# Patient Record
Sex: Female | Born: 1980 | Race: Black or African American | Hispanic: No | Smoking: Never smoker
Health system: Southern US, Community
[De-identification: ages and names within clinical notes are randomized; demographics above are authoritative.]

## PROBLEM LIST (undated history)

## (undated) DIAGNOSIS — K909 Intestinal malabsorption, unspecified: Secondary | ICD-10-CM

## (undated) DIAGNOSIS — E611 Iron deficiency: Secondary | ICD-10-CM

## (undated) DIAGNOSIS — F333 Major depressive disorder, recurrent, severe with psychotic symptoms: Principal | ICD-10-CM

## (undated) DIAGNOSIS — R1084 Generalized abdominal pain: Secondary | ICD-10-CM

## (undated) DIAGNOSIS — R109 Unspecified abdominal pain: Secondary | ICD-10-CM

## (undated) DIAGNOSIS — E617 Deficiency of multiple nutrient elements: Secondary | ICD-10-CM

## (undated) DIAGNOSIS — Z006 Encounter for examination for normal comparison and control in clinical research program: Secondary | ICD-10-CM

## (undated) DIAGNOSIS — Z9884 Bariatric surgery status: Secondary | ICD-10-CM

## (undated) DIAGNOSIS — F419 Anxiety disorder, unspecified: Secondary | ICD-10-CM

## (undated) DIAGNOSIS — A599 Trichomoniasis, unspecified: Secondary | ICD-10-CM

## (undated) DIAGNOSIS — R1111 Vomiting without nausea: Secondary | ICD-10-CM

## (undated) HISTORY — PX: CHOLECYSTECTOMY: SHX55

## (undated) HISTORY — PX: ABDOMINAL HYSTERECTOMY: SHX81

## (undated) HISTORY — PX: ECTOPIC PREGNANCY SURGERY: SHX613

## (undated) HISTORY — PX: BREAST SURGERY: SHX581

## (undated) HISTORY — PX: GASTRIC BYPASS: SHX52

---

## 2014-04-13 LAB — COMPREHENSIVE METABOLIC PANEL
ALT: 19 U/L (ref 12–78)
AST: 15 U/L (ref 15–37)
Albumin,Serum: 3.6 g/dL (ref 3.1–4.6)
Alkaline Phosphatase: 87 U/L (ref 45–117)
Anion Gap: 12
BUN: 9 mg/dL (ref 7–25)
CO2: 23 mmol/L (ref 21–32)
Calcium: 8.9 mg/dL (ref 8.2–10.1)
Chloride: 107 mmol/L (ref 98–109)
Creatinine: 1.04 mg/dL (ref 0.60–1.50)
EGFR IF NonAfrican American: >60 mL/min (ref >60)
Glucose: 102 mg/dL — ABNORMAL HIGH (ref 70–100)
Potassium: 4.1 mmol/L (ref 3.5–5.1)
Sodium: 142 mmol/L (ref 135–145)
Total Bilirubin: 0.6 mg/dL (ref 0.2–1.0)
Total Protein: 6.6 g/dL (ref 6.4–8.2)
eGFR African American: >60 mL/min (ref >60)

## 2014-04-13 LAB — LIPID PANEL
Chol/HDL Ratio: 3
Cholesterol: 137 mg/dL (ref <200)
HDL: 50 mg/dL (ref >40)
LDL ASSESSMENT: 72 mg/dL (ref <100)
Triglycerides: 75 mg/dL (ref <150)

## 2014-04-13 LAB — CBC
Hematocrit: 37.2 % (ref 35.0–47.0)
Hemoglobin: 12.1 g/dl (ref 11.7–16.0)
MCH: 28.1 pg (ref 26.0–34.0)
MCHC: 32.5 % (ref 32.0–36.0)
MCV: 86.5 fl (ref 79.0–98.0)
MPV: 9.4 fl (ref 7.4–10.4)
Platelets: 337 10*3/uL (ref 140–440)
RBC: 4.3 10*6/uL (ref 3.80–5.20)
RDW: 12.7 % (ref 11.5–14.5)
WBC: 6.5 10*3/uL (ref 3.6–10.7)

## 2014-04-13 LAB — FERRITIN: Ferritin: 57 ng/mL (ref 8–388)

## 2014-04-13 LAB — VITAMIN B12: Vitamin B-12: 325 pg/mL (ref 193–986)

## 2014-04-13 LAB — FOLATE: Folate: 74 ng/mL — ABNORMAL HIGH (ref 3.1–17.5)

## 2014-04-13 LAB — VITAMIN D 25 HYDROXY: Vit D, 25-Hydroxy: 14 ng/mL — ABNORMAL LOW (ref 30–80)

## 2014-04-13 LAB — MAGNESIUM: Magnesium: 2.1 mg/dL (ref 1.8–2.4)

## 2014-04-13 LAB — IRON: Iron: 61 ug/dL (ref 50–170)

## 2014-04-16 LAB — ZINC: ZINC, SERUM: 69 ug/dL (ref 60–120)

## 2014-09-12 ENCOUNTER — Ambulatory Visit
Admit: 2014-09-12 | Discharge: 2014-09-12 | Payer: PRIVATE HEALTH INSURANCE | Attending: Obstetrics & Gynecology | Primary: Student in an Organized Health Care Education/Training Program

## 2014-09-12 DIAGNOSIS — Z01419 Encounter for gynecological examination (general) (routine) without abnormal findings: Secondary | ICD-10-CM

## 2014-09-12 NOTE — Progress Notes (Signed)
Chief Complaint   Patient presents with   ??? Gynecologic Exam       No LMP recorded. Patient has had a hysterectomy.     History:    No past medical history on file.    Past Surgical History   Procedure Laterality Date   ??? Cesarean section  2005   ??? Hysterectomy  2012     [facility]   ??? Salpingectomy  2008     Ectopic Pregnancy   ??? Cholecystectomy  2006   ??? Gastric bypass surgery  2014   ??? Breast lumpectomy Left 2000     Benign       Family History   Problem Relation Age of Onset   ??? High Blood Pressure Mother    ??? Heart Disease Mother    ??? Thyroid Disease Mother    ??? High Blood Pressure Father    ??? Diabetes Father    ??? Mental Illness Father    ??? High Blood Pressure Sister    ??? Thyroid Disease Sister    ??? Mental Illness Sister    ??? Breast Cancer Other      Aunt       History     Social History   ??? Marital Status: Divorced     Spouse Name: N/A     Number of Children: N/A   ??? Years of Education: N/A     Social History Main Topics   ??? Smoking status: Never Smoker    ??? Smokeless tobacco: Never Used   ??? Alcohol Use: 0.0 oz/week     0 Not specified per week   ??? Drug Use: No   ??? Sexual Activity: None     Other Topics Concern   ??? None     Social History Narrative   ??? None       Allergies:    Allergies   Allergen Reactions   ??? Amoxicillin Swelling   ??? Pcn [Penicillins] Swelling       Medications:    No current outpatient prescriptions on file prior to visit.     No current facility-administered medications on file prior to visit.       HPI:    HPI Comments: Pt presents for an annual exam    Gynecologic Exam  Pertinent negatives include no abdominal pain, arthralgias, chest pain, chills, fatigue, fever, nausea or vomiting.       ROS:    Review of Systems   Constitutional: Negative for fever, chills, activity change, appetite change, fatigue and unexpected weight change.   Respiratory: Negative for apnea, shortness of breath and stridor.    Cardiovascular: Negative for chest pain and leg swelling.   Gastrointestinal: Positive for  constipation. Negative for nausea, vomiting, abdominal pain, diarrhea and abdominal distention.   Genitourinary: Positive for frequency and pelvic pain. Negative for dysuria, urgency, vaginal bleeding, vaginal discharge, difficulty urinating, menstrual problem and dyspareunia.   Musculoskeletal: Negative for back pain and arthralgias.   Allergic/Immunologic: Negative for environmental allergies and food allergies.   Neurological: Negative for facial asymmetry and speech difficulty.   Psychiatric/Behavioral: Negative for behavioral problems, confusion and agitation.     1 c/s gastric bypass  Physical exam:    Physical Exam   Constitutional: She is oriented to person, place, and time. She appears well-developed and well-nourished. No distress.   HENT:   Head: Normocephalic and atraumatic.   Eyes: Right eye exhibits no discharge. Left eye exhibits no discharge. No scleral icterus.  Neck: Normal range of motion. Neck supple. No JVD present. No tracheal deviation present. No thyromegaly present.   Cardiovascular: Normal rate.    Pulmonary/Chest: Effort normal. No stridor. No respiratory distress. She has no wheezes.   Abdominal: Soft. She exhibits no distension and no mass. There is no tenderness. There is no rebound and no guarding.   Genitourinary: Vagina normal. No breast swelling, tenderness, discharge or bleeding. There is no rash, tenderness, lesion or injury on the right labia. There is no rash, tenderness, lesion or injury on the left labia. Right adnexum displays no mass, no tenderness and no fullness. Left adnexum displays no mass, no tenderness and no fullness.   Musculoskeletal: Normal range of motion.   Lymphadenopathy:     She has no cervical adenopathy.   Neurological: She is alert and oriented to person, place, and time. She displays normal reflexes. No cranial nerve deficit. Coordination normal.   Skin: Skin is warm and dry. No rash noted. She is not diaphoretic. No erythema. No pallor.   Psychiatric:  She has a normal mood and affect. Her behavior is normal. Judgment and thought content normal.       Assessment and Plan:    Renea Eevelyn was seen today for gynecologic exam.    Diagnoses and associated orders for this visit:    Routine gynecological examination  - PAP SMEAR    Pelvic cramping  - US Non OB Transvaginal; Future      We discussed that the cramping is suprapubic.  She has it 3 times a day for a few months.  She is constipated .  She does urinate a lot.    Will get us and fu.  In the interim she willdo miralax,  If negative will get gu consult to rule out IC   Also we discussed that she could have a hernia where she points   No Follow-up on file.

## 2014-09-13 LAB — C.TRACHOMATIS N.GONORRHOEAE DNA

## 2014-09-17 ENCOUNTER — Encounter: Attending: Obstetrics & Gynecology | Primary: Student in an Organized Health Care Education/Training Program

## 2014-09-19 LAB — PAP SMEAR

## 2014-10-04 ENCOUNTER — Ambulatory Visit
Admit: 2014-10-04 | Discharge: 2014-10-04 | Payer: PRIVATE HEALTH INSURANCE | Attending: Obstetrics & Gynecology | Primary: Student in an Organized Health Care Education/Training Program

## 2014-10-04 ENCOUNTER — Ambulatory Visit
Admit: 2014-10-04 | Discharge: 2014-10-04 | Payer: PRIVATE HEALTH INSURANCE | Primary: Student in an Organized Health Care Education/Training Program

## 2014-10-04 DIAGNOSIS — R102 Pelvic and perineal pain: Secondary | ICD-10-CM

## 2014-10-04 NOTE — Progress Notes (Signed)
Chief Complaint   Patient presents with   ??? Follow-up     review us       No LMP recorded. Patient has had a hysterectomy.     History:    History reviewed. No pertinent past medical history.    Past Surgical History   Procedure Laterality Date   ??? Cesarean section  2005   ??? Hysterectomy  2012     [facility]   ??? Salpingectomy  2008     Ectopic Pregnancy   ??? Cholecystectomy  2006   ??? Gastric bypass surgery  2014   ??? Breast lumpectomy Left 2000     Benign       Family History   Problem Relation Age of Onset   ??? High Blood Pressure Mother    ??? Heart Disease Mother    ??? Thyroid Disease Mother    ??? High Blood Pressure Father    ??? Diabetes Father    ??? Mental Illness Father    ??? High Blood Pressure Sister    ??? Thyroid Disease Sister    ??? Mental Illness Sister    ??? Breast Cancer Other      Aunt       History     Social History   ??? Marital Status: Divorced     Spouse Name: N/A     Number of Children: N/A   ??? Years of Education: N/A     Social History Main Topics   ??? Smoking status: Never Smoker    ??? Smokeless tobacco: Never Used   ??? Alcohol Use: 0.0 oz/week     0 Not specified per week   ??? Drug Use: No   ??? Sexual Activity: None     Other Topics Concern   ??? None     Social History Narrative       Allergies:    Allergies   Allergen Reactions   ??? Amoxicillin Swelling   ??? Pcn [Penicillins] Swelling       Medications:    Current Outpatient Prescriptions on File Prior to Visit   Medication Sig Dispense Refill   ??? Multiple Vitamins-Minerals (MULTIVITAMIN PO) Take by mouth       No current facility-administered medications on file prior to visit.       HPI:    HPI Comments: Pt presents for us results . She had a small cyst on her right ovary ,  We discussed that her pain could be from inetrstitial cystitis or from a hernia.  Bowel movements are regular.  She will see dr ray bologna and if his workup is negative she will consult with a surgeon for hernia       ROS:    Review of Systems   Constitutional: Negative for fever, activity change,  appetite change, fatigue and unexpected weight change.   Genitourinary: Positive for pelvic pain. Negative for dysuria, urgency, frequency, flank pain, decreased urine volume, vaginal bleeding, vaginal discharge, enuresis, difficulty urinating and dyspareunia.       Physical exam:    Physical Exam   Constitutional: She is oriented to person, place, and time. She appears well-developed and well-nourished. No distress.   HENT:   Head: Normocephalic and atraumatic.   Eyes: Right eye exhibits no discharge. Left eye exhibits no discharge. No scleral icterus.   Pulmonary/Chest: Effort normal. No respiratory distress. She has no wheezes.   Abdominal: Soft. She exhibits no distension.   Neurological: She is alert and oriented to person,  place, and time.   Skin: Skin is warm and dry. No rash noted. She is not diaphoretic. No erythema. No pallor.   Psychiatric: She has a normal mood and affect. Her behavior is normal. Judgment and thought content normal.       Assessment and Plan:    [name] was seen today for follow-up.    Diagnoses and associated orders for this visit:    Pelvic cramping      S/p hyst . Normal us.  She will see dr Midge Averbologna to rule out ic  If negativce possible consult for hernia   No Follow-up on file.

## 2014-10-22 ENCOUNTER — Ambulatory Visit
Admit: 2014-10-22 | Discharge: 2014-10-22 | Payer: PRIVATE HEALTH INSURANCE | Attending: Surgery | Primary: Student in an Organized Health Care Education/Training Program

## 2014-10-22 DIAGNOSIS — R5383 Other fatigue: Secondary | ICD-10-CM

## 2014-10-22 NOTE — Progress Notes (Signed)
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 She denies nausea, vomiting, dysphagia, and reflux symptoms. Currently is not taking a PPI. Patient states diet and exercise is going well. She currently is  eating 65-75 gm/day protein, and is not compliant with prescribed multivitamins and supplements.  She is happy with her progress to date.  She is running regularly with a running group and is doing regular races to keep motivated.  She is doing strength training regularly.  She complains of a pulling sensation and pain in her right groin region x6 months, worse lately.  Unsure of what causes pain - can be while active or laying down.  Denies associated bulge.        Vital signs are stable. Labs were Not completed. All labs were: not completed      Physical Examination:   BP 125/81 mmHg   Pulse 73   Temp(Src) 97.6 ??F (36.4 ??C)   Resp 18   Ht 5' 4.5" (1.638 m)   Wt 209 lb 12.8 oz (95.165 kg)   BMI 35.47 kg/m2    General:   This patient is awake, alert, and oriented, and is in no apparent distress.    Cardiac:   Regular rate and rhythm without evidence of murmur.  Respiratory:   Clear to auscultation bilaterally.  Abdomen:   Soft, non-tender, non-distended without masses/ No evidence of abdominal hernia / Scars consistent with previous surgeries.  Right groin no evidence of inguinal or femoral hernia, no defects/abnl appreciated.   Head and Neck:  Supple  Extremities:   No cyanosis, clubbing or edema/ No calf tenderness/No restrictions of movement, is ambulatory without assistance.  Neurological:   Intact x 4 extremities, no focal deficits notes.  Skin:    No rashes or lesions noted.    No orders of the defined types were placed in this encounter.       Medications ordered during this encounter:   Outpatient Encounter Prescriptions as of 10/22/2014   Medication Sig Dispense Refill   ???  vitamin B-12 (CYANOCOBALAMIN) 1000 MCG tablet Place 1,000 mcg under the tongue once a week Indications: SUPPLEMENT     ??? zinc 50 MG CAPS Take by mouth Indications: SUPPLEMENT     ??? calcium carbonate (OSCAL) 500 MG TABS tablet Take 500 mg by mouth three times daily     ??? Multiple Vitamins-Minerals (MULTIVITAMIN PO) Take by mouth       No facility-administered encounter medications on file as of 10/22/2014.       Visit Diagnoses:   1. Other fatigue    2. SOB (shortness of breath)    3. Intestinal malabsorption, unspecified intestinal malabsorption    4. Deficiency of multiple nutrient elements    5. Deficiency of multiple vitamins      Plan:    1). Diet and Exercise: Continue current regimen.  Discussed with RD. Encouraged vitamin/supplement compliance.    2). Labs:  Not completed, orders given for draw today.   3). Psych concerns : No  4). Excessive skin concerns: Yes Refer to Dr. Odette HornsPedersen/Diulus at 18 month appointment - patient still actively losing weight.      Electronically signed by Margette FastLINDSAY BERBIGLIA, DO on 10/22/2014 at 10:37 AM      I personally supervised the Fellow in the evaluation and development of a treatment  plan for this patient. I personally interviewed the patient and performed a physical examination. In addition, I discussed the patient's condition and treatment options with them. I have also reviewed and agree with the past medical, family and social history unless otherwise noted. All of the patient's questions were answered.     Exercising well  No nausea  Will refer to plastic surgery at next visit

## 2014-10-22 NOTE — Progress Notes (Signed)
[facility]    > 6 MONTH POST-OPERATIVE DIETITIAN VISIT    Date: 10/22/2014    Patient's weight decreased by: 84 lbs     Maintenance diet reviewed with patient.       Patient does consume 5-6 small meals daily  Patient???s portions are adequate for current diet: ??-1 cup  Protein requirements discussed- currently consuming 60-80 grams/day grams daily.  Current protein sources: doing good, will monitor  Recommendations: will track      Fluid requirements discussed. Current Fluid Intake:  >64 oz/day  Patient  is  drinking sugar-free, caffeine-free and carbonation-free fluids only.    Patient is  waiting 30 minutes before and after meals to drink      Exercise activities discussed. Patient is  currently exercising. She was reminded that regular exercise is critical part of a successful outcome following weight loss surgery.       Behavioral/Emotional changes reviewed. Patient does feel comfortable with changes in eating behaviors and associated emotional changes. She was reminded that psychological counseling is available through the [facility] post-operatively.    The importance of vitamin supplements has been reviewed and the patient is taking the following:  -Multivitamin with minerals and iron  -Calcium  -Vitamin B12 (weekly)  -Vitamin D3  -Other:    Recent Nutrient Concerns and Vitamin Supplementation Changes: none- pt not taking vitamins    Notes/Comments: Pt is doing great overall! Pt to call as needed with any issues or concerns that arise.  Pt running 5 k's and 1/2 marathons      Visit completed by: Betsy CoderLaura Asna Muldrow

## 2014-10-22 NOTE — Progress Notes (Signed)
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 [phone])    Diet & Exercise Compliance:  Compliance with Recommended Eating Plan:  Eating 75g protein per day  yes    If no, reason:  Drinking at least 64 ounces of water per day no   If no, reason:Most days  Avoiding caffeine, added sugar, and carbonation no   If no, reason:using all    Compliance with Recommended Exercise Plan:  Exercising:    yes       If no, reason:  n/a      If yes:         Type:   hip hop class          Times per week:    3 times         Min per session:    60 minutes/day    Falls Risk Assessment  Patient does not not take medications which affect BP or mental status   Patient does notnot have newly prescribed or changed dosage of medications within past 30 days which affect BP or mental status  Patient has notfallen in the past 2 months  Patient does not demonstrate unsteady gait  Patient uses the following ambulatory assistive devices: n/a  Patient states the presence of the following traits which increases risk of fall: n/a    Patient is low risk for falls. If high or moderate risk, patient instructed not to ambulate independently in the Center, and cord for call light placed within reach of patient.    Pre-op Weight Metrics:  Surgical Weight Loss Tracker 10/16/2014   Consult Date 12/26/2012   Initial Weight 294 lb   Initial BMI 49.79   Ideal Body Weight 131 lb   Surgery Date 10/09/2013   Pre-Surgical Weight 294 lb   Pre Surgery BMI 49.79   Weight to Lose 163 lb       Post-op Weight Metrics:   %EBWL: % EBWL: 52%  Weight Change Since Last Visit: Weight Change: -2 lbs  Weight Change from Highest Pre-op Weight: Total Weight Change: -84.2 lbs    Labs Completed: no -   If no, patient instructed to get labs drawn today or ASAP    [facility]? N/A    If yes see Labs Tab    [facility]A    If yes see Encounters Tab - Orders only - Historical Provider - Date: n/a    Completed by: Merlyn AlbertLeighann Sanchez Hemmer

## 2015-01-06 NOTE — ED Provider Notes (Signed)
PATIENTSHARIAN, [name]       DOS:           01/06/2015  MR #:             [id number]             ACCOUNT #:     [id number]  DATE OF BIRTH:    04-30-1981              AGE:           34      HISTORY OF PRESENT ILLNESS:    PERTINENT HISTORY OF PRESENT  ILLNESS. Patient seen under the  supervision of  Dr. Maurine Minister DeJulius.  Patient presents complaining of right lower  dental pain.  This has been worsening over the past few days.  She does not have a  dentist.  She states that she has started to have a headache as well.  No  documented  fever or chills.    PERTINENT PAST/ FAMILY/SOCIAL HISTORY History of cholecystectomy  and  hysterectomy.  History of gastric bypass.        PHYSICAL EXAM Vital signs normal.  Patient is alert and oriented x3  and in no  acute distress.  Head is normocephalic.  Patient has right posterior  molar  tenderness to palpation.  No abscess.  No sublingual edema.  Airway is  patent.  Heart is a regular rate and rhythm.  Lungs are clear to auscultation  bilaterally.    MEDICAL DECISION MAKING:    SIGNIFICANT FINDINGS/ED COURSE/MEDICAL DECISION MAKING/TREATMENT  PLAN Patient  has right posterior molar tenderness on exam.  No abscess.  No  sublingual  edema.  Patient was treated with clindamycin and Norco due to her  history of  gastric bypass.  She will be given clindamycin and #8 Norco for home.  She was  given dental resource list and followup to the dental clinic.  Plan  was shared  with the patient and all questions were answered.  Patient discharged  in stable  condition.    PROBLEM LIST:       ED Diagnosis:     Pain, dental (K08.8): Entered Date: 06-Jan-2015 20:49, Entered By:  Heloise Ochoa CHASE, Status: Active, ICD-10: K20.8        ADDITIONAL INFORMATION If the physician assistant/nurse practitioner  was  involved in patient care, I personally performed and participated in  all the  above services (including HPI and PE). I have reviewed with  the  physician  assistant/nurse practitioner the history and confirmed the findings  with the  patient. I personally performed all surgical procedures in the medical  record  unless otherwise indicated.      COPIES SENT TO::     Odie Sera L(PCP): [id number]    Electronic Signatures:  Benson Norway (MD)  (Signed 06-Jan-2015 21:05)   Co-Signer: HISTORY OF PRESENT ILLNESS, PHYSICAL EXAM, MEDICAL  DECISION MAKING,  PROBLEM LIST, Additional Infomation, Copies to be sent to:  Heloise Ochoa CHASE (PA)  (Signed 06-Jan-2015 20:50)   Authored: HISTORY OF PRESENT ILLNESS, PHYSICAL EXAM, MEDICAL DECISION  MAKING,  PROBLEM LIST, Additional Infomation, Copies to be sent to:      Last Updated: 06-Jan-2015 21:05 by Benson Norway (MD)            Please see T-Sheet, initial assessment, and physician orders for  further details.    Dictating Physician: Dallas Breeding,  PA-C  Original Electronic Signature Date: 01/06/2015 08:50 P  SCD  Document #: [id number]    cc:  Odie Serahristina * Harner, DO

## 2015-01-07 ENCOUNTER — Inpatient Hospital Stay
Admit: 2015-01-07 | Discharge: 2015-01-07 | Disposition: A | Discharge status: Home / Self Care | Attending: Emergency Medicine

## 2015-02-17 NOTE — ED Provider Notes (Signed)
PATIENTMALIYA, [name]       DOS:           02/17/2015  MR #:             [id number]             ACCOUNT #:     [id number]  DATE OF BIRTH:    1981-03-26              AGE:           33      HISTORY OF PRESENT ILLNESS:    PERTINENT HISTORY OF PRESENT  ILLNESS. Patient seen with Dr.  Jacqualin Combes.  Patient presents with myalgias, diarrhea, nausea, vomiting and fever  of 102.7  for the past 3 days.  She was around a friend who is also sick.  She  has had  decreased p.o. over this period of time.  Patient did not get a flu  shot this  year.        PHYSICAL EXAM Vital Signs: reviewed and within expected limits  unless noted:  Temperature 102.3, heart rate 116  Gen:  WN, WD  Head: NC/AT  Eyes: pupils equal and reactive, EOMI  ENT:  mmm, no meningismus  Neck: Supple, nontender  Cardio: Tachycardic, no murmurs  Pulm: CTA-B, no rhonchi/wheezes/stridor, no distress  Abd: soft, non-tender, non-distended  Back: non-tender  Ext: symmetric pulses b/l UE/LE,  no edema, nontender  Skin: normal color, no rash  Neuro: alert and oriented x3. CNII-XII grossly intact. normal strength  and  sensation.    MEDICAL DECISION MAKING:    SIGNIFICANT FINDINGS/ED COURSE/MEDICAL DECISION MAKING/TREATMENT  PLAN Patient  given 2 L of fluids, Zofran and Tylenol.  On reevaluation she is  feeling much  improved.  Chest x-ray is negative for pneumonia.  Patient is outside  of the  Tamiflu window and considered low risk.  She will be discharged with  Norco and  Zofran.  I discussed aggressive fluid hydration. Patient will be  discharged  home in stable condition.  We discussed signs and symptoms which would  warrant  a return to the emergency department.  All questions were answered.  Patient is  agreeable with this course.    PROBLEM LIST:       ED Diagnosis:     Influenza-like symptoms (R68.89): Entered Date: 17-Feb-2015 19:46,  Entered  By: Roxan Hockey, Vergia Chea D, Status: Active, ICD-10: R68.89        ADDITIONAL INFORMATION The Emergency  Medicine attending physician  was present  in the Emergency Department, who reviewed case management, and  approved  evaluation/treatment. Dragon dictation software was used when creating  this  note; there may be translation errors.      COPIES SENT TO::     Odie Sera L(PCP): [id number]    Electronic Signatures:  Benson Norway (MD)  (Signed 17-Feb-2015 19:47)   Co-Signer: HISTORY OF PRESENT ILLNESS, PHYSICAL EXAM, MEDICAL  DECISION MAKING,  PROBLEM LIST, Additional Infomation, Copies to be sent to:  Aaliyah Cancro D (DO)  (Signed 17-Feb-2015 19:46)   Authored: HISTORY OF PRESENT ILLNESS, PHYSICAL EXAM, MEDICAL DECISION  MAKING,  PROBLEM LIST, Additional Infomation, Copies to be sent to:      Last Updated: 17-Feb-2015 19:47 by Benson Norway (MD)            Please see T-Sheet, initial assessment, and physician orders for  further details.    Dictating Physician:  Thomasena EdisEric Othman Masur, DO  Original Electronic Signature Date: 02/17/2015 07:46 P  ER  Document #: [id number]    cc:  Odie Serahristina * Harner, DO

## 2015-04-15 LAB — FOLATE: Folate: 10.4 ng/mL (ref 3.1–17.5)

## 2015-04-15 LAB — CBC
Hematocrit: 39 % (ref 35.0–47.0)
Hemoglobin: 12.6 g/dL (ref 11.7–16.0)
MCH: 28.7 pg (ref 26.0–34.0)
MCHC: 32.2 % (ref 32.0–36.0)
MCV: 89.1 fL (ref 79.0–98.0)
MPV: 9.8 fL (ref 7.4–10.4)
Platelets: 273 10*3/uL (ref 140–440)
RBC: 4.38 10*6/uL (ref 3.80–5.20)
RDW: 13.4 % (ref 11.5–14.5)
WBC: 4.1 10*3/uL (ref 3.6–10.7)

## 2015-04-15 LAB — VITAMIN B12: Vitamin B-12: 223 pg/mL (ref 193–986)

## 2015-04-15 LAB — LIPID PANEL
Cholesterol: 170 mg/dL (ref <200)
HDL: 3 NA
HDL: 65 mg/dL — ABNORMAL HIGH (ref 40–59)
LDL Cholesterol: 87 mg/dL (ref <100)
Triglycerides: 89 mg/dL (ref <150)

## 2015-04-15 LAB — MAGNESIUM: Magnesium: 2.2 mg/dL (ref 1.8–2.4)

## 2015-04-15 LAB — FERRITIN: Ferritin: 31 ng/mL (ref 8–252)

## 2015-04-16 LAB — VITAMIN D 25 HYDROXY: Vit D, 25-Hydroxy: <13 ng/mL — ABNORMAL LOW (ref 30–80)

## 2015-04-18 LAB — ZINC: ZINC, SERUM: 66 ug/dL (ref 60–120)

## 2015-04-30 ENCOUNTER — Encounter: Attending: Internal Medicine | Primary: Student in an Organized Health Care Education/Training Program

## 2015-07-02 NOTE — ED Provider Notes (Signed)
PATIENTSHARDAY, [name]       DOS:           07/02/2015  MR #:             [id number]             ACCOUNT #:     [id number]  DATE OF BIRTH:    01-18-1981              AGE:           34      HISTORY OF PRESENT ILLNESS:    PERTINENT HISTORY OF PRESENT  ILLNESS. Patient presents with lower  pelvic  cramping she has had for several months.  She has not been seen by her  primary  provider or her GYN physician.  She denies any vaginal discharge or  bleeding.  She has had a prior hysterectomy.  She is does describe some dysuria  and  urinary frequency, no hematuria.  She has some mild nausea at times  but not  currently.  She appears well and in no acute distress.  She has no  other  complaints.  She denies any new recent sexual partners.    PERTINENT PAST/ FAMILY/SOCIAL HISTORY Hysterectomy        PHYSICAL EXAM Patient appears in no acute distress.  Neck is  supple.  Cardiovascular exam is regular rate and rhythm without murmurs.  Respiratory  exam is clear to auscultation bilaterally.  Abdomen was soft,  nontender, and  nondistended.  Extremities are nontender without edema.  Neurological  exam was  grossly unremarkable without lateralizing signs.  Remainder of  physical  examination is unremarkable.    MEDICAL DECISION MAKING:    SIGNIFICANT FINDINGS/ED COURSE/MEDICAL DECISION MAKING/TREATMENT  PLAN Patient  was given Toradol injection here with improvement of pain.  Abdominal  examination is benign.  Urinalysis not consistent with infection.  HCG  was  negative.  I feel patient should follow up with her primary provider  her GYN to  obtain outpatient ultrasound for further evaluation of her symptoms  which have  been going on for several months.  I do not feel she has any evidence  of acute  abdominal or pelvic surgical process at this time.  She will be given  prescription for Norco and return with any new or worsening symptoms.  Patient  understands and is agreeable with this plan of care.  Patient  was  discharged  home in stable condition.    PROBLEM LIST:       ED Diagnosis:     Pelvic pain (R10.2): Entered Date: 02-Jul-2015 22:33, Entered By:  Theodoro Kos, Status: Active, ICD-10: R10.2       Admit Reason:     pelvic pain: Entered Date: 02-Jul-2015 21:19, Entered By:  Standley Brooking, Status: Active        DIAGNOSIS Pelvic pain        ADDITIONAL INFORMATION This report was transcribed using computer  voice  recognition software.  Effort was made to ensure accuracy, however,  inadvertent  transcription errors may occur.      COPIES SENT TO::     Odie Sera L(PCP): [id number]    Electronic Signatures:  Theodoro Kos (MD)  (Signed 02-Jul-2015 22:33)   Authored: HISTORY OF PRESENT ILLNESS, PHYSICAL EXAM, MEDICAL DECISION  MAKING,  PROBLEM LIST, DIAGNOSIS, Additional Infomation, Copies to be sent to:  Last Updated: 02-Jul-2015 22:33 by Theodoro Kos (MD)            Please see T-Sheet, initial assessment, and physician orders for  further details.    Dictating Physician: Theodoro Kos, MD  Original Electronic Signature Date: 07/02/2015 10:33 P  CMF  Document #: [id number]    cc:  Odie Sera, DO

## 2015-07-03 ENCOUNTER — Inpatient Hospital Stay: Admit: 2015-07-03 | Discharge: 2015-07-03 | Disposition: A | Discharge status: Home / Self Care

## 2015-07-03 LAB — URINALYSIS
Bilirubin, Urine: NEGATIVE NA
Glucose, Ur: NEGATIVE mg/dL
Ketones, Urine: NEGATIVE mg/dL
LEUKOCYTES, UA: NEGATIVE NA
Nitrite, Urine: NEGATIVE NA
Occult Blood,Urine: NEGATIVE {RBC}/uL
Specific Gravity, Urine: 1.015 NA (ref 1.005–1.030)
Total Protein, Urine: NEGATIVE mg/dL
Urobilinogen, Urine: 4 mg/dL (ref 0–1)
pH, Urine: 7 NA (ref 5.0–8.0)

## 2015-07-03 LAB — PREGNANCY, URINE: HCG Urine: NEGATIVE NA

## 2015-07-03 NOTE — ED Provider Notes (Signed)
[name]:          [name], [name]       DOS:           07/02/2015  MR #:             [id number]             ACCOUNT #:     [id number]  DATE OF BIRTH:    1981-06-11              AGE:           34      HISTORY OF PRESENT ILLNESS:    PERTINENT HISTORY OF PRESENT  ILLNESS. This pt was seen and  evaluated with  Dr. Primitivo GauzeFletcher.  Patient presents to Emergency Department with pelvic pain for the past  2  months.  She describes it as cramping in nature and nothing makes it  better or  worse.  She denies vaginal bleeding, discharge, dysuria, hematuria.  She does  have urinary frequency.  She is sexually active with a single partner.  She had  a hysterectomy in 2012.  She denies chills, sweats, fever, vomiting,  diarrhea.  She has no point with OB/GYN for September 27.  [facility] to coming here.  Patient had an  unremarkable  urinalysis and was given Toradol for her pain.  She was instructed to  follow up  with OB/GYN for an ultrasound.  The patient came here because she is  wanting to  get blood work.    PERTINENT PAST/ FAMILY/SOCIAL HISTORY PMH: denies  PSH: Hysterectomy, gastric bypass  social hx: Tobacco use        PHYSICAL EXAM Gen: Well nourished, well developed, afebrile, no  acute  distress  HEENT: Normocephalic, atraumatic, moist mucous membranes, supple neck  CVS: Regular rate and rhythm  Resp: No acute respiratory distress, clear to auscultation  bilaterally, no  rhonchi/wheezing/rales  Abd: Soft, nontender, nondistended, normal bowel sounds  Back: no CVA tenderness, no spinal tenderness  Ext: No trauma, no edema  Neuro: Alert and oriented.  No focal deficits    MEDICAL DECISION MAKING:    SIGNIFICANT FINDINGS/ED COURSE/MEDICAL DECISION MAKING/TREATMENT  PLAN Patient  was [place].  At  that time she had an unremarkable urinalysis and was given Toradol.  She was  also given Norco for home and told to  follow-up with OB/GYN for an  ultrasound.  This was a patient that there is no need for blood work at this time.  She has  been having this pain for over 2 months and denies vaginal bleeding  and  discharge.  Patient discharged home in stable condition and told to  follow-up  with OB/GYN as planned.    Patient's pain has been going on for 2 months.  This time I see no  indication  for emergent ultrasound.  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  I  explained to her that this likely could be a cyst but she does not  appear to  have symptoms of torsion.  I do not feel like she needs an emergent  ultrasound.   If she had ovarian cyst on ultrasound her treatment would be the  same.    PROBLEM LIST:       ED Diagnosis:     Pelvic pain (R10.2): Entered Date: 03-Jul-2015  00:07, Entered By:  Otilio Connors, Status: Active, ICD-10: R10.2        DIAGNOSIS pelvic pain        ADDITIONAL INFORMATION If the physician assistant/nurse practitioner  was  involved in patient care, I personally performed and participated in  all the  above services (including HPI and PE). I have reviewed with the  physician  assistant/nurse practitioner the history and confirmed the findings  with the  patient. I personally performed all surgical procedures in the medical  record  unless otherwise indicated.      COPIES SENT TO::     Odie Sera L(PCP): [id number]    Electronic Signatures:  Cheri Rous (MD)  (Signed 03-Jul-2015 00:36)   Authored: MEDICAL DECISION MAKING   Co-Signer: HISTORY OF PRESENT ILLNESS, PROBLEM LIST, Copies to be  sent to:  Otilio Connors (PA)  (Signed 03-Jul-2015 00:07)   Authored: HISTORY OF PRESENT ILLNESS, PHYSICAL EXAM, MEDICAL DECISION  MAKING,  PROBLEM LIST, DIAGNOSIS, Additional Infomation, Copies to be sent to:      Last Updated: 03-Jul-2015 00:36 by Cheri Rous (MD)            Please see Warner-Sheet, initial assessment, and physician orders for  further details.    Dictating  Physician: Aida Raider, PA  Original Electronic Signature Date: 07/03/2015 12:03 A  MS  Document #: [id number]    cc:  Odie Sera, DO

## 2015-07-18 ENCOUNTER — Telehealth

## 2015-07-18 NOTE — Telephone Encounter (Signed)
Patient needs labwork order for 24 month LRYGB visit.  Will send to patient in the mail.    Thanks,    [employer]

## 2015-07-18 NOTE — Telephone Encounter (Signed)
Mailed patient labwork orders.    [name]

## 2015-07-18 NOTE — Telephone Encounter (Signed)
Signed.

## 2015-09-03 ENCOUNTER — Ambulatory Visit
Admit: 2015-09-03 | Discharge: 2015-09-03 | Payer: BLUE CROSS/BLUE SHIELD | Attending: Internal Medicine | Primary: Student in an Organized Health Care Education/Training Program

## 2015-09-03 DIAGNOSIS — E617 Deficiency of multiple nutrient elements: Secondary | ICD-10-CM

## 2015-09-03 NOTE — Progress Notes (Signed)
[facility]  SURGICAL WEIGHT LOSS MANAGEMENT PROGRAM    MEDICAL WEIGHT LOSS MANAGEMENT FOLLOWING SURGICAL WEIGHT LOSS  INITIAL EVALUATION      Patient: [name]          Service Date: 09/03/15         Date of Birth: Oct 08, 1981      Patient History/Assessment Summary:  The patient is a pleasant  34 y.o. year old female, who stands Height: 5' 4.5" (163.8 cm) (bcc)  tall with a weight of Weight: 212 lb 3.2 oz (96.3 kg) pounds, resulting in a BMI of Body mass index is 35.86 kg/(m^2). kg/m2. She has been overweight for 30 years, and who has previously undergone weight loss surgery. She is now seeking medical weight loss management following weight loss surgery to address struggles with regain and/or to lose additional weight. Based upon BMI, she  is currently experiencing Obesity.     This patient???s excess weight is causing:Degenerative Joint Disease (DJD)    This patient is alone for the evaluation today.    ROS:  I reviewed page 5 of the New Patient Assessment form with the patient, which is located in the Media Manager tab      History:    Past Medical History   Diagnosis Date   ??? Anemia    ??? Chronic back pain    ??? Deficiency of multiple nutrient elements    ??? Deficiency of multiple vitamins    ??? Fatigue    ??? GERD (gastroesophageal reflux disease)    ??? Intestinal malabsorption    ??? Irregular menstrual cycle    ??? Knee pain    ??? SOB (shortness of breath)      Past Surgical History   Procedure Laterality Date   ??? Cesarean section  2005   ??? Hysterectomy  2012     LAVH   ??? Salpingectomy  2008     Ectopic Pregnancy   ??? Cholecystectomy  2006   ??? Gastric bypass surgery  2014     [id number]   ??? Breast lumpectomy Left 2000     Benign     Family History   Problem Relation Age of Onset   ??? High Blood Pressure Mother    ??? Heart Disease Mother    ??? Thyroid Disease Mother    ??? High Blood Pressure Father    ??? Diabetes Father    ??? Mental Illness Father    ??? High Blood Pressure Sister    ??? Thyroid Disease Sister     ??? Mental Illness Sister    ??? Breast Cancer Other      Aunt     Social History   Substance Use Topics   ??? Smoking status: Never Smoker   ??? Smokeless tobacco: Never Used   ??? Alcohol use 0.0 oz/week     0 Standard drinks or equivalent per week       Diagnoses:     Diagnosis Stable/Unstable Currently being Managed by:   Anxiety     Arthritis     Asthma     Back Pain     COPD     Depression     Diabetes Mellitus     Fatigue     GERD     Hair Loss     Heart Burn     Hyperlipidemia     Hypertension     Hypothyroid     Joint Pain     Obstructive Sleep Apnea  Shortness of Breath     Other: malabsorption stable Dr Jamie Katoharner        Physical Examination:    Visit Vitals   ??? BP 122/80   ??? Pulse 78   ??? Resp 16   ??? Ht 5' 4.5" (1.638 m)  Comment: bcc   ??? Wt 212 lb 3.2 oz (96.3 kg)   ??? BMI 35.86 kg/m2       Weight Metrics:  Ideal Body Weight:    Excess Body Weight:    Ideal BMI: 28    General: This patient is alert and oriented X3    Physical Examination:   Visit Vitals   ??? BP 122/80   ??? Pulse 78   ??? Resp 16   ??? Ht 5' 4.5" (1.638 m)  Comment: bcc   ??? Wt 212 lb 3.2 oz (96.3 kg)   ??? BMI 35.86 kg/m2       General:   This patient is Non-obese, and is in no apparent distress.    Cardiac:   Regular rate and rhythm without evidence of murmur  Respiratory:   Clear to auscultation bilaterally; No evidence of respiratory distress  Ears,Nose,Mouth, Throat:  Ears and nose symmetrical in appearance and without lesions or masses; Lips, teeth, and gums WNL  Abdomen:   Soft, non-tender, non-distended without masses/ No evidence of abdominal hernia / Incisions consistent with previous surgeries.  Head and Neck:  Obese, normocephalic and atraumatic/soft and supple; no lymphadenopathy or obvious bruits; no thyromegaly.  Musculoskeletal:  No cyanosis, clubbing or edema/ No calf tenderness/No restrictions of movement, is ambulatory without assistance; does not present with back pain, without limitation of movement  Neurological:   Intact x 4  extremities, no focal deficits notes.  Skin:    Warm and dry; No rashes or lesions noted.   Rectal:   Deferred  Psychological: Patient is awake, alert and oriented to person, place and time     Patient's mood is Appropriate      Current Diet     This patient???s current diet GN:FAOZis:high in sugar and high in wheat    Reviewed  CURRENT DIET HISTORY with patient and provided a notebook for them to record their meals on a daily basis. Instructed patient on methods for documenting meals.    Her diet contains inadequate amounts of protein, inadequate amounts of healthy fats, adequate amounts of green, leafy vegetables, and adequate amounts of fruits.    Her comfort foods include:sweets and breads    Current Activity  This patient currently does exercise for  16-30 min per session,  1-2 times per week, including the following:  walking.    Current Eating Behaviors  Reviewed the Impact on Life Questionnaire with patient; in which she describes the impact of obesity on her life. (in Careers information officerMedia Manager)      This patients demonstrates the following behaviors as they relate to her eating:eats at night    She eats approximately 1-2 times per day.  Her last meal/snack was at 8 AM.    Plan:    Advised patient that continued physician-supervised weight loss management is recommended at this time to work on weight loss.    Physician Diet Recommendations given to patient    In addition, she has been advised to:  1. Attend all physician follow up appointments - bring completed food diary to first physician follow up appointment        2.  Schedule FU visit in 2 month(s)  Goals:  3 month weight goal: 15  6 month weight goal: 30  12 month weight goal: 30     Consultations:    Other  Consultations:None    [   ]  Psychological evaluation    [   ]  Dietitian appointment      Current Meds  Patient's Medications   New Prescriptions    No medications on file   Previous Medications    CALCIUM CARBONATE (OSCAL) 500 MG TABS TABLET    Take 500 mg by  mouth three times daily    MULTIPLE VITAMINS-MINERALS (MULTIVITAMIN PO)    Take by mouth    VITAMIN B-12 (CYANOCOBALAMIN) 1000 MCG TABLET    Place 1,000 mcg under the tongue once a week Indications: SUPPLEMENT    ZINC 50 MG CAPS    Take by mouth Indications: SUPPLEMENT   Modified Medications    No medications on file   Discontinued Medications    No medications on file

## 2015-09-03 NOTE — Progress Notes (Signed)
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 [phone]    Patient has the following questions: NONE    Diet & Exercise Compliance:  Compliance with Recommended Eating Plan:  Eating 75g protein per day  no    If no, reason: 40G   Drinking at least 64 ounces of water per day no   If no, reason:20 G  Avoiding caffeine, added sugar, and carbonation no   If no, reason: CAFFEINE, ADDED SUGAR, AND CARBONATION     Compliance with Recommended Exercise Plan:  Exercising:    yes       If no, reason:        If yes:         Type:   SOME CLASSES, JUST JOINED THE GYM          Times per week:    twice         Min per session:    45 minutes/day    Falls Risk Assessment  Patient does not  take medications which affect BP or mental status   Patient does not have newly prescribed or changed dosage of medications within past 30 days which affect BP or mental status  Patient has not fallen in the past 2 months; DOES HAVE SOME DIZZINESS   Patient does not demonstrate unsteady gait  Patient uses the following ambulatory assistive devices: NONE  Patient states the presence of the following traits which increases risk of fall: NONE    Patient is low risk for falls. If high or moderate risk, patient instructed not to ambulate independently in the Center, and cord for call light placed within reach of patient.    Pre-op Weight Metrics:  Date of Initial Surgical Consultation: Consult Date: 12/26/12  Initial Weight: Initial Weight: 294 lb (133.4 kg)  Initial BMI: Initial BMI: 49.79  Ideal Body Weight: Ideal Body Weight: 131 lb (59.4 kg)  Highest Pre-Op Weight: Pre-Surgical Weight: 294 lb (133.4 kg)  Highest Pre-Op BMI: Pre Surgery BMI: 49.79  Excess Body Weight: Weight to Lose: 163 lb    Post-op Weight Metrics:   %EBWL: % EBWL: 50%  Weight Change Since Last Visit: Weight Change:  2.4 lbs  Weight Change from Highest Pre-op Weight: Total Weight Change: -81.8 lbs    Labs Completed: no -   If NO, patient instructed to get labs drawn today or ASAP    If YES:    [facility]A    If yes see Labs Tab    [facility]? N[facility]A    If yes see Encounters Tab - Orders only - Historical Provider - Date:  LABS PRINTED AND GIVEN TO PATIENT         Completed by: Lorane GellMarlene Deral Schellenberg

## 2015-09-07 LAB — COMPREHENSIVE METABOLIC PANEL
ALT: 14 U/L (ref 12–78)
AST: 10 U/L — ABNORMAL LOW (ref 15–37)
Albumin,Serum: 3.3 g/dL — ABNORMAL LOW (ref 3.4–5.0)
Alkaline Phosphatase: 70 U/L (ref 45–117)
Anion Gap: 8 NA
BUN: 11 mg/dL (ref 7–25)
CO2: 25 mmol/L (ref 21–32)
Calcium: 8.3 mg/dL (ref 8.2–10.1)
Chloride: 109 mmol/L (ref 98–109)
Creatinine: 0.83 mg/dL (ref 0.55–1.40)
EGFR IF NonAfrican American: >60 mL/min (ref >60)
Glucose: 78 mg/dL (ref 70–100)
Potassium: 4.2 mmol/L (ref 3.5–5.1)
Sodium: 142 mmol/L (ref 135–145)
Total Bilirubin: 0.4 mg/dL (ref 0.2–1.0)
Total Protein: 6.4 g/dL (ref 6.4–8.2)
eGFR African American: >60 mL/min (ref >60)

## 2015-09-07 LAB — LIPID PANEL
Chol/HDL Ratio: 2 NA
Cholesterol: 152 mg/dL (ref <200)
HDL: 65 mg/dL — ABNORMAL HIGH (ref 40–59)
LDL Cholesterol: 75 mg/dL (ref <100)
Triglycerides: 61 mg/dL (ref <150)

## 2015-09-07 LAB — VITAMIN D 25 HYDROXY: Vit D, 25-Hydroxy: <13 ng/mL — ABNORMAL LOW (ref 30–80)

## 2015-09-07 LAB — FOLATE: Folate: 11.7 ng/mL (ref 3.1–17.5)

## 2015-09-07 LAB — CBC
Hematocrit: 36.6 % (ref 35.0–47.0)
Hemoglobin: 11.8 g/dL (ref 11.7–16.0)
MCH: 28.1 pg (ref 26.0–34.0)
MCHC: 32.2 % (ref 32.0–36.0)
MCV: 87.5 fL (ref 79.0–98.0)
MPV: 8.7 fL (ref 7.4–10.4)
Platelets: 337 10*3/uL (ref 140–440)
RBC: 4.19 10*6/uL (ref 3.80–5.20)
RDW: 12.2 % (ref 11.5–14.5)
WBC: 4.7 10*3/uL (ref 3.6–10.7)

## 2015-09-07 LAB — VITAMIN B12: Vitamin B-12: 238 pg/mL (ref 193–986)

## 2015-09-07 LAB — IRON: Iron: 71 ug/dL (ref 50–170)

## 2015-09-07 LAB — MAGNESIUM: Magnesium: 2.2 mg/dL (ref 1.8–2.4)

## 2015-09-07 LAB — FERRITIN: Ferritin: 45 ng/mL (ref 8–252)

## 2015-09-10 LAB — ZINC: ZINC, SERUM: 56 ug/dL — ABNORMAL LOW (ref 60–120)

## 2015-09-10 NOTE — Telephone Encounter (Signed)
-----   Message from Mora BellmanLeisa R Bridle, NP sent at 09/09/2015  4:07 PM EST -----  Please supplement Vit D per protocol

## 2015-09-11 LAB — VITAMIN B1, WHOLE BLOOD: Vitamin B1,Whole Blood: 51 nmol/L — ABNORMAL LOW (ref 70–180)

## 2015-09-11 NOTE — Telephone Encounter (Signed)
Spoke with patient regarding abnormal lab results. To start 50 mg of zinc daily. To start 50,000 international units of vitamin D weekly. Updated med list. Can you please send Rx to pharmacy? Thank you!

## 2015-11-18 ENCOUNTER — Encounter: Attending: Internal Medicine | Primary: Student in an Organized Health Care Education/Training Program

## 2016-01-23 ENCOUNTER — Inpatient Hospital Stay
Admission: EM | Admit: 2016-01-23 | Discharge: 2016-01-28 | Disposition: A | Discharge status: Home / Self Care | Admitting: Psychiatry

## 2016-01-23 LAB — COMPREHENSIVE METABOLIC PANEL
ALT: 24 U/L (ref 12–78)
AST: 16 U/L (ref 15–37)
Albumin,Serum: 3.8 g/dL (ref 3.4–5.0)
Alkaline Phosphatase: 72 U/L (ref 45–117)
Anion Gap: 11 NA
BUN: 6 mg/dL — ABNORMAL LOW (ref 7–25)
CO2: 25 mmol/L (ref 21–32)
Calcium: 8.5 mg/dL (ref 8.2–10.1)
Chloride: 107 mmol/L (ref 98–109)
Creatinine: 1.12 mg/dL (ref 0.55–1.40)
EGFR IF NonAfrican American: 55.5 mL/min (ref >60)
Glucose: 87 mg/dL (ref 70–100)
Potassium: 3.4 mmol/L — ABNORMAL LOW (ref 3.5–5.1)
Sodium: 143 mmol/L (ref 135–145)
Total Bilirubin: 0.4 mg/dL (ref 0.2–1.0)
Total Protein: 7.3 g/dL (ref 6.4–8.2)
eGFR African American: >60 mL/min (ref >60)

## 2016-01-23 LAB — URINE DRUG SCREEN
Amphetamines, urine: NEGATIVE NA
Barbiturates, Urine: NEGATIVE NA
Benzodiazepine Ur Qual: NEGATIVE NA
Cocaine Metabolites, Ur: NEGATIVE NA
Methadone, Urine: NEGATIVE NA
Opiates, Urine: NEGATIVE NA
Oxycodone Screen, Ur: NEGATIVE NA
PCP, Urine: NEGATIVE NA

## 2016-01-23 LAB — CBC WITH AUTO DIFFERENTIAL
Absolute Baso #: 0 10*3/uL (ref 0.0–0.2)
Absolute Eos #: 0.2 10*3/uL (ref 0.0–0.5)
Absolute Lymph #: 2.1 10*3/uL (ref 1.0–4.3)
Absolute Mono #: 0.3 10*3/uL (ref 0.0–0.8)
Absolute Neut #: 1.8 10*3/uL (ref 1.8–7.0)
Basophils: 0.8 %
Eosinophils: 3.5 %
Granulocytes %: 41.4 %
Hematocrit: 40 % (ref 35.0–47.0)
Hemoglobin: 13 g/dL (ref 11.7–16.0)
Lymphocyte %: 47.8 %
MCH: 28.6 pg (ref 26.0–34.0)
MCHC: 32.5 % (ref 32.0–36.0)
MCV: 87.9 fL (ref 79.0–98.0)
MPV: 8.6 fL (ref 7.4–10.4)
Monocytes: 6.5 %
Platelets: 330 10*3/uL (ref 140–440)
RBC: 4.55 10*6/uL (ref 3.80–5.20)
RDW: 12.4 % (ref 11.5–14.5)
WBC: 4.5 10*3/uL (ref 3.6–10.7)

## 2016-01-23 LAB — URINALYSIS
Bilirubin, Urine: NEGATIVE NA
Ketones, Urine: NEGATIVE mg/dL
LEUKOCYTES, UA: NEGATIVE NA
Nitrite, Urine: NEGATIVE NA
Occult Blood,Urine: NEGATIVE {RBC}/uL
Specific Gravity, Urine: 1.005 NA (ref 1.005–1.030)
Total Protein, Urine: NEGATIVE mg/dL
pH, Urine: 5 NA (ref 5.0–8.0)

## 2016-01-23 LAB — SALICYLATE LEVEL: Salicylate Lvl: <1.7 mg/dL (ref 0.0–20.0)

## 2016-01-23 LAB — T4, FREE: T4 Free: 1 ng/dL (ref 0.76–1.46)

## 2016-01-23 LAB — TSH: TSH: 6.04 uU/mL — ABNORMAL HIGH (ref 0.358–3.740)

## 2016-01-23 LAB — TROPONIN: Troponin I: <0.015 ng/mL (ref 0.000–0.045)

## 2016-01-23 LAB — ACETAMINOPHEN LEVEL: Acetaminophen Level: <2 ug/mL — ABNORMAL LOW (ref 10.0–30.0)

## 2016-01-23 LAB — ETHANOL: Ethanol Lvl: <0.003 g/dL (ref 0.000–0.003)

## 2016-01-23 NOTE — ED Provider Notes (Signed)
PATIENTDARRIEL, [name]       DOS:           01/23/2016  MR #:             [id number]             ACCOUNT #:     [id number]  DATE OF BIRTH:    1981/09/07              AGE:           35      HISTORY OF PRESENT ILLNESS:    PERTINENT HISTORY OF PRESENT  ILLNESS. This patient is here for 2  reasons.  The first is that she gets some spinning type dizziness over the past  week.  She went to a freestanding Emergency Department earlier in the week  and had  some tests done was not getting a specific diagnosis but was given  Antivert  with minimal relief.  She denies any headaches, denies any injury,  denies any  vision change, denies any hearing change, denies any falls or loss of  coordination or balance, denies any focal weakness or paresthesias,  denies any  chest pain or shortness of breath, has some chronically loose stools  secondary  to gastric bypass but no vomiting, no fevers, no flank pain, no  urinary  symptoms.  0.2 is that she is under a lot of stress from work kids  and  relationships and last Friday actually tried to kill herself by taking  a number  of pills mixed with alcohol.  She was drowsy most of Saturday but  clearly did  not kill her and she said she is no longer feeling suicidal but still  feeling  stressed and wants to establish with a counselor.  She denies any  other  attempts at self injury, has a remote history 10 years ago of some  psychiatric  problems,    PERTINENT PAST/ FAMILY/SOCIAL HISTORY Past medical history is of  stress and  depression, gastric bypass surgery, hysterectomy, cholecystectomy    She goes to [facility] family practice for primary care.  Has a job,  has 2  kids at home, has not a lot of family or other support in the area.  She denies  alcohol or drug abuse        PHYSICAL EXAM Examination shows slight tachycardia in triage but not  to  auscultation.  Heart exam is normal, lungs are clear, abdomen is soft  nontender  nondistended, flanks nontender, skin is  warm and dry, she has no  tremor, no  sign of injury, good strength and sensation all extremity is, no  nystagmus, no  conjunctival injection, mouth is moist, tympanic membranes normal,  neck is  supple, no thyromegaly, no meningismus, no adenopathy.    MEDICAL DECISION MAKING:    SIGNIFICANT FINDINGS/ED COURSE/MEDICAL DECISION MAKING/TREATMENT  PLAN  Regarding the dizziness, we will do electrocardiogram, lab work, head  CT,  orthostatics.  We will also check her thyroid.  Regarding her overdose  attempt,  we will check salicylates and acetaminophen, check liver functions,  get  psychiatry involved for an assessment here but the patient states she  does not  want to be hospitalized and is no longer suicidal, just needs to see  a  counselor.  Her fairly serious attempt however is concerning  particularly in  light of ongoing stressors, might need hospitalized.  Electrocardiogram per my dictation showed normal sinus rhythm at a  rate of 70,  normal axis interval transition, no acute ischemic change, no change  compared  to an electrocardiogram from 2 and half years ago    CT is benign, lab work is benign with the exception of an elevated  thyroid-stimulating hormone for which I sent off some follow-up  thyroid studies  which will not be available today.  Reassessment and assessment of the  mental  health worker here is that she probably should be hospitalized for  her  depression and suicide attempt.  I discussed with the patient and she  is  comfort both that and agrees to that plan.  I [facility].    I spoke with Dr. Junie SpencerJackson-Wohl, reviewed details of case, [facility]  [facility] is aware the thyroid studies are  outstanding.      PROBLEM LIST:       Admit Reason:     Dizziness: Entered Date: 23-Jan-2016 11:48, Entered By:  Standley BrookingINTERFACES,  INTERFACES, Status: Active        DIAGNOSIS Medical impression is acute depression  with suicidality,  failed  overdose attempt, possible hypothyroidism      COPIES SENT TO::     Odie SeraHARNER, CHRISTINA L(PCP): [id number]  Electronic Signatures:  Lauraine RinneSLABINSKI, Xian Alves S (MD)  (Signed 23-Jan-2016 14:33)   Authored: HISTORY OF PRESENT ILLNESS, PHYSICAL EXAM, MEDICAL DECISION  MAKING,  PROBLEM LIST, DIAGNOSIS, Copies to be sent to:      Last Updated: 23-Jan-2016 14:33 by Lauraine RinneSLABINSKI, Caley Ciaramitaro S (MD)            Please see T-Sheet, initial assessment, and physician orders for  further details.    Dictating Physician: Lauraine RinneMark S Elman Dettman, MD  Original Electronic Signature Date: 01/23/2016 12:21 P  MS  Document #: [id number]    cc:  Odie Serahristina * Harner, DO         Wonda ChengAdarsh E Krishen, MD       [address]       [address]       [place] [id number]

## 2016-01-24 LAB — T3: T3, Total: 93 ng/dL (ref 87–167)

## 2016-01-24 NOTE — H&P (Signed)
PATIENT:              [name]      ADMISSION DATE:       01/23/2016  MEDICAL RECORD #:     [id number]            ADMITTING:            Oscar La., MD  ACCOUNT #:            [id number]           ATTENDING:            Phillips Odor, DO  DATE OF BIRTH:        [birth year]             DICTATING:            Oscar La., MD  AGE:                  35                     PCP:  RACE:  B              SEX:  F                LOCATION:             M6W 2625B                                   Electronically Authenticated                          Oscar La., MD 01/27/2016 12:19 P      CHIEF COMPLAINT:  "I don'Warner want to be here."    IDENTIFYING INFORMATION AND HISTORY OF PRESENT ILLNESS:  This is a  35 year old divorced, African American female who is referred to [facility] psychiatry from [facility] ER.  She  presented there after contacting Poison Control because she was having  some residual effects from an overdose she took the previous weekend.  Most of this psychiatric evaluation is completed with collateral  information because once the patient found out that she was not going  to be discharged, she became upset and refused to answer any further  questions from this psychiatrist.    The [address] [facility] social worker had been in contact with her.  He  indicated that she has been having some relationship problems.  She  also says that she has been increasingly "stressed" with her work, her  children and other vague physical problems.  She reportedly took an  overdose over the weekend, was unsuccessful and now states that she  wishes she had been successful.  She had stated that she had plans for  her children's care.  The [address] [facility] Child psychotherapist indicated that  there was also a suicide note written.  She has a history of previous  suicide attempt 10 years ago, according to the [address] [facility] Arts development officer.    PAST PSYCHIATRIC HISTORY:  As noted above.   She was on no psychiatric  medications at the time of admission.  She is followed through the  [facility].    PAST MEDICAL HISTORY:  The patient has  had gastric bypass surgery.  She also is status post hysterectomy and cholecystectomy.  She is on  no scheduled medication at this time.    ALLERGIES:  She reports allergies to Augmentin, morphine and  nonsteroidal anti-inflammatories.    FAMILY HISTORY:  The patient was uncooperative with the exam.    SOCIAL HISTORY:  The patient is divorced.  She has 2 children.  She  apparently is employed, but we have no further detail.  It is not  clear how far that she went in school or whether there is any history  of alcohol or illicit substance use.    MENTAL STATUS EXAMINATION:  She is an alert, oriented, poorly  cooperative [place] female.  Speech is hesitant and monotone.  There is mild psychomotor retardation.  Mood is depressed.  Affect is  labile with tearfulness.  She is ambivalent, reporting her depressive  symptoms and acknowledging the recent suicide attempt and lack of  remorse.  She states that she does not believe she needs to be in the  hospital.  She related to delusions.  She did not appear to be  responding to internal stimuli.  She ambulates without difficulty.  There are no abnormal movements.  Judgment and insight are poor.  Intelligence estimated as average.  Thought processes are slow.  __________ was not obtained as the patient was poorly cooperative.    LABORATORY STUDIES:  Lab results from [facility] were  reviewed and her complete metabolic profile was significant for a  potassium of 3.4.  Otherwise, it was essentially within normal limits.  Troponins were negative.  TSH was elevated at 6.04 with a free T4 of  1.00 and a T3 of 93.  Alcohol was negative.  Acetaminophen and  salicylates were negative.  Toxicology screen was negative.  Hemogram  with differential showed a white blood cell count of 4500,  hemoglobin  of 13.0, hematocrit of 40.0, platelet count 330,000 and absolute  neutrophil count of 1800.  Urinalysis was negative.  CT scan of the  brain shows no CT evidence of acute intracranial abnormality.    VITAL SIGNS:  At the time of admission showed a blood pressure of  110/67, pulse of 78, temperature of 97.5, respirations of 16.    DIAGNOSTIC IMPRESSION:    AXIS I:  MAJOR DEPRESSIVE DISORDER, RECURRENT, SEVERE WITHOUT  PSYCHOTIC FEATURES.    AXIS II:  NO DIAGNOSIS.    AXIS III:  SEE PAST MEDICAL HISTORY.    AXIS IV:  SEE HISTORY OF PRESENT ILLNESS.    AXIS V:  CURRENT GAF 20, HIGHEST IN THE PAST YEAR UNKNOWN.    PLAN:  1.  We will offer antidepressant medication once the patient more  accepting and cooperative.  2.  We will need input from collateral sources in regards to approach  to baseline and discharge readiness.  3.  We will discharge to appropriate level of care once medically and  psychiatrically stable.          DOD:01/24/2016 10:32 A  N/smm  DOT:01/24/2016 12:16 P  Job Number: [id number]  Document Number: [id number]

## 2016-01-25 NOTE — Progress Notes (Signed)
[name], [name]   DATE  OF EVALUATION:    01/25/2016  MEDICAL RECORD #:   [id number]         ACCOUNT #:              [id number]  ATTENDING:          Victorino Dike            LOCATION:               M6W 2625A                      [name], DO  DATE OF BIRTH:      1981/05/06          AGE:                    34                            Electronically Authenticated                       Corliss Marcus, MD 01/26/2016 07:03 P    CHIEF COMPLAINT: "I don't want to open up"    SUBJECTIVE:  The patient was seen in follow up for major depressive  disorder.  The patient was seen and examined.  The chart was reviewed.  The patient was discussed with staff.  Per nursing, the patient has  been pleasant and cooperative on the unit. She has been socializing  with peers. On approach the patient is initially very guarded and does  not want to discuss the events leading up to her hospitalization.  Ultimately she begins discussing multiple stressors including her  work, children, and relationships. She states that she does not feel  smart enough to do her work. She does not feel that she is a good  enough mother and does not feel that she is good enough to have the  relationship that she would like with a very dear friend of hers.  Overall, discussed her feelings of self-worth and low self-esteem.  Discussed that this is a very long standing issue for the patient and  discussed past history that may be contributing to it. Discussed  relationships in detail and how to better set boundaries in  relationships.    Patient endorses ongoing depressed mood and anhedonia. She states that  she struggles with sleep, energy, appetite, and concentration. Denies  suicidal or homicidal ideation but still states that she does not  regret trying to kill herself. At this point feels that the only  reason it is good that she is alive is for her children. No symptoms  of mania or psychosis. We did discuss treatment options in  great  detail and patient is open at this point to medication management as  well as outpatient follow up for psychiatric treatment.    OBJECTIVE:  Vital signs: Temperature 97.9, heart rate 72, respirations  16, 100% on room air, and blood pressure 108/75.    LABORATORY DATA:  No new labs.    MENTAL STATUS EXAMINATION:  The patient appears her stated age.  She  is casually groomed, wearing hospital attire.  She is cooperative.  She makes good eye contact.  She has normal psychomotor activity.  No  involuntary movements.  Steady gait.  Ambulates independently. Her  speech is clear and spontaneous with regular rate and tone.  She was  very tearful at times. Mood is depressed.  Affect is restricted.  Thought process is organized.  No loose associations.  Thought  content:  No suicidal or homicidal ideation but continued  hopelessness.  No paranoia or delusions.   The patient does not appear  internally stimulated.  She is alert and oriented times three.  Attention and concentration are fair.  Immediate, recent and remote  memory are intact.  Abstraction is intact.  Fund of knowledge is  appropriate for level of education.  Insight and judgment are  limited.    ASSESSMENT:    AXIS I:  MAJOR DEPRESSIVE DISORDER RECURRENT SEVERE WITHOUT PSYCHOTIC  FEATURES    PLAN:  At this time, I will continue the patient's current level of  care, as the patient would benefit from continued safety monitoring,  evaluation and treatment.  I discussed treatment options in great  detail with the patient. We will start Effexor XR 37.5 mg daily for  symptoms of depression. We will also provide Vistaril as needed for  anxiety and Trazodone as needed for sleep. Discussed extensively the  need for continued outpatient follow up after discharge from the  hospital. We will continue to monitor the patient, offer support and  encourage continued interaction on the unit and in groups.  We will  discharge the patient to the appropriate level of care  when medically  and psychiatrically stable.    Total psychotherapy time 20 minutes.          Corliss Marcusherese Kayli Beal, MD    DOD: 01/25/2016 06:29 P TS/ccg  DOT: 01/25/2016 07:59 P  Document Number: [id number]  Job/Tape Number: [id number]  cc:   Corliss Marcusherese Hooria Gasparini, MD        [facility]        [address]        [place] [id number]

## 2016-01-26 NOTE — Progress Notes (Signed)
[name]:            Downum, Stina T   DATE  OF EVALUATION:    01/26/2016  MEDICAL RECORD #:   [id number]         ACCOUNT #:              [id number]  ATTENDING:          Victorino DikeJennifer            LOCATION:               M6W 2625A                      Junie SpencerJackson-Wohl, DO  DATE OF BIRTH:      20-Aug-1981          AGE:                    34                            Electronically Authenticated                       Corliss Marcusherese Briya Lookabaugh, MD 01/26/2016 07:03 P    CHIEF COMPLAINT: "Where do I start?    SUBJECTIVE:  The patient was seen in follow up for major depressive  disorder.  The patient was seen and examined.  The chart was reviewed.  The patient was discussed with staff.  Per nursing, the patient is  bright at times and tearful at others.  She was initially hesitant to  take her Effexor, but ultimately did take it.  She did shower and  attend groups, and has been active on the unit.  On approach, the  patient states that her mood is continuing to improve.  She states she  feels much less depressed than when she got her, but she is still  overwhelmed with everything she feels she needs to do after discharge  from the hospital.  The patient does state that last night she wrote a  list of pros and cons and also goals that she has for herself and  things that she would like to do.  She discussed this list in some  detail.  She also discussed the relationship with her close friend and  reviewed ways to set boundaries in this relationship and identify what  boundaries she would like to set.  We also discussed potential  barriers to setting these.  We discussed other relationships that  could similarly benefit from setting some limits and ways to improve  communication.  I again reviewed with patient self-esteem and  self-worth and ways to improve her own self-care.    The patient does acknowledge ongoing depressed mood and anhedonia.  She states that she did have some difficulty sleeping last night, and  still has low energy and  poor concentration.  She denies any thoughts  of harming herself or harming others.  No symptoms of mania or  psychosis. She has been medication compliant and is tolerating the  medications well.    OBJECTIVE:  Vital signs: Temperature 98.2, heart rate 67, respirations  16, 96% on room air, and blood pressure 106/52.    LABORATORY DATA:  No new labs or testing.    MENTAL STATUS EXAMINATION:  The patient appears her stated age.  She  is casually groomed, wearing her own personal clothing.  She is  cooperative.  She makes good eye contact.  She has normal psychomotor  activity.  No involuntary movements.  She ambulates independently with  a steady gait.  Her speech is clear and spontaneous with regular rate  and tone.  Mood is dysphoric.  Affect is mood-congruent and  restricted.  Thought process is organized.  No loose associations.  Thought content:  No suicidal or homicidal ideation.  No paranoia or  delusions.   The patient does not appear internally stimulated.  She  is alert and oriented times three.  Attention and concentration are  fair.  Immediate, recent and remote memory are intact.  Abstraction is  intact.  Fund of knowledge is appropriate for level of education.  Insight and judgment limited but improving.      ASSESSMENT:    AXIS I:  MAJOR DEPRESSIVE DISORDER, RECURRENT, SEVERE, WITHOUT  PSYCHOTIC FEATURES.    PLAN:  At this time, I will continue the patient's current level of  care, as the patient would benefit from continued safety monitoring,  evaluation and treatment.  I will continue the patient's present  medication regimen which includes Effexor XR 37.5 mg daily.  The  patient will need additional time to respond to this medication.  We  will continue to monitor the patient, offer supportive psychotherapy  and encourage continued interaction on the unit and in groups.  We  will discharge the patient to the appropriate level of care when  medically and psychiatrically stable.   Care will be resumed  by Dr.  Zadie Rhine tomorrow.    TOTAL PSYCHOTHERAPY TIME:  20 minutes.            Corliss Marcus, MD    DOD: 01/26/2016 04:41 P TS/vjw  DOT: 01/26/2016 05:35 P  Document Number: [id number]  Job/Tape Number: [id number]  cc:   Corliss Marcus, MD        [facility]        [address]        [place] [address]

## 2016-01-27 NOTE — Telephone Encounter (Signed)
Later this week (Thurs or Fri), please attempt to call pt to obtain a hospital follow up visit from [facility]. This can go on first available PGY 3 schedule - not on the [facility] follow up schedule as we did not see the patient in the hospital. Pt has not been in the office in at least 2 years.

## 2016-01-27 NOTE — Progress Notes (Signed)
PATIENTSAPHRONIA, OZDEMIR   DATE  OF EVALUATION:    01/27/2016  MEDICAL RECORD #:   [id number]         ACCOUNT #:              [id number]  ATTENDING:          Victorino Dike            LOCATION:               M6W 2625A                      [name], DO  DATE OF BIRTH:      12-Aug-1981          AGE:                    35                            Electronically Authenticated                       Lynnae January, MD 01/29/2016 12:38 P    SUBJECTIVE:  The patient is evaluated in coverage for Dr. Zadie Rhine, her  attending psychiatrist.  Nursing staff report is also reviewed.  I am  assessing her response to treatment and her progress with respect to  her depression and suicidality.  The patient reports to me that she is  beginning to open herself up to the various themes about which she  needs treatment, namely her overexerting herself to please others and  her self-neglect as a result of doing so.  She is also having  difficulty saying no and setting boundaries in appropriate ways, as  she can see better the way this leads her to feeling overwhelmed and  trapped.  She denies feeling actively suicidal at this time.  She  realizes that in the not-too-distant past, she had been in the spot of  feeling so entrapped that she considered it is now sad for her to  realize this.  She is also regretful of how resistant and guarded she  was about treatment when she first entered the hospital.  She is  tolerating the venlafaxine without any ill effects.  She is not  feeling any benefit from the trazodone and wishes for it to be  discontinued.  We discussed also her need for having an outpatient  therapist.  Prefers a one-to-one therapist and declines the invitation  to be a part of partial hospitalization or intensive outpatient  programming at this time.  Her gastrointestinal and neurological  reviews of systems are negative.    OBJECTIVE:  Mental status examination today reveals vital signs of  temperature 98.4,  pulse 77, respirations 16, blood pressure 113/63.  She is alert and oriented to person, place, correct date, cooperative  behaviorally, in behavioral control.  Activity level unremarkable.  Her speech is normal rate, volume, and tone.  Her grooming is  unimpaired.  Eye contact is consistent.  She is able to smile at times  during the interaction.  Her mood is less depressed.  Affect more  variable.  Thought processes are overall organized and logical.  No  difficulty with abstraction.  No loose associations or tangentiality.  Thought content is without any active thoughts of self-harm or  suicide.  No  active thoughts of harm to others or homicide.  No  delusions, paranoia, or hallucinations are evident.  Thought content  is more future oriented, and she is making lists and writing goals for  herself and is much more invested in making changes that are necessary  for her well being.  Her insight and judgment are improving.  Attention, concentration, recent and remote memory are intact.  Language and fund of knowledge unchanged.  No abnormal involuntary  movements.    ASSESSMENT:  major depressive disorder, recurrent, severe, without  psychotic features.  Showing signs of clinical improvement.    PLAN:  We will continue trial on venlafaxine.  She wishes for her  trazodone to be discontinued.  Will allow Vistaril to remain available  for her on a p.r.n. basis.  Continue individual crisis  intervention-oriented psychotherapy, group and milieu therapy as well.  Social Work and Transitional Care assisting with discharge planning.  We will secure an individual therapist for her as part of her  follow-up treatment.  Continue to monitor her continued safety and  improvement.  Dr. Zadie RhineKasper will resume her care as of 01/28/2016.                Lynnae Januaryavid W. Money Mckeithan, MD    DOD: 01/27/2016 01:52 P DWD/lds  DOT: 01/27/2016 02:16 P  Document Number: [id number]  Job/Tape Number: [id number]  cc:   Lynnae Januaryavid W. Chiron Campione, MD        [facility]        [address]        [place] [id number]

## 2016-01-28 NOTE — Discharge Summary (Signed)
PATIENT:             Tracey DolinDOUGLAS, [name]                  ADMIT DATE:            01/23/2016  MEDICAL RECORD NUMBER:     [id number]                        DISCHARGE DATE:        01/28/2016  ACCOUNT #:           [id number]                       DATE OF BIRTH:         [birth year]                                                          AGE:                   35                                               Electronically Authenticated  Lynnae Januaryavid W. Pinkney Venard, MD 01/29/2016 12:38 P        ATTENDING PHYSICIAN:  Phillips OdorJennifer Jackson-Wohl, DO    DICTATING PHYSICIAN:  Lynnae Januaryavid W. Skyelar Halliday, MD      TIME SPENT: 40 minutes; greater than 51% of this time was spent in  direct patient care in clinical contact, mental status evaluation,  progress updates, and planning and coordination of care.    REASON FOR ADMISSION:  As per the dictated history of the original  admitting psychiatrist, Dr. Zadie RhineKasper, the patient presented with a chief  complaint of, "I don'Warner want to be here."  This is a 35 year old  divorced, African American female who is [facility].  She presented  there after contacting Poison Control because she was having some  residual effects from an overdose she took the previous weekend.  Most  of this psychiatric evaluation is completed with collateral  information because once the patient found out that she was not going  to be discharged, she became upset and refused to answer any further  questions from this psychiatrist.     [facility] Child psychotherapistocial Worker had been in contact with her, and indicated  that she had had some relationship problems.  She had been  increasingly "stressing" with her work, her children, and other vague  physical problems.  She reportedly took an overdose over the weekend,  was unsuccessful, and now states that she wishes she had been  successful.  She had stated that she had plans for her children's  care.  The [facility] Child psychotherapistocial Worker  indicated there was also a  suicide note written.  She has a history of previous suicide attempt  10 year ago, according to the [facility] psychotherapistocial Worker.    MENTAL STATUS EXAMINATION ON ADMISSION:  As performed by Dr. Zadie RhineKasper,  the admitting psychiatrist:  She is an alert, oriented, poorly  cooperative African-American female.  Speech is hesitant and monotone.  There is mild psychomotor retardation.  Mood is depressed.  Affect is  labile with tearfulness.  She is ambivalent, reporting her depressive  symptoms and acknowledging the recent suicide attempt and lack of  remorse.  She states that she does not believe she needs to be in the  hospital.  She related no delusions.  She did not appear to be  responding to internal stimuli or hallucinations.  She ambulates  without difficulty.  There are no abnormal movements.  Judgment and  insight are poor.  Intelligence estimated as average.  Thought  processes are slow.    HOSPITAL COURSE AND TREATMENT:  During her stay, the patient received  safety monitoring conducted by the unit staff, individual crisis  intervention, oriented psychotherapy, and group and milieu therapy.  Social Work and Transitional Care assisted with discharge planning and  patient intervention.  Internal Medicine and Addiction Medicine were  not consulted, as there was no clinical indication for this.  The  patient was reluctant to take medication.  She was prescribed Effexor  and p.r.n. Vistaril and p.r.n. trazodone.  She eventually agreed to  take Effexor and benefitted from her initial doses without any ill  effects.  She also significantly benefitted from the p.r.n. Vistaril  she received.  During her stay, she did turn around in a significant  nature from being closed, to very open to having help and being very  receptive to this.  She developed goals for herself and a strong  motivation to continue with individual therapy and her medication.  She was quite grateful that she remained in the  hospital despite her  original demands to be discharged so that she could experience these  benefits.  With these significant changes, her suicidal ideation  became absent, her mood symptoms improved, and she reoriented herself  towards a goal for living and actually reengaging significantly with  her family and with her ongoing commitment to treatment.  She also  discussed and decided upon some boundary changes in her life with some  significant relationships.    MENTAL STATUS EXAM:    Her mental status exam on 01/28/2016 revealed  vital signs of temperature 98, pulse 92, respirations 18, and blood  pressure 115/76.  She is alert, oriented to person, place, and correct  date.  Casually groomed and dressed.  Her grooming has actually  improved.  Eye contact improved. Facial expression pleasant and  cheerful.  Energy level normal. Activity level within normal limits.  Behavior is cooperative and in control. Speech normal rate, volume and  tone. Mood is significantly less depressed and anxious.  Affect is  congruent with these improvements in mood state.  Thought process is  organized and logical.  No difficulty with abstraction.  Thought  content revealing no loose associations or tangentiality. No  delusions, paranoia, or hallucinations.  No active thoughts of suicide  or homicide.  No active thoughts of self-harm or harm to others.  Thought content also significant for being future oriented and  motivated for treatment.  Insight and judgment improved significantly.  Attention, concentration, recent and remote memory intact.  Language  and fund of knowledge appropriate for level of education. No abnormal  involuntary movements. With these changes, she was able to be  discharged in safe, stable, and improved condition on 01/28/2016.    DISCHARGE DIAGNOSIS:   MAJOR DEPRESSIVE DISORDER, RECURRENT.    DIET AND ACTIVITY:  Advised as  tolerated.    DISCHARGE MEDICATIONS:  Effexor and Vistaril.    FOLLOW-UP:   Follow-up  with [facility]  for individual therapy and medication monitoring.  She declined  partial hospitalization and intensive outpatient program  participation.  She is also to follow up with her primary care  physician.          Lynnae January, MD    DOD:01/28/2016 04:18 P  DWD/vjw  DOT:01/28/2016 05:16 P  Job Number: [id number]  Document Number: [id number]  cc:   Phillips Odor, DO        [address]        [address]        [place] [address]          Minette Brine, CNP        [facility]        [address]        [place] [address]

## 2016-01-29 NOTE — Telephone Encounter (Signed)
Bad number post card sent to address on file

## 2016-02-03 NOTE — Telephone Encounter (Signed)
Noted. Please alert her at appt. Thank you

## 2016-02-03 NOTE — Telephone Encounter (Signed)
I received a fax from [employer] with a request for information by attending physician. The patient is not known to me as well as I do not find an accompanying release - this may pertain to inpatient visit?  As a courtesy, please alert the patient that this was received by our office and provide her with appropriate contact information if needed for her follow up. The FAX is in your box for reference - please also scan for reference in EHR

## 2016-02-03 NOTE — Telephone Encounter (Signed)
Will alert patient on 02/25/15 @ 11:00am

## 2016-02-03 NOTE — Telephone Encounter (Signed)
noted 

## 2016-02-03 NOTE — Telephone Encounter (Signed)
Tried to call pt at [phone] (only number in system) and it is disconnected. Will continue to call.   Sending back to you as an FYI, that pt is a C6W follow up scheduled with you on 02/25/15 @ 11:00am.

## 2016-02-25 ENCOUNTER — Ambulatory Visit
Admit: 2016-02-25 | Discharge: 2016-02-25 | Payer: BLUE CROSS/BLUE SHIELD | Attending: Psychiatric/Mental Health | Primary: Student in an Organized Health Care Education/Training Program

## 2016-02-25 DIAGNOSIS — F333 Major depressive disorder, recurrent, severe with psychotic symptoms: Secondary | ICD-10-CM

## 2016-02-25 MED ORDER — SERTRALINE HCL 50 MG PO TABS
50 MG | ORAL_TABLET | Freq: Every day | ORAL | 0 refills | Status: DC
Start: 2016-02-25 — End: 2016-03-06
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
## 2016-02-25 NOTE — Patient Instructions (Signed)
IOP [phone]

## 2016-02-26 NOTE — Telephone Encounter (Signed)
Dr. Shelly Flatteneckert asked that we call the patient about the forms that were left.    Called patient [name] to call office back

## 2016-02-27 NOTE — Telephone Encounter (Signed)
Patient called back left work number [phone], tried calling no answer.  Also called cell [phone] -- spoke with patient.    [employer] forms:  1. Is this for her inpatient stay: Yes  2. Is she back to work: Yes  3. If so when did she go back: 02/03/16    Passing along to Dr. Shelly Flatteneckert, he is unavailable until Mid next week

## 2016-02-27 NOTE — Telephone Encounter (Signed)
Called patient left 2nd message to  Call office.

## 2016-03-04 NOTE — Telephone Encounter (Signed)
Dr. Shelly Flatteneckert filled out paperwork and was faxed to [employer]

## 2016-03-06 ENCOUNTER — Ambulatory Visit
Admit: 2016-03-06 | Discharge: 2016-03-06 | Payer: BLUE CROSS/BLUE SHIELD | Attending: Psychiatric/Mental Health | Primary: Student in an Organized Health Care Education/Training Program

## 2016-03-06 DIAGNOSIS — F411 Generalized anxiety disorder: Secondary | ICD-10-CM

## 2016-03-06 MED ORDER — SERTRALINE HCL 50 MG PO TABS
50 MG | ORAL_TABLET | Freq: Every day | ORAL | 0 refills | Status: DC
Start: 2016-03-06 — End: 2016-03-20

## 2016-03-06 NOTE — Progress Notes (Signed)
Time in: 0741  Time out: 0811      [name]  21-Mar-1981      Subjective:        Chief Complaint   Patient presents with   . Depression   . Anxiety   . Medication Check         Patient has been "up and down" but she since starting zoloft she has not had headaches and she denies side effects / heart palpitations.    She did have some feelings since last visit that no one cares for her. She did have a thought yesterday that was successfully able to thwart about dropping her kids off and being done with everyone.. She did end a toxic relationship which was unhealthy.    This weekend she is getting her car detailed. She has her kids this weekend. Mood - "I'm sleepy". But mood is good at present. She did something for herself yesterday which helped mood. She does continue to feel lingering depression and anxiety. She identifies "taking care of people all the time".  She went on walk since last visit. She had lack of opportunity for enjoyable activity. She denies any intention or plan to harm herself. She does not want to harm herself. She identifies not living is not really what she wants. She has a goal at her job and talks to herself. She is not depressed today but can be down / sad many more days than not. She considers her circumstances as culprit. She is considering a move to a warmer climate - she does consider that she is effected by the seasons. She notices that she still wants to stay in bed but she forces herself out of bed    Anxiety - she notices more due to talking about issues    [facility]     Sleep - normally at least 5 hours a night but it fluctuates                          ROS:   No physical complaints except for tension in head  No side effects with medication                       Past Medical History:   Diagnosis Date   . Anemia    . Chronic back pain    . Deficiency of multiple nutrient elements    . Deficiency of multiple vitamins    . Fatigue    . GERD  (gastroesophageal reflux disease)    . Intestinal malabsorption    . Irregular menstrual cycle    . Knee pain    . SOB (shortness of breath)        Past Surgical History:   Procedure Laterality Date   . BREAST LUMPECTOMY Left 2000    Benign   . CESAREAN SECTION  2005   . CHOLECYSTECTOMY  2006   . GASTRIC BYPASS SURGERY  2014    LRYGB-POZSGAY   . HYSTERECTOMY  2012    LAVH   . SALPINGECTOMY  2008    Ectopic Pregnancy         Allergies   Allergen Reactions   . Amoxicillin Swelling   . Pcn [Penicillins] Swelling            Objective:  Vitals:    03/06/16 0746   BP: 112/77   Pulse: 109   Weight: 216  lb 3.2 oz (98.1 kg)      No LMP recorded. Patient has had a hysterectomy.     Mental Status Exam:    Appearance  - appearing as healthy; wearing glasses and head garment; nicely groomed  Demeanor - pleasant, calm, cooperative  Activity - no tic / tremor; no restlessness noted  Eye Contact  - good  Speech  Usual for patient  Mood - reports per above  Affect - full  Process - goal directed in presenting thoughts  Thought Content  - future oriented  Judgement   - good  Insight  - good  Orientation  - alert and oriented x4  Attention - good  Concentration  - good  Memory  - no deficit noted  Gait - steady  Language  No deficit noted  Fund of Knowledge  - good        NO evidence of SI / HI / psychosis / mania         ASSESSMENT/PLAN:    1. Severe episode of recurrent major depressive disorder, with psychotic features (HCC)  - sertraline (ZOLOFT) 50 MG tablet; Take 1.5 tablets by mouth daily  Dispense: 45 tablet; Refill: 0    2. GAD (generalized anxiety disorder)    - pcp appt recommended for wellness exam  - increase zoloft planned for above for lingering sx (apropriate for both dx - label and off-label)  - IOP (intensive outpatient program) advised    FOLLOW-UP:  2 weeks; prn      Goals: continued improvement in depression and anxiety      Risks, benefits, and possible side effects of treatment, as well as tx change,  including  precautions, have been discussed with patient. Also discussed were alternate treatment options including no treatment / tx changes and f/u with therapy. Patient understands and agrees with plan. Patient agrees to call with any medication question or problem. Patient to call if there is a worsening change in condition or thoughts of self-harm / harm to others. Patient to call 911 in an emergency.       Current Outpatient Prescriptions:   .  sertraline (ZOLOFT) 50 MG tablet, Take 1.5 tablets by mouth daily, Disp: 45 tablet, Rfl: 0  .  meclizine (ANTIVERT) 25 MG tablet, Take 25 mg by mouth, Disp: , Rfl:   .  dicyclomine (BENTYL) 20 MG tablet, Take 20 mg by mouth, Disp: , Rfl:   .  vitamin B-12 (CYANOCOBALAMIN) 1000 MCG tablet, Place 1,000 mcg under the tongue once a week Indications: SUPPLEMENT, Disp: , Rfl:   .  zinc 50 MG CAPS, Take by mouth Indications: SUPPLEMENT, Disp: , Rfl:   .  calcium carbonate (OSCAL) 500 MG TABS tablet, Take 500 mg by mouth three times daily, Disp: , Rfl:   .  Multiple Vitamins-Minerals (MULTIVITAMIN PO), Take by mouth, Disp: , Rfl:

## 2016-03-06 NOTE — Patient Instructions (Signed)
IOP [phone]

## 2016-03-13 NOTE — Telephone Encounter (Signed)
Patient states that she spoke with Opal SidlesAnne ONeill earlier and she was sent to the ED. She has been experiencing widespread itching since last night, 2 weeks after beginning Zoloft. Per patient she was advised that it would be okay to stop the Zoloft and advised on the risks of stopping it abruptly. She would like to know if she can be prescribed an anti psychotic medication in its place. She states itching has improved after taking Vistaril. Care advice given, advised patient to call back if symptoms worsen or if new symptoms develop. She was also advised to call if she noted changes in behavior or mood. She verbalized understanding.      Reason for Disposition  ??? Taking prescription medication that could cause itching (e.g., codeine/morphine/other opiates, aspirin)    Answer Assessment - Initial Assessment Questions  1. DESCRIPTION: "Describe the itching you are having."      Itching is everywhere  2. SEVERITY: "How bad is it?"     - MILD - doesn't interfere with normal activities    - MODERATE-SEVERE: interferes with work, school, sleep, or other activities       Mild after Vistaril  3. SCRATCHING: "Are there any scratch marks? Bleeding?"      Bruises from scratching   4. ONSET: "When did this begin?"       Last night   5. CAUSE: "What do you think is causing the itching?" (ask about swimming pools, pollen, animals, soaps, etc.)      She began Zoloft 2 weeks ago   6. OTHER SYMPTOMS: "Do you have any other symptoms?"       Denies   7. PREGNANCY: "Is there any chance you are pregnant?" "When was your last menstrual period?"      No    Protocols used: ITCHING [facility]- [id number]

## 2016-03-13 NOTE — Telephone Encounter (Signed)
Per [name] pt called and reported that [facility] closer to work

## 2016-03-13 NOTE — Telephone Encounter (Signed)
Unsuccessful attempt to reach pt

## 2016-03-13 NOTE — Telephone Encounter (Signed)
Pt called and said she's been taking the Zoloft for a couple weeks, but just recently has been really itchy and now has bruises on her leg.   Pt isn't sure if this is an allergic reaction and would like a call back as soon as you can.

## 2016-03-13 NOTE — Telephone Encounter (Signed)
Attempted to reach pt

## 2016-03-13 NOTE — Telephone Encounter (Signed)
Attempted to reach patient to see if she made arrangement ot go to ED; lm

## 2016-03-13 NOTE — Telephone Encounter (Signed)
Called pt she considers that she has small black bruising to her legs - she noticed this today (dime sized she reports) . She has never increased zoloft and is currently taking 50mg  daily. Directly after taking zoloft today she noticed itching  Shortness of breath - no  Cough - no  Throat closing - no   Headache - no; dizzy - no   No confusion  No SI / HI. Denies depression    Although stopping vistaril she took 2 to thwart the itching after taking her zoloft. She will stop zoloft for now as she considers possibly having a reaction. I advised her to go to the emergency dept for physical exam now for which she agreed. She did not sound in distress and was at work. She reported that she was going to talk with supervisor. [facility]. I advised her of the risk with abrupt d/c of zoloft including relapse / worsening sx in relation to mental health. however such may cause more risk with continuance. Patient was speech was usual and not rapid nor pressured. She was speaking in full sentences without issue.

## 2016-03-13 NOTE — Telephone Encounter (Signed)
Attempted to reach pt; [name]

## 2016-03-16 NOTE — Telephone Encounter (Signed)
A message was left a few minutes ago via triage encounter of 03/13/16 for reference. Attempted again to reach pt at this time - a msg already was left

## 2016-03-16 NOTE — Telephone Encounter (Signed)
Lm for pt. Will continue documentation per encounter or 5/12

## 2016-03-16 NOTE — Telephone Encounter (Signed)
Received message; will plan to attempt f/u with patient today. Several attempts were made to reach patient on Friday (reference other encounter of that day)

## 2016-03-18 NOTE — Telephone Encounter (Signed)
I have not heard back from patient to this point. I left another message at this time.

## 2016-03-20 ENCOUNTER — Ambulatory Visit
Admit: 2016-03-20 | Discharge: 2016-03-20 | Payer: BLUE CROSS/BLUE SHIELD | Attending: Psychiatric/Mental Health | Primary: Student in an Organized Health Care Education/Training Program

## 2016-03-20 DIAGNOSIS — F333 Major depressive disorder, recurrent, severe with psychotic symptoms: Secondary | ICD-10-CM

## 2016-03-20 MED ORDER — ESCITALOPRAM OXALATE 5 MG PO TABS
5 MG | ORAL_TABLET | Freq: Every day | ORAL | 0 refills | Status: DC
Start: 2016-03-20 — End: 2016-03-27

## 2016-03-20 NOTE — Progress Notes (Signed)
Time in: 38 (patient late; she thought appt was at 0745 per her report)  Time out: 0824      [name]  September 30, 1981      Subjective:        Chief Complaint   Patient presents with   . Depression   . Anxiety   . Other         Patient interviewed for a job in [place] (same company in which she is currently employed) and thinks there is interest. She therefore has decided against IOP for now.  Patient did go to the ED (emergency dept) after last communication and she was told to continue the zoloft but also to take vistaril and benadryl for itching as per her report she was deemed to have an allergic rx to the zoloft. She did stop the zoloft regardless after that ED visit.     Mood - "still depressed". She feels lonely, sad, down. Down mood is moderate. Still has a feeling of "I'm not worthy". She is a little more positive however as she is seeing the future. No SI / HI. "I'm not as bad as I was".     Shadows "are still there". Sometimes she looks in her rear view mirror in the car and has to talk herself out of that there is someone in her back seat. In the past she has thought she has heard a basketball in the middle of the night. She feels paranoid at times. Last night she went to bed at 4a and slept for 1 hour as she was "sitting up thinking about stuff". She on the weekends stays in bed and does not do anything.  Shadows and thinking that she sees something occur 2-3 times per week. Seeing shadows have occurred since about 2010 / 2011.  Shadows only occur when depressed. Paranoia is a daily issue    It takes a lot for her to sleep. With vistaril she was able to sleep but she was waking feeling groggy. She is not taking vistaril at present.    Anxiety - "its ok". She no longer gets her fast heart beats.                         ROS:       No n/v/d  No tic / tremor  No confusion, dizziness  No current rash  No itching since d/c of zoloft  Patient reports dark spots on her legs have improved and are  resolving                       Past Medical History:   Diagnosis Date   . Anemia    . Chronic back pain    . Deficiency of multiple nutrient elements    . Deficiency of multiple vitamins    . Fatigue    . GERD (gastroesophageal reflux disease)    . Intestinal malabsorption    . Irregular menstrual cycle    . Knee pain    . SOB (shortness of breath)        Past Surgical History:   Procedure Laterality Date   . BREAST LUMPECTOMY Left 2000    Benign   . CESAREAN SECTION  2005   . CHOLECYSTECTOMY  2006   . GASTRIC BYPASS SURGERY  2014    LRYGB-POZSGAY   . HYSTERECTOMY  2012    LAVH   . SALPINGECTOMY  2008  Ectopic Pregnancy         Allergies   Allergen Reactions   . Amoxicillin Swelling   . Pcn [Penicillins] Swelling   . Zoloft [Sertraline Hcl] Itching            Objective:  Vitals:    03/20/16 0750   BP: 123/85   Pulse: 84   Temp: 99 F (37.2 C)   Weight: 208 lb 12.8 oz (94.7 kg)      No LMP recorded. Patient has had a hysterectomy.   Patient shows exposed area of rt leg (wearing a skirt)  - a few sparse spots, less than dime size, of slightly darker color than skin noted as visible  (this is resolving / improved per her report).      Mental Status Exam:    Appearance  well groomed and in no acute distress; wearing glasses; wearing head scarf / garment  Demeanor  - calm; cooperative; positive  Activity  - not restless appearing  Eye Contact  - good  Speech  - clear; as per last visit; not rapid nor pressured  Mood - reports on mood per above  Affect  - full; appropriate  Process - presents thoughts in a goal directed fashion  Thought Content   - future oriented: talks of consideration to move to [place] as well as training the 30th at her job; talks of plan to clean this weekend  Judgement   - good  Insight  - good  Orientation  - alert and oriented x4  Attention  - good; engaged in visit  Concentration   - good  Memory  No deficit noted  Gait  - steady  Language  - no deficit noted  Fund of Knowledge  - good      No  evidence of SI / HI / delusions / hallucinations / mania noted at visit           ASSESSMENT/PLAN:    1. Severe episode of recurrent major depressive disorder, with psychotic features (HCC)  - escitalopram (LEXAPRO) 5 MG tablet; Take 1 tablet by mouth daily  Dispense: 14 tablet; Refill: 0    2. GAD (generalized anxiety disorder)      Patient has already discontinued zoloft on her own since last visit due to itching. She will start lexapro as noted above at a conservative start dose - the medication is indicated for both dx. I reviewed that potential risks and benefits exist with a medication trial as in the past and this would include potential for worsening sx as well as allergic reaction. I advised patient to monitor and seek emergent care as needed. I did review that the patient had been on 2 other SSRI's with no issue but advised her nonetheless to monitor for any side effects etc. Additionally, we reviewed that she did not consider those trials effective however other factors may have been an influence - dose, circumstances, etc.  I did also recommend a trial of an antipsychotic, such as seroquel, that would have several indications (psychosis, anxiety, adjunct for depression) however the patient elected to trial another antidepressant for this time versus the antipsychotic. She did mention not sleeping well last night however just found out yesterday that a new position in [place] seemed available to her and she is strongly considering. She did not appear manic nor identify with any manic type features .     FOLLOW-UP:  1 week; prn      Goals:   Improvement  in depression and anxiety; remain free from harm    Risks, benefits, and possible side effects of treatment including precautions, have been discussed with patient as also noted per above. Also discussed were alternate treatment options including a trial of seroquel.  Patient understands and agrees with plan. Patient agrees to call with any medication question  or problem. Patient to call if there is a worsening change in condition or thoughts of self-harm / harm to others. Patient to call 911 in an emergency.       Current Outpatient Prescriptions:   .  escitalopram (LEXAPRO) 5 MG tablet, Take 1 tablet by mouth daily, Disp: 14 tablet, Rfl: 0  .  meclizine (ANTIVERT) 25 MG tablet, Take 25 mg by mouth, Disp: , Rfl:   .  dicyclomine (BENTYL) 20 MG tablet, Take 20 mg by mouth, Disp: , Rfl:   .  vitamin B-12 (CYANOCOBALAMIN) 1000 MCG tablet, Place 1,000 mcg under the tongue once a week Indications: SUPPLEMENT, Disp: , Rfl:   .  zinc 50 MG CAPS, Take by mouth Indications: SUPPLEMENT, Disp: , Rfl:   .  calcium carbonate (OSCAL) 500 MG TABS tablet, Take 500 mg by mouth three times daily, Disp: , Rfl:   .  Multiple Vitamins-Minerals (MULTIVITAMIN PO), Take by mouth, Disp: , Rfl:       Additional Narrative:  Patient this visit appeared more positive despite not having any medication for mental health. She expressed her interest in pursuing a job in [place] with her current company and appears to have been giving this opportunity some thoughtful consideration. She became tearful when considering that she does not have a strong support in which to process this consideration however also identified a co-worker and a friend with whom she considers some helpful. As again noted above, I reviewed treatment considerations and emergent planning. The patient denied any SI / HI and reported futuristically and positively

## 2016-03-20 NOTE — Telephone Encounter (Signed)
Patient attended visit this day - said she had gotten calls but due to work had not returned calls. Late entry - I called patient after visit at approximately [phone] as a courtesy as she appeared to get emotional at the end. The patient thanked me for the call and was pleasant and appearing as in distress. She was going to work. I advised her to call prn

## 2016-03-24 ENCOUNTER — Encounter

## 2016-03-27 ENCOUNTER — Ambulatory Visit
Admit: 2016-03-27 | Discharge: 2016-03-27 | Payer: BLUE CROSS/BLUE SHIELD | Attending: Psychiatric/Mental Health | Primary: Student in an Organized Health Care Education/Training Program

## 2016-03-27 DIAGNOSIS — F333 Major depressive disorder, recurrent, severe with psychotic symptoms: Secondary | ICD-10-CM

## 2016-03-27 MED ORDER — ESCITALOPRAM OXALATE 10 MG PO TABS
10 MG | ORAL_TABLET | Freq: Every day | ORAL | 0 refills | Status: DC
Start: 2016-03-27 — End: 2016-04-21

## 2016-03-27 NOTE — Progress Notes (Signed)
Time in: 0729  Time out: 0749      [name]  01/14/1981      Subjective:        Chief Complaint   Patient presents with   . Depression   . Anxiety   . Medication Check         Patient still has some days where crying but she has felt improvement with lexapro. She is not so down on herself and mood is good today. She has not seen any shadows and does not think that she has had paranoia. Patient has had some difficulty with sleep but yesterday she slept 7 hours. She still has moments when she is about to cry. She still has anxiety. No SI / HI. She has her kids this weekends. She has plans to watch movies this weekend with her children. Depression - generally mild. Anxiety - generally moderate.                         ROS:     No n/v/d  No physical complaints  No side effects with medication                     Past Medical History:   Diagnosis Date   . Anemia    . Chronic back pain    . Deficiency of multiple nutrient elements    . Deficiency of multiple vitamins    . Fatigue    . GERD (gastroesophageal reflux disease)    . Intestinal malabsorption    . Irregular menstrual cycle    . Knee pain    . SOB (shortness of breath)        Past Surgical History:   Procedure Laterality Date   . BREAST LUMPECTOMY Left 2000    Benign   . CESAREAN SECTION  2005   . CHOLECYSTECTOMY  2006   . GASTRIC BYPASS SURGERY  2014    LRYGB-POZSGAY   . HYSTERECTOMY  2012    LAVH   . SALPINGECTOMY  2008    Ectopic Pregnancy         Allergies   Allergen Reactions   . Amoxicillin Swelling   . Pcn [Penicillins] Swelling   . Zoloft [Sertraline Hcl] Itching            Objective:  Vitals:    03/27/16 0732   BP: 112/78   Pulse: 97   Weight: 208 lb 6.4 oz (94.5 kg)      No LMP recorded. Patient has had a hysterectomy.     Mental Status Exam:    Appearance   - appearing as healthy; smiling  Demeanor  - positive; calm  Activity  - not restless; no tic / tremor noted  Eye Contact  - good  Speech  - clear; not slowed / slurred  Mood  - reports  on mood per above  Affect  - fill; mood reported congruent with presentation  Process  - presents thoughts in a goal directed fashion  Thought Content   - future oriented; talks of weekend plans and plan for training at work  Judgement  - good  Insight  - good  Orientation  - alert and oriented x4  Attention  - good  Concentration   - no issue noted  Memory  - no deficit noted  Gait  - steady  Language  - no deficit noted  Fund of Knowledge  - good  No evidence of SI / HI / psychosis / mania / delusions                 ASSESSMENT/PLAN:    1. Severe episode of recurrent major depressive disorder, with psychotic features (HCC)  - escitalopram (LEXAPRO) 10 MG tablet; Take 1 tablet by mouth daily  Dispense: 30 tablet; Refill: 0    2. GAD (generalized anxiety disorder)    Patient will increase lexapro to  for lingering sx and as she is tolerating the medication without issue    FOLLOW-UP:  3 weeks; prn; I advised pt to call for sooner visit prn      Goals:     Continued improvement in anxiety and depression; patient to remain free from harm    Risks, benefits, and possible side effects of treatment (as well as that in relation to this days plan to increase lexapro), including precautions, have been discussed with patient as a review this visit. Patient understands and agrees with plan. Patient agrees to call with any medication question or problem. Patient to call if there is a worsening change in condition or thoughts of self-harm / harm to others. Patient to call 911 in an emergency.       Current Outpatient Prescriptions:   .  escitalopram (LEXAPRO) 10 MG tablet, Take 1 tablet by mouth daily, Disp: 30 tablet, Rfl: 0  .  meclizine (ANTIVERT) 25 MG tablet, Take 25 mg by mouth, Disp: , Rfl:   .  dicyclomine (BENTYL) 20 MG tablet, Take 20 mg by mouth, Disp: , Rfl:   .  vitamin B-12 (CYANOCOBALAMIN) 1000 MCG tablet, Place 1,000 mcg under the tongue once a week Indications: SUPPLEMENT, Disp: , Rfl:   .  zinc 50 MG  CAPS, Take by mouth Indications: SUPPLEMENT, Disp: , Rfl:   .  calcium carbonate (OSCAL) 500 MG TABS tablet, Take 500 mg by mouth three times daily, Disp: , Rfl:   .  Multiple Vitamins-Minerals (MULTIVITAMIN PO), Take by mouth, Disp: , Rfl:       Additional Narrative:  Patient's report of improvement was consistent with presentation. The patient reported vaguely and generally on sx relating to depression and anxiety however rated the lingering sx pertaining to both per above. Additionally - usual dose range of 10 mg to 20 mg is noted. Her report were focal to future plans, awaiting answer re: work transfer, and this weekend's upcoming plans.

## 2016-04-21 ENCOUNTER — Encounter

## 2016-04-21 MED ORDER — ESCITALOPRAM OXALATE 10 MG PO TABS
10 MG | ORAL_TABLET | Freq: Every day | ORAL | 0 refills | Status: DC
Start: 2016-04-21 — End: 2020-05-10

## 2016-04-21 NOTE — Telephone Encounter (Signed)
LM letting patient know rx was sent to the pharm, please call to schedule follow up and if any concerns please call office to speak w Thurston HoleAnne.

## 2016-04-21 NOTE — Telephone Encounter (Signed)
Please call pt and let her know that rx was sent for lexapro. Please request that she schedule a f/u appt and advise me of any concerns

## 2016-04-24 ENCOUNTER — Encounter: Attending: Psychiatric/Mental Health | Primary: Student in an Organized Health Care Education/Training Program

## 2016-05-01 ENCOUNTER — Encounter

## 2016-05-01 NOTE — Telephone Encounter (Signed)
Called [facility] -spoke w Helmut MusterAlicia (pharmacist) -please disregard request, they have rx on file from 6/20

## 2016-05-01 NOTE — Telephone Encounter (Signed)
This was just sent 10days ago on 6/20- please verify it was received

## 2016-05-18 ENCOUNTER — Encounter: Attending: Psychiatric/Mental Health | Primary: Student in an Organized Health Care Education/Training Program

## 2016-05-18 NOTE — Telephone Encounter (Signed)
Patient no showed today

## 2016-05-18 NOTE — Telephone Encounter (Signed)
Letter 1 please

## 2016-05-18 NOTE — Telephone Encounter (Signed)
signed

## 2016-05-18 NOTE — Telephone Encounter (Signed)
Letter 1 typed and ready for signature.

## 2016-05-18 NOTE — Telephone Encounter (Signed)
Letter 1 signed and mailed.

## 2016-12-25 ENCOUNTER — Encounter (HOSPITAL_BASED_OUTPATIENT_CLINIC_OR_DEPARTMENT_OTHER): Payer: Self-pay | Admitting: *Deleted

## 2016-12-25 ENCOUNTER — Emergency Department (HOSPITAL_BASED_OUTPATIENT_CLINIC_OR_DEPARTMENT_OTHER)
Admission: EM | Admit: 2016-12-25 | Discharge: 2016-12-25 | Disposition: A | Discharge status: Home / Self Care | Payer: Managed Care, Other (non HMO) | Attending: Emergency Medicine | Admitting: Emergency Medicine

## 2016-12-25 ENCOUNTER — Emergency Department (HOSPITAL_BASED_OUTPATIENT_CLINIC_OR_DEPARTMENT_OTHER): Payer: Managed Care, Other (non HMO)

## 2016-12-25 DIAGNOSIS — R1013 Epigastric pain: Secondary | ICD-10-CM | POA: Diagnosis not present

## 2016-12-25 DIAGNOSIS — R110 Nausea: Secondary | ICD-10-CM | POA: Insufficient documentation

## 2016-12-25 LAB — CBC WITH DIFFERENTIAL/PLATELET
Basophils Absolute: 0 10*3/uL (ref 0.0–0.1)
Basophils Relative: 1 %
EOS PCT: 4 %
Eosinophils Absolute: 0.2 10*3/uL (ref 0.0–0.7)
HCT: 36 % (ref 36.0–46.0)
HEMOGLOBIN: 11.7 g/dL — AB (ref 12.0–15.0)
LYMPHS ABS: 2.1 10*3/uL (ref 0.7–4.0)
LYMPHS PCT: 46 %
MCH: 28.3 pg (ref 26.0–34.0)
MCHC: 32.5 g/dL (ref 30.0–36.0)
MCV: 87.2 fL (ref 78.0–100.0)
Monocytes Absolute: 0.3 10*3/uL (ref 0.1–1.0)
Monocytes Relative: 7 %
Neutro Abs: 1.8 10*3/uL (ref 1.7–7.7)
Neutrophils Relative %: 42 %
PLATELETS: 361 10*3/uL (ref 150–400)
RBC: 4.13 MIL/uL (ref 3.87–5.11)
RDW: 12.6 % (ref 11.5–15.5)
WBC: 4.4 10*3/uL (ref 4.0–10.5)

## 2016-12-25 LAB — PREGNANCY, URINE: Preg Test, Ur: NEGATIVE

## 2016-12-25 LAB — URINALYSIS, ROUTINE W REFLEX MICROSCOPIC
BILIRUBIN URINE: NEGATIVE
Glucose, UA: NEGATIVE mg/dL
HGB URINE DIPSTICK: NEGATIVE
Ketones, ur: NEGATIVE mg/dL
Leukocytes, UA: NEGATIVE
Nitrite: NEGATIVE
PROTEIN: NEGATIVE mg/dL
Specific Gravity, Urine: 1.022 (ref 1.005–1.030)
pH: 6 (ref 5.0–8.0)

## 2016-12-25 LAB — COMPREHENSIVE METABOLIC PANEL
ALK PHOS: 58 U/L (ref 38–126)
ALT: 12 U/L — AB (ref 14–54)
AST: 18 U/L (ref 15–41)
Albumin: 4.1 g/dL (ref 3.5–5.0)
Anion gap: 7 (ref 5–15)
BUN: 12 mg/dL (ref 6–20)
CALCIUM: 8.7 mg/dL — AB (ref 8.9–10.3)
CO2: 26 mmol/L (ref 22–32)
CREATININE: 0.99 mg/dL (ref 0.44–1.00)
Chloride: 105 mmol/L (ref 101–111)
Glucose, Bld: 90 mg/dL (ref 65–99)
Potassium: 3.5 mmol/L (ref 3.5–5.1)
Sodium: 138 mmol/L (ref 135–145)
TOTAL PROTEIN: 7.5 g/dL (ref 6.5–8.1)
Total Bilirubin: 0.4 mg/dL (ref 0.3–1.2)

## 2016-12-25 LAB — LIPASE, BLOOD: LIPASE: 24 U/L (ref 11–51)

## 2016-12-25 MED ORDER — GI COCKTAIL ~~LOC~~
30.0000 mL | Freq: Once | ORAL | Status: AC
  Administered 2016-12-25: 30 mL via ORAL
  Filled 2016-12-25: qty 30

## 2016-12-25 MED ORDER — ONDANSETRON HCL 4 MG/2ML IJ SOLN
4.0000 mg | Freq: Once | INTRAMUSCULAR | Status: AC
  Administered 2016-12-25: 4 mg via INTRAVENOUS
  Filled 2016-12-25: qty 2

## 2016-12-25 MED ORDER — MORPHINE SULFATE (PF) 4 MG/ML IV SOLN
4.0000 mg | Freq: Once | INTRAVENOUS | Status: AC
  Administered 2016-12-25: 4 mg via INTRAVENOUS
  Filled 2016-12-25: qty 1

## 2016-12-25 MED ORDER — IOPAMIDOL (ISOVUE-300) INJECTION 61%
100.0000 mL | Freq: Once | INTRAVENOUS | Status: AC | PRN
  Administered 2016-12-25: 100 mL via INTRAVENOUS

## 2016-12-25 MED ORDER — OMEPRAZOLE 20 MG PO CPDR: 20.0000 mg | DELAYED_RELEASE_CAPSULE | Freq: Every day | ORAL | 0 refills | Status: AC

## 2016-12-25 MED FILL — OMEPRAZOLE DR 20 MG CAPSULE: 20 | 30 days supply | Qty: 30 | Fill #0

## 2016-12-25 NOTE — ED Triage Notes (Signed)
Abdominal pain this am. Hx of pancreatitis and she feels this is the same.

## 2016-12-25 NOTE — ED Notes (Signed)
ED Provider at bedside. 

## 2016-12-25 NOTE — Discharge Instructions (Signed)
Your CT overall does not show cause of your symptoms. This may be gastritis or peptic ulcer disease.  You have been given follow-up above for gastroenterology follow-up as needed.  Return for worsening symptoms, including fever, intractable vomiting, bloody or black stools, escalating pain, or any other symptoms concerning to you.

## 2016-12-25 NOTE — ED Notes (Signed)
Patient transported to CT 

## 2016-12-25 NOTE — ED Provider Notes (Signed)
[facility] Provider [facility]   CSN: [id number] Arrival date & time: 12/25/16  1133     History   Chief Complaint Chief Complaint  Patient presents with  . Abdominal Pain    HPI Julie Lam is a 36 y.o. female.  HPI 36 year old female who presents with abdominal pain. She has a history of cholecystectomy, gastric bypass, cesarean section, abdominal hysterectomy. States onset of epigastric abdominal pain that is sharp and twisting in nature starting this morning. 7/10 in severity currently. Did not take medications for pain prior to arrival. Similar to when she has had pancreatitis in the past. Pain radiates to the back. Associated with decreased appetite and only drinks some coffee today. Pain not worsened by drinking/eating. Has had nausea but no vomiting. Normal bowel movement earlier today, and no melena or hematochezia or diarrhea. No dysuria or urinary frequency. No recent NSAID usage. No recent alcohol intake. History reviewed. No pertinent past medical history.  There are no active problems to display for this patient.   Past Surgical History:  Procedure Laterality Date  . ABDOMINAL HYSTERECTOMY    . BREAST SURGERY    . CESAREAN SECTION    . CHOLECYSTECTOMY    . ECTOPIC PREGNANCY SURGERY    . GASTRIC BYPASS      OB History    No data available       Home Medications    Prior to Admission medications   Medication Sig Start Date End Date Taking? Authorizing Provider  omeprazole (PRILOSEC) 20 MG capsule Take 1 capsule (20 mg total) by mouth daily. 12/25/16   Lavera Guise, MD    Family History No family history on file.  Social History Social History  Substance Use Topics  . Smoking status: Never Smoker  . Smokeless tobacco: Never Used  . Alcohol use No     Allergies   Amoxicillin   Review of Systems Review of Systems 10/14 systems reviewed and are negative other than those stated in the HPI   Physical Exam Updated Vital Signs BP  119/84 (BP Location: Left Arm)   Pulse 62   Temp 97.5 F (36.4 C) (Oral)   Resp 17   Ht 5\' 4"  (1.626 m)   Wt 207 lb (93.9 kg)   SpO2 100%   BMI 35.53 kg/m   Physical Exam Physical Exam  Nursing note and vitals reviewed. Constitutional: Non-toxic, and in no acute distress Head: Normocephalic and atraumatic.  Mouth/Throat: Oropharynx is clear and moist.  Neck: Normal range of motion. Neck supple.  Cardiovascular: Normal rate and regular rhythm.   Pulmonary/Chest: Effort normal and breath sounds normal.  Abdominal: Soft. There is epigastric tenderness. There is no rebound and no guarding.  Musculoskeletal: Normal range of motion.  Neurological: Alert, no facial droop, fluent speech, moves all extremities symmetrically Skin: Skin is warm and dry.  Psychiatric: Cooperative   ED Treatments / Results  Labs (all labs ordered are listed, but only abnormal results are displayed) Labs Reviewed  CBC WITH DIFFERENTIAL/PLATELET - Abnormal; Notable for the following:       Result Value   Hemoglobin 11.7 (*)    All other components within normal limits  COMPREHENSIVE METABOLIC PANEL - Abnormal; Notable for the following:    Calcium 8.7 (*)    ALT 12 (*)    All other components within normal limits  PREGNANCY, URINE  LIPASE, BLOOD  URINALYSIS, ROUTINE W REFLEX MICROSCOPIC    EKG  EKG Interpretation None  Radiology Ct Abdomen Pelvis W Contrast  Result Date: 12/25/2016 CLINICAL DATA:  Epigastric pain this morning radiating to the back. History of pancreatitis. EXAM: CT ABDOMEN AND PELVIS WITH CONTRAST TECHNIQUE: Multidetector CT imaging of the abdomen and pelvis was performed using the standard protocol following bolus administration of intravenous contrast. CONTRAST:  100 ml ISOVUE-300 IOPAMIDOL (ISOVUE-300) INJECTION 61% COMPARISON:  None. FINDINGS: Lower chest: The lung bases are clear. No pleural or pericardial effusion. Hepatobiliary: The patient is status post  cholecystectomy. Mild intrahepatic biliary ductal dilatation is identified and the common bile duct measures 1.2 cm. No stone is seen within the duct. No focal liver lesion. Pancreas: Unremarkable. No pancreatic ductal dilatation or surrounding inflammatory changes. Spleen: Normal in size without focal abnormality. Adrenals/Urinary Tract: Adrenal glands are unremarkable. Kidneys are normal, without renal calculi, focal lesion, or hydronephrosis. Bladder is unremarkable. Stomach/Bowel: The patient is status post gastric bypass without evidence of complication. The colon and appendix appear normal. Vascular/Lymphatic: No significant vascular findings are present. No enlarged abdominal or pelvic lymph nodes. Reproductive: Status post hysterectomy. No adnexal masses. Other: No fluid collection or hernia. Musculoskeletal: Negative. IMPRESSION: Mild intra and extrahepatic biliary ductal dilatation could be related to the prior cholecystectomy but is greater than typically seen in a 36 year old. If there is concern for biliary stricture, MRCP could be used for further evaluation. Negative for pancreatitis. Status post cholecystectomy, gastric bypass and hysterectomy. Electronically Signed   By: Drusilla Kannerhomas  Dalessio M.D.   On: 12/25/2016 14:38    Procedures Procedures (including critical care time)  Medications Ordered in ED Medications  ondansetron (ZOFRAN) injection 4 mg (4 mg Intravenous Given 12/25/16 1248)  morphine 4 MG/ML injection 4 mg (4 mg Intravenous Given 12/25/16 1248)  iopamidol (ISOVUE-300) 61 % injection 100 mL (100 mLs Intravenous Contrast Given 12/25/16 1419)  gi cocktail (Maalox,Lidocaine,Donnatal) (30 mLs Oral Given 12/25/16 1450)     Initial Impression / Assessment and Plan / ED Course  I have reviewed the triage vital signs and the nursing notes.  Pertinent labs & imaging results that were available during my care of the patient were reviewed by me and considered in my medical decision making  (see chart for details).     36 year old female who presents with epigastric abdominal pain. She is nontoxic and in no acute distress with normal vital signs. Abdomen is soft and nonsurgical, but with pain primarily in the epigastric region of her abdomen. CBC, comp and metabolic panel, lipase are unremarkable. UA without signs of infection or blood. Given her history of gastric I pass, CT was performed, July, does not show any acute intra-abdominal processes. Slightly enlarged biliary ducts, which is likely due to her prior cholecystectomy, and there is no evidence of obstruction by stricture based on on her comprehensive metabolic panel. Suspect potential gastritis versus peptic ulcer disease. Started on PPI. Given GI referral. Strict return and follow-up instructions reviewed. She expressed understanding of all discharge instructions and felt comfortable with the plan of care.   Final Clinical Impressions(s) / ED Diagnoses   Final diagnoses:  Epigastric abdominal pain    New Prescriptions New Prescriptions   OMEPRAZOLE (PRILOSEC) 20 MG CAPSULE    Take 1 capsule (20 mg total) by mouth daily.     Lavera Guiseana Duo Lyne Khurana, MD 12/25/16 [phone]

## 2017-04-26 NOTE — Telephone Encounter (Signed)
Unable to reach patient. Not able to leave a message. Will try again. Due to time since last seen and severity of past sx, it would appear that patient needs evaluation / to establish care. Will suggest ED prn

## 2017-04-26 NOTE — Telephone Encounter (Signed)
Pt moved to [place] and is looking for a dr. Would like to know if you can send her in some Lexapro. Stated she is starting to feel like she did before. I explained to pt that I wasn't sure if you would send her in anything since she hasn't been seen in awhile.

## 2017-04-27 NOTE — Telephone Encounter (Signed)
Unable to reach pt

## 2017-04-30 NOTE — Telephone Encounter (Signed)
Unable to reach pt #3. Will close encounter and await another call

## 2017-07-09 IMAGING — CT CT ABD-PELV W/ CM
2 of 4 series · 17 of 46 positions shown, 19 images · IV contrast (APPLIED)
Comparison: None.

CLINICAL DATA: Epigastric pain this morning radiating to the back.
History of pancreatitis.

EXAM:
CT ABDOMEN AND PELVIS WITH CONTRAST
TECHNIQUE: Multidetector CT imaging of the abdomen and pelvis was performed
using the standard protocol following bolus administration of
intravenous contrast.
CONTRAST:  100 ml 6LZTXB-866 IOPAMIDOL (6LZTXB-866) INJECTION 61%

[Series 2: axial st · axial · 0.83mm/px · z∈[-413,+42]mm · 14 of 101 slices shown, 16 images]
[im 5/101  soft-tissue]
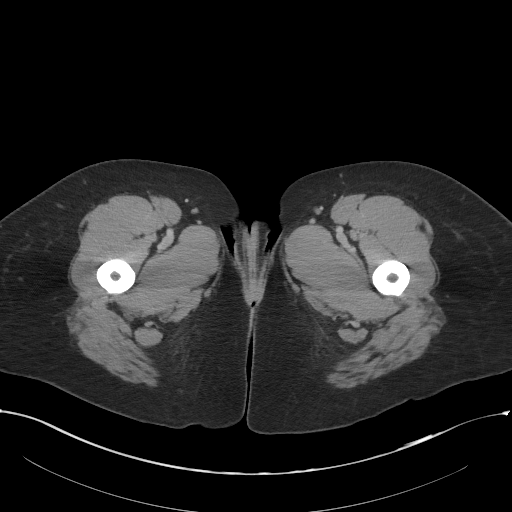
[im 5/101  bone]
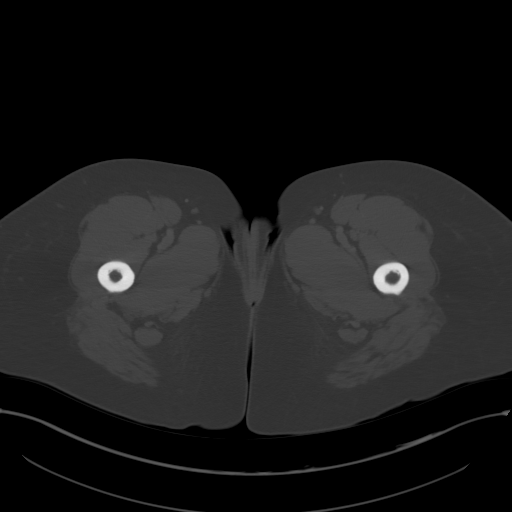
[im 14/101  soft-tissue]
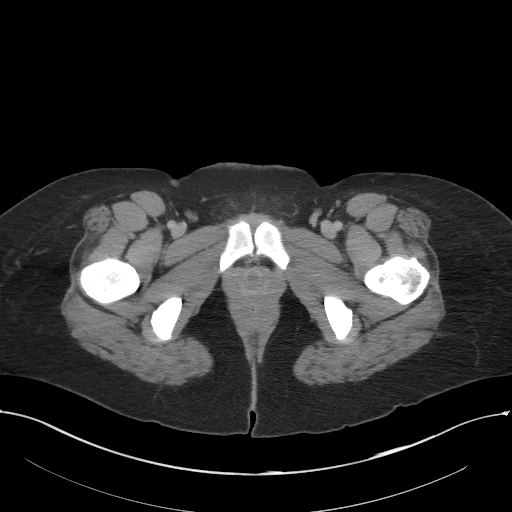
[im 18/101  soft-tissue]
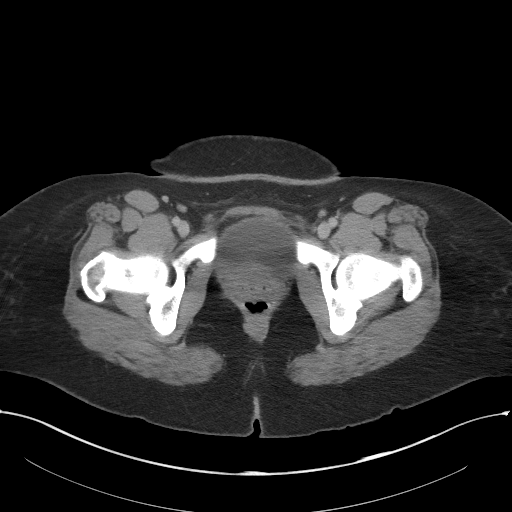
[im 27/101  soft-tissue]
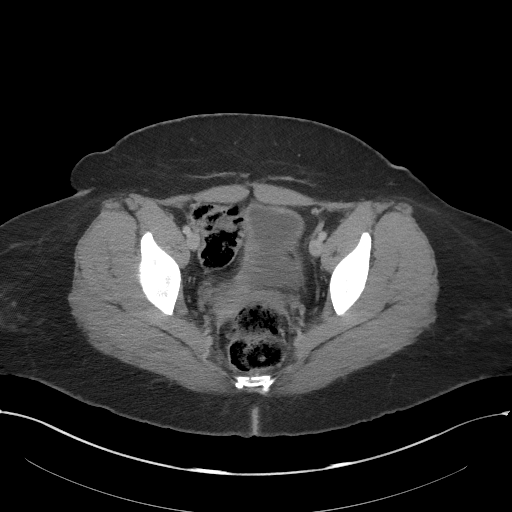
[im 35/101  soft-tissue]
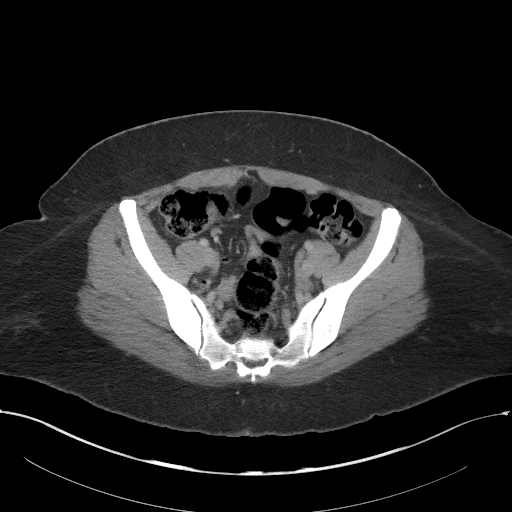
[im 40/101  soft-tissue]
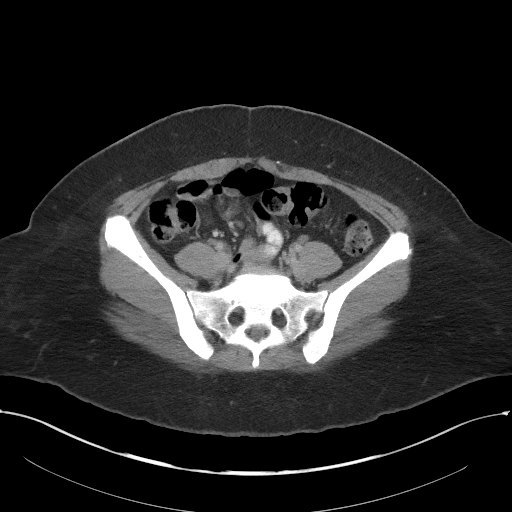
[im 48/101  soft-tissue]
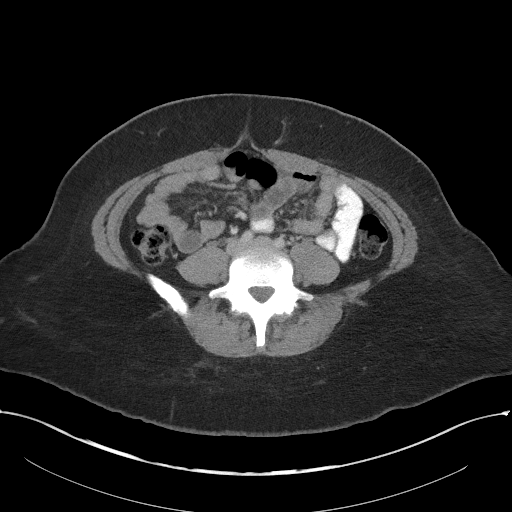
[im 53/101  soft-tissue]
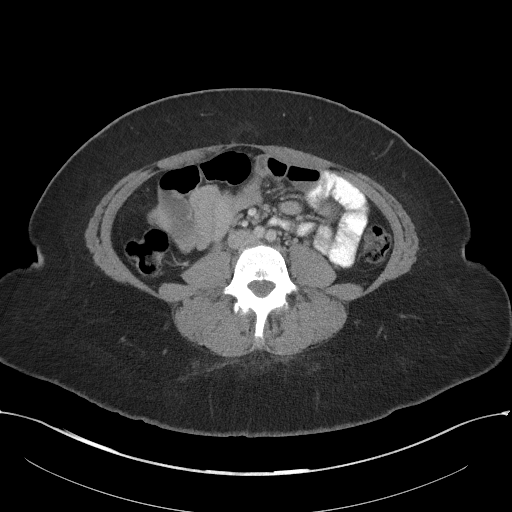
[im 61/101  soft-tissue]
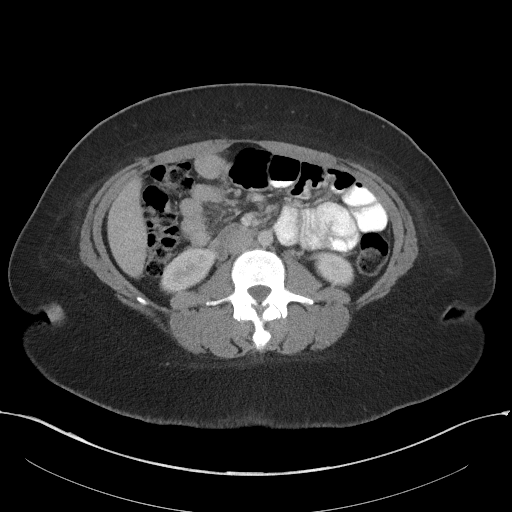
[im 61/101  bone]
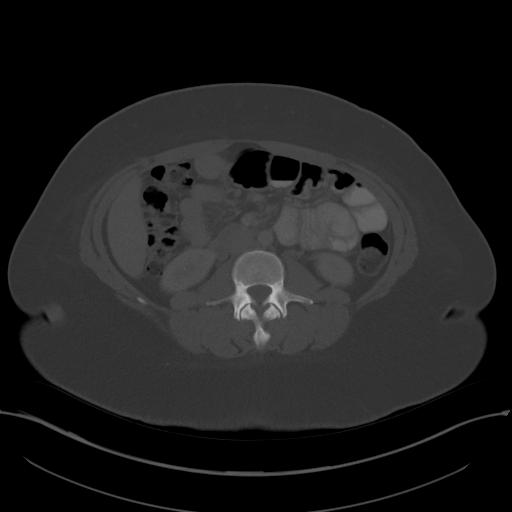
[im 66/101  soft-tissue]
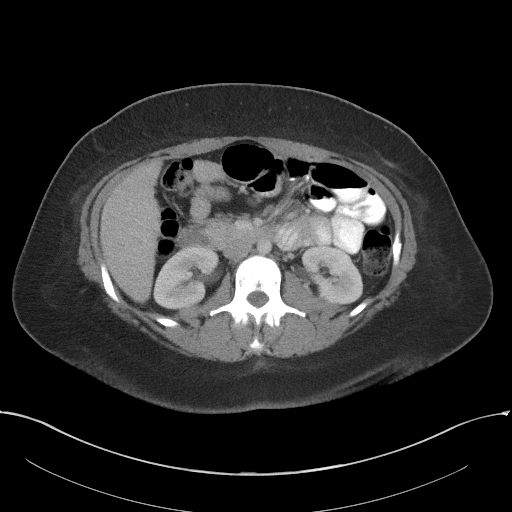
[im 74/101  soft-tissue]
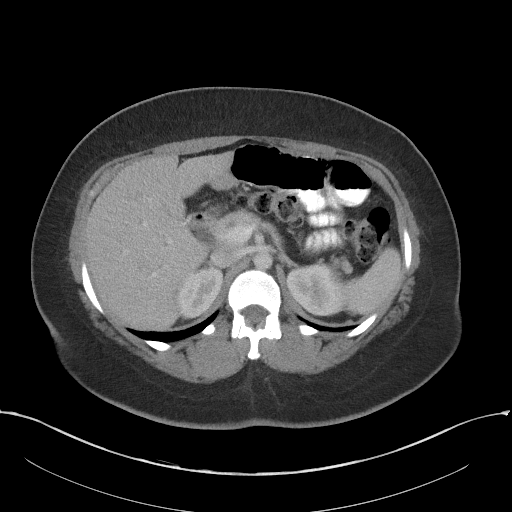
[im 83/101  soft-tissue]
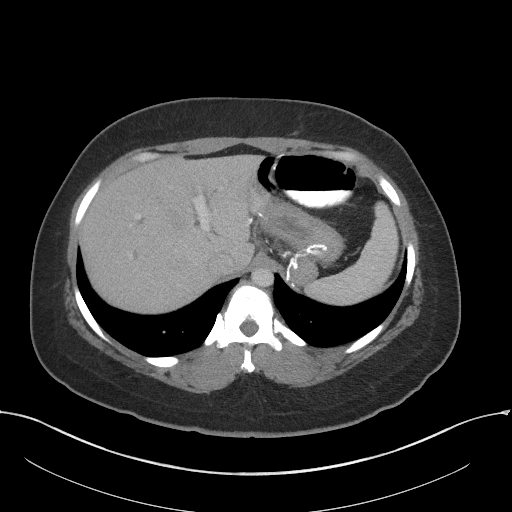
[im 87/101  soft-tissue]
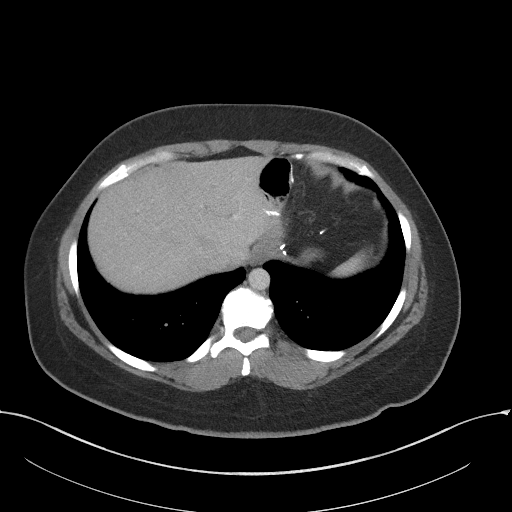
[im 96/101  soft-tissue]
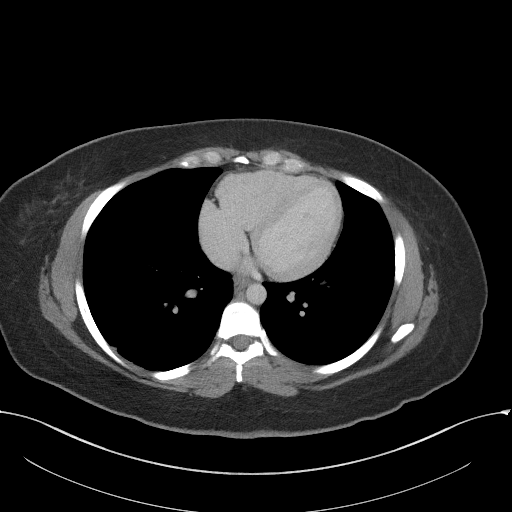

[Series 5: coronal st · coronal · 1.03mm/px · 3 of 83 slices shown]
[im 28/83  soft-tissue]
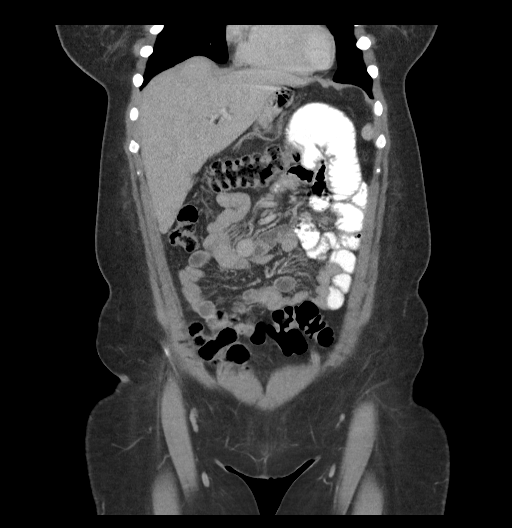
[im 37/83  soft-tissue]
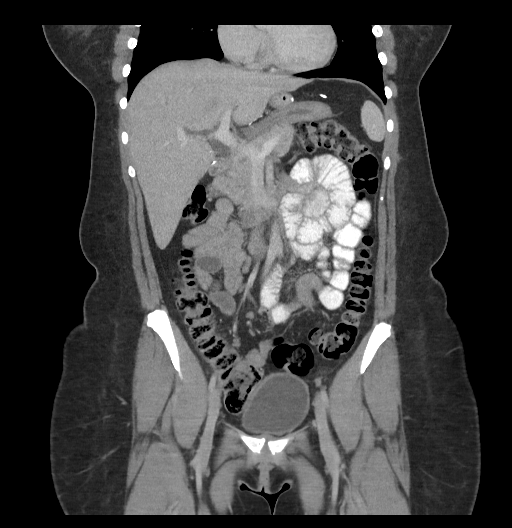
[im 46/83  soft-tissue]
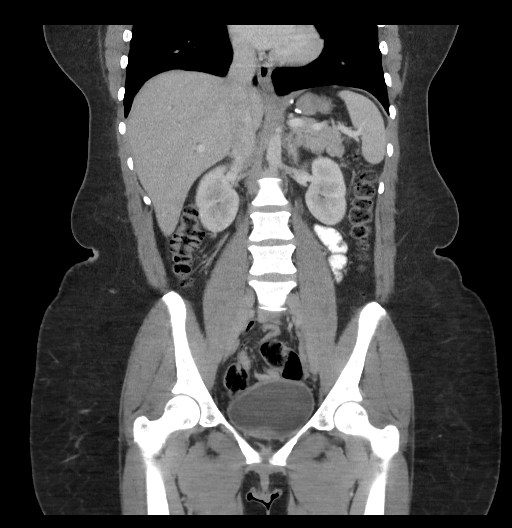

[17 of 46 positions shown; findings below may reference images not displayed]

FINDINGS: Lower chest: The lung bases are clear. No pleural or pericardial
effusion.

Hepatobiliary: The patient is status post cholecystectomy. Mild
intrahepatic biliary ductal dilatation is identified and the common
bile duct measures 1.2 cm. No stone is seen within the duct. No
focal liver lesion.

Pancreas: Unremarkable. No pancreatic ductal dilatation or
surrounding inflammatory changes.

Spleen: Normal in size without focal abnormality.

Adrenals/Urinary Tract: Adrenal glands are unremarkable. Kidneys are
normal, without renal calculi, focal lesion, or hydronephrosis.
Bladder is unremarkable.

Stomach/Bowel: The patient is status post gastric bypass without
evidence of complication. The colon and appendix appear normal.

Vascular/Lymphatic: No significant vascular findings are present. No
enlarged abdominal or pelvic lymph nodes.

Reproductive: Status post hysterectomy. No adnexal masses.

Other: No fluid collection or hernia.

Musculoskeletal: Negative.
IMPRESSION: Mild intra and extrahepatic biliary ductal dilatation could be
related to the prior cholecystectomy but is greater than typically
seen in a 35-year-old. If there is concern for biliary stricture,
MRCP could be used for further evaluation.

Negative for pancreatitis.

Status post cholecystectomy, gastric bypass and hysterectomy.

## 2017-10-07 ENCOUNTER — Encounter (HOSPITAL_BASED_OUTPATIENT_CLINIC_OR_DEPARTMENT_OTHER): Payer: Self-pay | Admitting: Emergency Medicine

## 2017-10-07 ENCOUNTER — Emergency Department (HOSPITAL_BASED_OUTPATIENT_CLINIC_OR_DEPARTMENT_OTHER)
Admission: EM | Admit: 2017-10-07 | Discharge: 2017-10-08 | Disposition: A | Discharge status: Home / Self Care | Payer: Worker's Compensation | Attending: Emergency Medicine | Admitting: Emergency Medicine

## 2017-10-07 ENCOUNTER — Other Ambulatory Visit: Payer: Self-pay

## 2017-10-07 DIAGNOSIS — M79605 Pain in left leg: Secondary | ICD-10-CM

## 2017-10-07 DIAGNOSIS — Z79899 Other long term (current) drug therapy: Secondary | ICD-10-CM | POA: Insufficient documentation

## 2017-10-07 NOTE — ED Triage Notes (Signed)
Pt reports while standing at work she felt a "pop" in her left calf with onset of pain.

## 2017-10-07 NOTE — ED Notes (Signed)
ED Provider at bedside. 

## 2017-10-08 ENCOUNTER — Ambulatory Visit (HOSPITAL_BASED_OUTPATIENT_CLINIC_OR_DEPARTMENT_OTHER)
Admission: RE | Admit: 2017-10-08 | Discharge: 2017-10-08 | Disposition: A | Discharge status: Home / Self Care | Payer: Self-pay | Source: Ambulatory Visit | Attending: Student | Admitting: Student

## 2017-10-08 DIAGNOSIS — M79662 Pain in left lower leg: Secondary | ICD-10-CM | POA: Insufficient documentation

## 2017-10-08 DIAGNOSIS — Z86718 Personal history of other venous thrombosis and embolism: Secondary | ICD-10-CM | POA: Insufficient documentation

## 2017-10-08 MED ORDER — IBUPROFEN 800 MG PO TABS
800.0000 mg | ORAL_TABLET | Freq: Once | ORAL | Status: AC
  Administered 2017-10-08: 800 mg via ORAL
  Filled 2017-10-08: qty 1

## 2017-10-08 MED ORDER — DICLOFENAC SODIUM 1 % TD GEL: 2.0000 g | Freq: Four times a day (QID) | TRANSDERMAL | 0 refills | Status: AC

## 2017-10-08 MED ORDER — IBUPROFEN 800 MG PO TABS
800.0000 mg | ORAL_TABLET | Freq: Three times a day (TID) | ORAL | 0 refills | Status: DC
Start: 2017-10-08 — End: 2017-10-08

## 2017-10-08 MED ORDER — CYCLOBENZAPRINE HCL 5 MG PO TABS: 5.0000 mg | ORAL_TABLET | Freq: Every evening | ORAL | 0 refills | Status: DC | PRN

## 2017-10-08 MED ORDER — CYCLOBENZAPRINE HCL 5 MG PO TABS: 5.0000 mg | ORAL_TABLET | Freq: Every evening | ORAL | 0 refills | Status: AC | PRN

## 2017-10-08 NOTE — Discharge Instructions (Addendum)
Take ibuprofen 3 times a day with meals.  Do not take other anti-inflammatories at the same time open (Advil, Motrin, naproxen, Aleve). You may supplement with Tylenol if you need further pain control. Use ice packs or heating pads to help control your pain. Take flexeril at bedtime for muscle stiffness and soreness. Have caution, as this may make you tired/groggy. Do not drive while taking this medication.  Return tomorrow for a DVT study.  Follow up with the orthopedic doctor for further management of your pain.  Return to the ER if you develop fevers, redness, numbness, or any new or worsening symptoms.

## 2017-10-08 NOTE — ED Provider Notes (Signed)
[facility] EMERGENCY DEPARTMENT Provider Note   CSN: [id number] Arrival date & time: 10/07/17  2329     History   Chief Complaint Chief Complaint  Patient presents with  . Leg Pain    HPI Julie Lam is a 36 y.o. female presenting with left leg pain.  Patient states she was at work when she leaned forward, felt a pop in her left leg, and had acute onset left calf pain.  It is a sharp crampy feeling.  It feels like a charley horse that will not go away.  It is worse with walking and flexion of her ankle.  Nothing has made it better, she tried a pain-ease pill at work without improvement.  She denies pain in her knee or ankle.  She denies fall, trauma, or injury.  She denies pain radiating elsewhere.  She has never injured this leg before.  She does not have an orthopedic doctor.  This occurred around 1015 tonight.  She denies numbness or tingling.  She denies recent surgery, immobilization, or travel.  She is not on hormones.  She denies history of DVT or cancer.  She states she has a family history of DVTs.  She has no medical problems, does not take medications daily.  HPI  History reviewed. No pertinent past medical history.  There are no active problems to display for this patient.   Past Surgical History:  Procedure Laterality Date  . ABDOMINAL HYSTERECTOMY    . BREAST SURGERY    . CESAREAN SECTION    . CHOLECYSTECTOMY    . ECTOPIC PREGNANCY SURGERY    . GASTRIC BYPASS      OB History    No data available       Home Medications    Prior to Admission medications   Medication Sig Start Date End Date Taking? Authorizing Provider  cyclobenzaprine (FLEXERIL) 5 MG tablet Take 1 tablet (5 mg total) by mouth at bedtime as needed for muscle spasms. 10/08/17   Travis Purk, PA-C  diclofenac sodium (VOLTAREN) 1 % GEL Apply 2 g topically 4 (four) times daily. 10/08/17   Dalyah Pla, PA-C  omeprazole (PRILOSEC) 20 MG capsule Take 1 capsule (20 mg total)  by mouth daily. 12/25/16   Lavera GuiseLiu, Dana Duo, MD    Family History No family history on file.  Social History Social History   Tobacco Use  . Smoking status: Never Smoker  . Smokeless tobacco: Never Used  Substance Use Topics  . Alcohol use: No  . Drug use: Not on file     Allergies   Amoxicillin   Review of Systems Review of Systems  Musculoskeletal: Positive for myalgias.  Neurological: Negative for numbness.  Hematological: Does not bruise/bleed easily.     Physical Exam Updated Vital Signs BP (!) 135/98 (BP Location: Right Arm)   Pulse 63   Temp 98.1 F (36.7 C) (Oral)   Resp 20   Ht 5\' 4"  (1.626 m)   Wt 95.3 kg (210 lb)   SpO2 100%   BMI 36.05 kg/m   Physical Exam  Constitutional: She is oriented to person, place, and time. She appears well-developed and well-nourished. No distress.  HENT:  Head: Normocephalic and atraumatic.  Eyes: EOM are normal.  Neck: Normal range of motion.  Pulmonary/Chest: Effort normal.  Abdominal: She exhibits no distension.  Musculoskeletal: She exhibits tenderness.  Tenderness palpation of belly of medial gastrocnemius.  No tenderness to palpation of the knee or joint line.  No tenderness  to palpation of the ankle.  Achilles tendon intact.  No redness or swelling.  Compartment is soft.  Popliteal and pedal pulses intact bilaterally.  Sensation intact bilaterally.  Neurological: She is alert and oriented to person, place, and time.  Skin: Skin is warm. No rash noted.  Psychiatric: She has a normal mood and affect.  Nursing note and vitals reviewed.    ED Treatments / Results  Labs (all labs ordered are listed, but only abnormal results are displayed) Labs Reviewed - No data to display  EKG  EKG Interpretation None       Radiology No results found.  Procedures Procedures (including critical care time)  Medications Ordered in ED Medications  ibuprofen (ADVIL,MOTRIN) tablet 800 mg (800 mg Oral Given 10/08/17 0004)       Initial Impression / Assessment and Plan / ED Course  I have reviewed the triage vital signs and the nursing notes.  Pertinent labs & imaging results that were available during my care of the patient were reviewed by me and considered in my medical decision making (see chart for details).     Patient presenting with left calf pain.  Physical exam reassuring, neurovascularly intact.  Low risk for DVT, and clinically I do not believe this is a DVT.  Patient is requesting ultrasound for verification, will arrange outpatient ultrasound for tomorrow.  I do not believe further imaging is indicated tonight.  Likely muscular pain.  Instructed NSAIDs and muscle relaxer for symptom control.  Patient states she cannot take ibuprofen due to history of gastric bypass, will prescribe Voltaren gel.  Patient to follow-up with orthopedics.  At this time, patient appears safe for discharge.  Return precautions given.  Patient states she understands and agrees to plan.   Final Clinical Impressions(s) / ED Diagnoses   Final diagnoses:  Left leg pain    ED Discharge Orders        Ordered    US Venous Img Lower Unilateral Left     10/08/17 0003    ibuprofen (ADVIL,MOTRIN) 800 MG tablet  3 times daily with meals,   Status:  Discontinued     10/08/17 0003    cyclobenzaprine (FLEXERIL) 5 MG tablet  At bedtime PRN,   Status:  Discontinued     10/08/17 0003    cyclobenzaprine (FLEXERIL) 5 MG tablet  At bedtime PRN     10/08/17 0020    diclofenac sodium (VOLTAREN) 1 % GEL  4 times daily     10/08/17 0020       Alveria ApleyCaccavale, Calie Buttrey, PA-C 10/08/17 0031    Palumbo, April, MD 10/08/17 [phone]

## 2017-11-09 ENCOUNTER — Ambulatory Visit: Payer: Self-pay | Admitting: Family Medicine

## 2019-02-28 IMAGING — US US EXTREM LOW VENOUS*L*
1 series · 13 of 24 positions shown · non-contrast
Comparison: None.

CLINICAL DATA: Left calf pain and edema.



[Series 1: us extrem low venous*left* · 0.08mm/px · 13 of 42 slices shown]
[im 1/42]
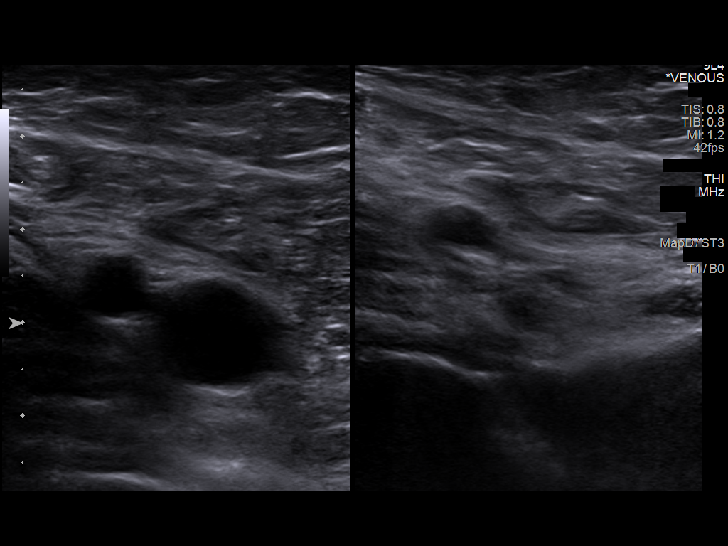
[im 4/42]
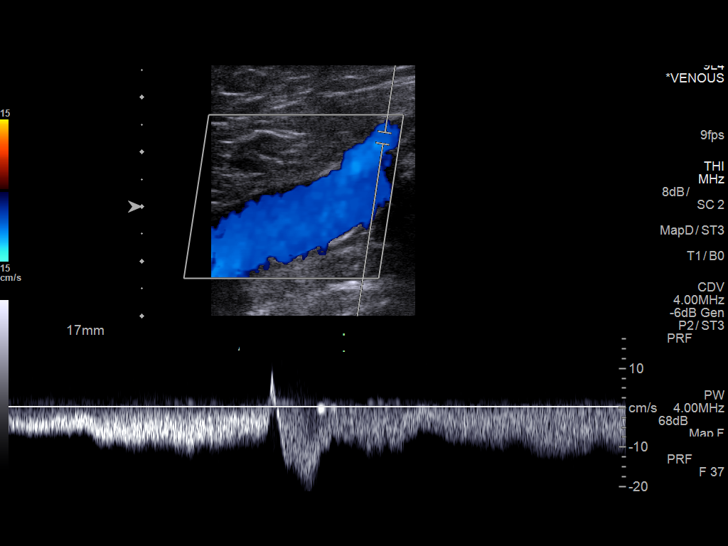
[im 8/42]
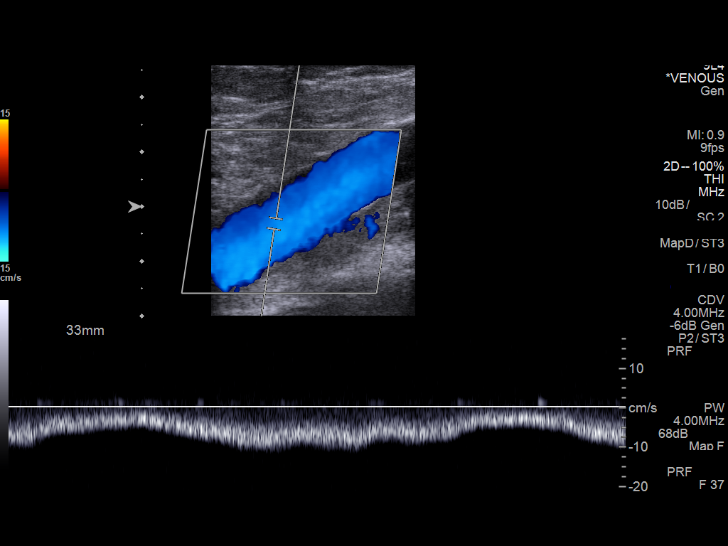
[im 11/42]
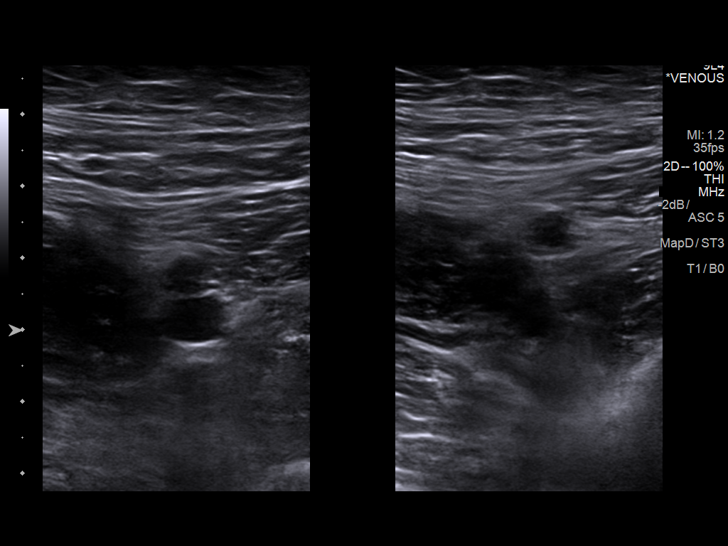
[im 15/42]
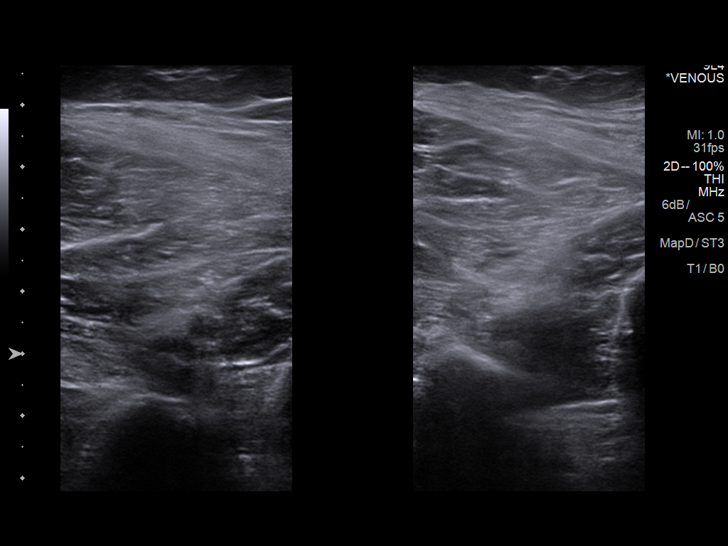
[im 18/42]
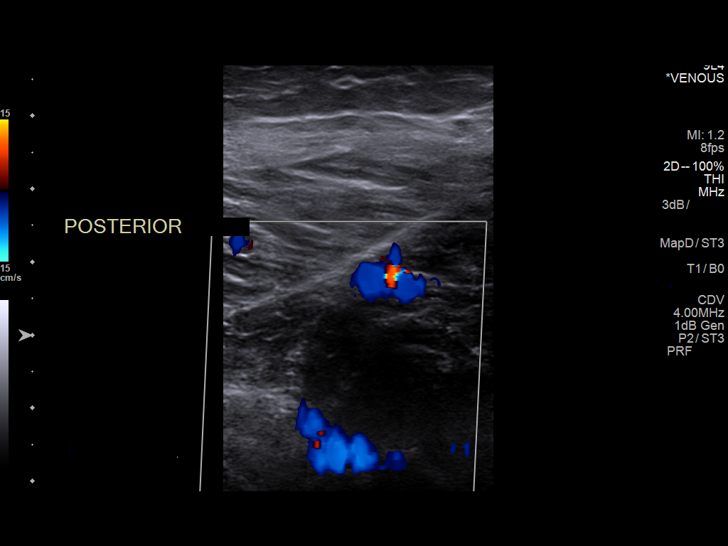
[im 22/42]
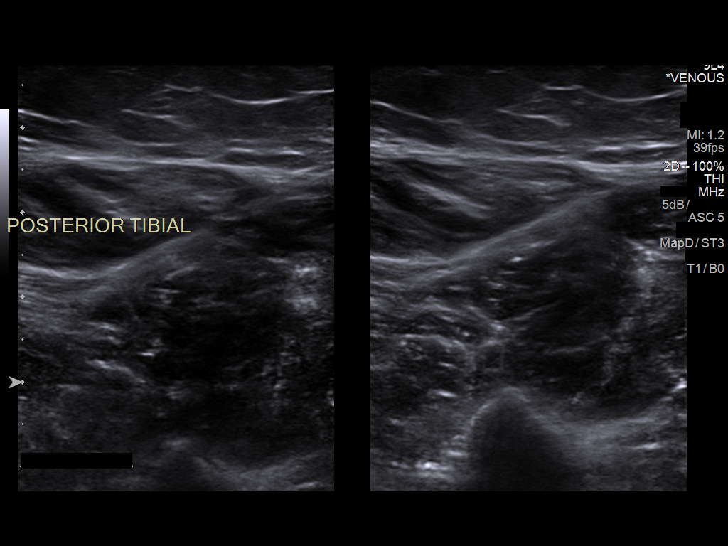
[im 24/42]
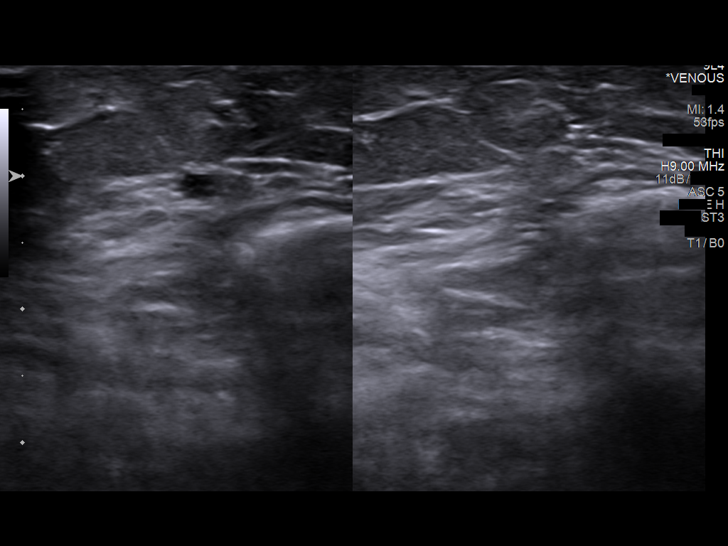
[im 27/42]
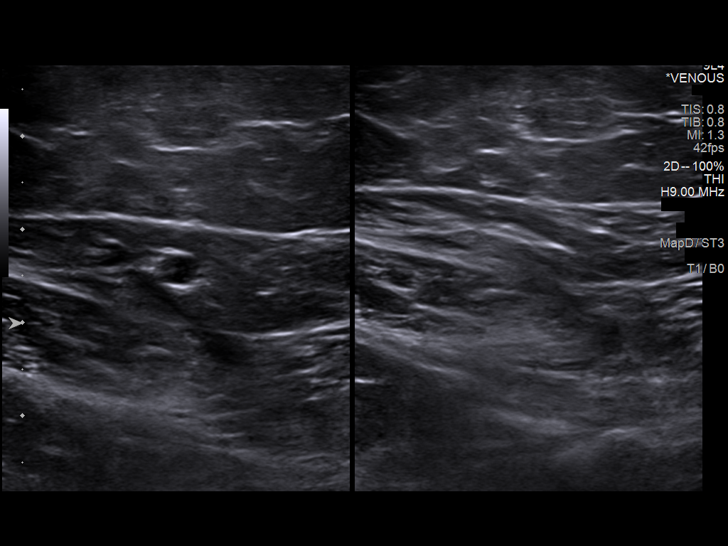
[im 31/42]
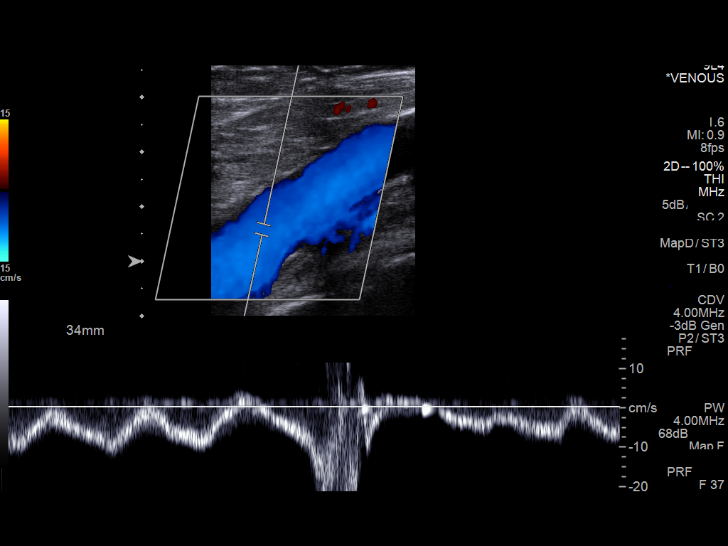
[im 34/42]
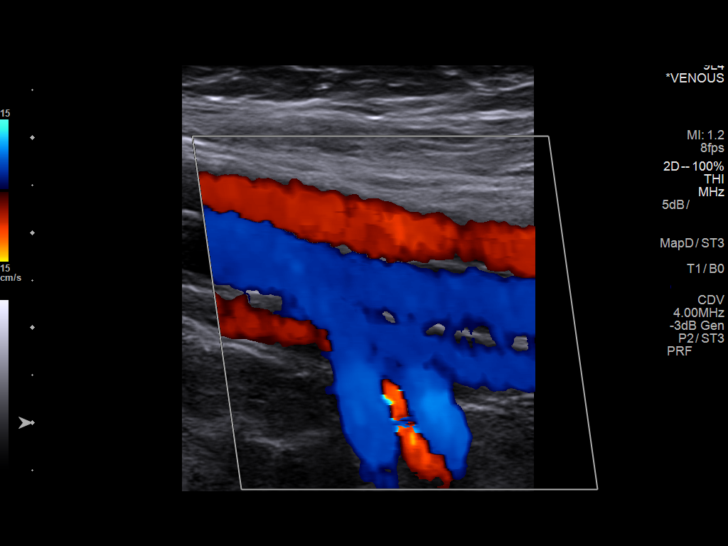
[im 38/42]
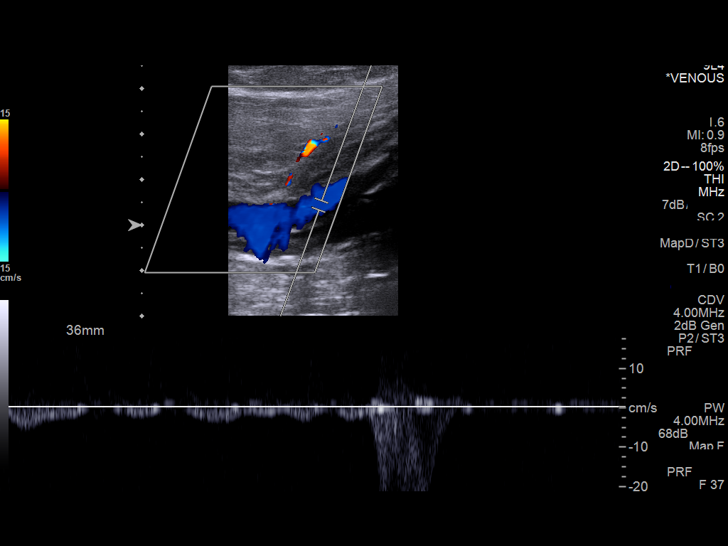
[im 42/42]
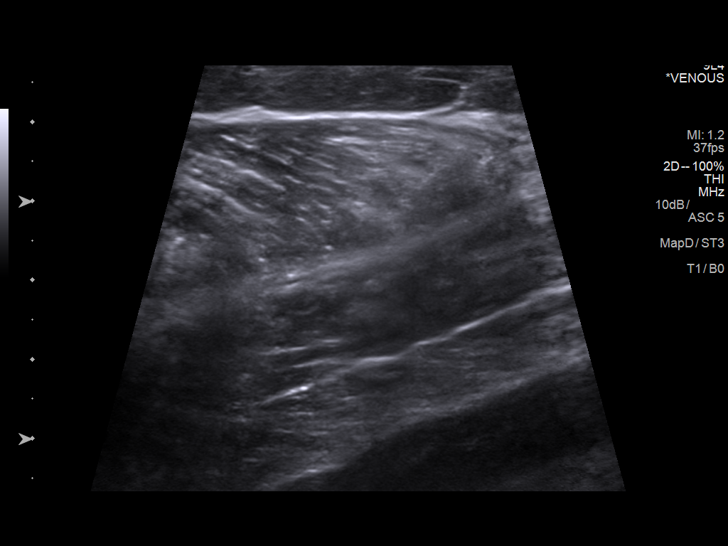

[13 of 24 positions shown; findings below may reference images not displayed]

FINDINGS: Contralateral Common Femoral Vein: Respiratory phasicity is normal
and symmetric with the symptomatic side. No evidence of thrombus.
Normal compressibility.

Common Femoral Vein: No evidence of thrombus. Normal
compressibility, respiratory phasicity and response to augmentation.

Saphenofemoral Junction: No evidence of thrombus. Normal
compressibility and flow on color Doppler imaging.

Profunda Femoral Vein: No evidence of thrombus. Normal
compressibility and flow on color Doppler imaging.

Femoral Vein: No evidence of thrombus. Normal compressibility,
respiratory phasicity and response to augmentation.

Popliteal Vein: No evidence of thrombus. Normal compressibility,
respiratory phasicity and response to augmentation.

Calf Veins: No evidence of thrombus. Normal compressibility and flow
on color Doppler imaging.

Superficial Great Saphenous Vein: No evidence of thrombus. Normal
compressibility.

Venous Reflux:  None.

Other Findings: No evidence of superficial thrombophlebitis or
abnormal fluid collection.
IMPRESSION: No evidence of left lower extremity deep venous thrombosis.

## 2020-05-08 NOTE — Telephone Encounter (Signed)
Received a call patient wanting to see Dr. Allena Katz for chest pain.   He states OK with appt on 05/09/2020 at 1300.  She confirmed appt date, time and location.

## 2020-05-09 ENCOUNTER — Ambulatory Visit
Admit: 2020-05-09 | Discharge: 2020-05-09 | Payer: PRIVATE HEALTH INSURANCE | Attending: Internal Medicine | Primary: Student in an Organized Health Care Education/Training Program

## 2020-05-09 ENCOUNTER — Encounter
Admit: 2020-05-09 | Payer: PRIVATE HEALTH INSURANCE | Primary: Student in an Organized Health Care Education/Training Program

## 2020-05-09 DIAGNOSIS — R079 Chest pain, unspecified: Secondary | ICD-10-CM

## 2020-05-24 ENCOUNTER — Encounter

## 2020-05-30 ENCOUNTER — Inpatient Hospital Stay
Admit: 2020-05-30 | Payer: PRIVATE HEALTH INSURANCE | Primary: Student in an Organized Health Care Education/Training Program

## 2020-05-30 DIAGNOSIS — R079 Chest pain, unspecified: Secondary | ICD-10-CM

## 2020-05-30 MED ORDER — TECHNETIUM TC 99M SESTAMIBI - CARDIOLITE
Freq: Once | Status: AC | PRN
Start: 2020-05-30 — End: 2020-05-30
  Administered 2020-05-30: 12:00:00 9.2 via INTRAVENOUS

## 2020-05-30 MED ORDER — TECHNETIUM TC 99M SESTAMIBI - CARDIOLITE
Freq: Once | Status: AC | PRN
Start: 2020-05-30 — End: 2020-05-30
  Administered 2020-05-30: 13:00:00 32.3 via INTRAVENOUS

## 2020-05-30 MED ORDER — REGADENOSON 0.4 MG/5ML IV SOLN
0.4 | INTRAVENOUS | Status: AC
Start: 2020-05-30 — End: 2020-05-30

## 2020-05-30 MED FILL — LEXISCAN 0.4 MG/5ML IV SOLN: 0.4 MG/5ML | INTRAVENOUS | Qty: 5

## 2020-06-07 ENCOUNTER — Inpatient Hospital Stay
Admit: 2020-06-07 | Payer: PRIVATE HEALTH INSURANCE | Primary: Student in an Organized Health Care Education/Training Program

## 2020-06-07 DIAGNOSIS — R1111 Vomiting without nausea: Secondary | ICD-10-CM

## 2020-06-07 MED ORDER — BARIUM SULFATE 0.1 % PO SUSP
0.1 % | Freq: Once | ORAL | Status: AC | PRN
Start: 2020-06-07 — End: 2020-06-07
  Administered 2020-06-07: 17:00:00 1500 mL via ORAL

## 2020-06-07 MED ORDER — IOPAMIDOL 76 % IV SOLN
76 % | Freq: Once | INTRAVENOUS | Status: AC | PRN
Start: 2020-06-07 — End: 2020-06-07
  Administered 2020-06-07: 17:00:00 85 mL via INTRAVENOUS

## 2020-06-19 ENCOUNTER — Encounter: Admit: 2020-06-19 | Primary: Student in an Organized Health Care Education/Training Program

## 2020-06-20 LAB — ROTAVIRUS ANTIGEN, STOOL: Rotavirus: NEGATIVE

## 2020-06-20 LAB — GASTROINTESTINAL PANEL, MOLECULAR
Adenovirus F 40 41 PCR: NOT DETECTED
Astrovirus PCR: NOT DETECTED
Campylobacter PCR: NOT DETECTED
Clostridium difficile, PCR: NOT DETECTED
Cryptosporidium PCR: NOT DETECTED
Cyclospora Cayetanensis PCR: NOT DETECTED
E Coli Enteroaggregative PCR: NOT DETECTED
E Coli Enteropathogenic PCR: NOT DETECTED
E Coli Enterotoxigenic PCR: NOT DETECTED
E Coli Shiga Like Toxin PCR: NOT DETECTED
E Coli Shigella/Enteroinvasive PCR: NOT DETECTED
E HISTOLYTICA GI FILM ARRAY: NOT DETECTED
Giardia Lamblia PCR: NOT DETECTED
Norovirus GI GII PCR: NOT DETECTED
Plesiomonas Shigelloides PCR: NOT DETECTED
Rotavirus A PCR: NOT DETECTED
Salmonella PCR: NOT DETECTED
Sapovirus PCR: NOT DETECTED
Vibrio Cholerae PCR: NOT DETECTED
Vibrio PCR: NOT DETECTED
Yersinia Enterocolitica PCR: NOT DETECTED

## 2020-06-20 LAB — FECAL LEUKOCYTES

## 2020-06-21 LAB — GIARDIA ANTIGEN

## 2020-06-21 LAB — C. DIFFICILE TOXIN MOLECULAR

## 2020-06-21 LAB — TISSUE TRANSGLUTAMINASE, IGG: TTG, IgG: <0.6 U/mL (ref <7.0)

## 2020-06-21 LAB — TISSUE TRANSGLUTAMINASE, IGA: Tissue Transglutaminase IgA: 0.3 U/mL (ref <7.0)

## 2020-06-21 LAB — CULTURE, STOOL

## 2020-06-22 LAB — CRYPTOSPORIDIUM ANTIGEN, STOOL: Cryptococcus Ag Source: NEGATIVE

## 2020-06-22 LAB — FECAL FAT, QUALITATIVE

## 2020-06-22 LAB — ENTAMOEBA HISTOLYTICA ANTIGEN, EIA: ENTAMOEBA HISTOLYTICA ANTIGEN: NEGATIVE

## 2020-06-22 LAB — CULTURE, CAMPYLOBACTER, STOOL: Campylobacter Culture: NEGATIVE

## 2020-06-23 LAB — CALPROTECTIN STOOL: Calprotectin: 107 ug/g — ABNORMAL HIGH (ref <=49)

## 2020-06-23 LAB — PANCREATIC ELASTASE, FECAL: Pancreatic Elastase, Fecal: 434 ug/g (ref >=100)

## 2020-06-25 LAB — YERSINIA IGG IMMUNOBLOT: Yersinia IGG By Immunoblot: POSITIVE — AB

## 2020-06-25 LAB — YERSINIA IGM BY IMMUNOBLOT: Yersinia IGM Immunoblot: NEGATIVE

## 2020-08-01 ENCOUNTER — Inpatient Hospital Stay
Admit: 2020-08-01 | Discharge: 2020-08-01 | Disposition: A | Discharge status: Home / Self Care | Payer: PRIVATE HEALTH INSURANCE | Attending: Chiropractor

## 2020-08-01 ENCOUNTER — Emergency Department
Admit: 2020-08-01 | Payer: PRIVATE HEALTH INSURANCE | Primary: Student in an Organized Health Care Education/Training Program

## 2020-08-01 DIAGNOSIS — R1013 Epigastric pain: Secondary | ICD-10-CM

## 2020-08-01 LAB — CBC WITH AUTO DIFFERENTIAL
Basophils Absolute: 0.1 10*3/uL (ref 0.0–0.1)
Basophils: 1.1 %
Eosinophils Absolute: 0.2 10*3/uL (ref 0.0–0.4)
Eosinophils: 3.7 %
Hematocrit: 35.7 % — ABNORMAL LOW (ref 37.0–47.0)
Hemoglobin: 10.9 gm/dl — ABNORMAL LOW (ref 12.0–16.0)
Immature Grans (Abs): 0.01 10*3/uL (ref 0.00–0.07)
Immature Granulocytes: 0.2 %
Lymphocytes Absolute: 2.3 10*3/uL (ref 1.0–4.8)
Lymphocytes: 50.2 %
MCH: 27.3 pg (ref 26.0–33.0)
MCHC: 30.5 gm/dl — ABNORMAL LOW (ref 32.2–35.5)
MCV: 89.5 fL (ref 81.0–99.0)
MPV: 10 fL (ref 9.4–12.4)
Monocytes Absolute: 0.3 10*3/uL — ABNORMAL LOW (ref 0.4–1.3)
Monocytes: 7.4 %
Platelets: 434 10*3/uL — ABNORMAL HIGH (ref 130–400)
RBC: 3.99 10*6/uL — ABNORMAL LOW (ref 4.20–5.40)
RDW-CV: 14 % (ref 11.5–14.5)
RDW-SD: 45.9 fL — ABNORMAL HIGH (ref 35.0–45.0)
Seg Neutrophils: 37.4 %
Segs Absolute: 1.7 10*3/uL — ABNORMAL LOW (ref 1.8–7.7)
WBC: 4.6 10*3/uL — ABNORMAL LOW (ref 4.8–10.8)
nRBC: 0 /100 wbc

## 2020-08-01 LAB — URINALYSIS WITH REFLEX TO CULTURE
Bilirubin Urine: NEGATIVE
Blood, Urine: NEGATIVE
Glucose, Ur: NEGATIVE mg/dl
Leukocyte Esterase, Urine: NEGATIVE
Nitrite, Urine: NEGATIVE
Protein, UA: NEGATIVE
Specific Gravity, Urine: 1.023 (ref 1.002–1.030)
Urobilinogen, Urine: 1 eu/dl (ref 0.0–1.0)
pH, UA: 5 (ref 5.0–9.0)

## 2020-08-01 LAB — TROPONIN: Troponin T: <0.01 ng/ml

## 2020-08-01 LAB — HEPATIC FUNCTION PANEL
ALT: 14 U/L (ref 11–66)
AST: 22 U/L (ref 5–40)
Albumin: 3.8 g/dL (ref 3.5–5.1)
Alkaline Phosphatase: 60 U/L (ref 38–126)
Bilirubin, Direct: <0.2 mg/dL (ref 0.0–0.3)
Total Bilirubin: 0.2 mg/dL — ABNORMAL LOW (ref 0.3–1.2)
Total Protein: 6.7 g/dL (ref 6.1–8.0)

## 2020-08-01 LAB — BASIC METABOLIC PANEL
BUN: 10 mg/dL (ref 7–22)
CO2: 24 meq/L (ref 23–33)
Calcium: 8.8 mg/dL (ref 8.5–10.5)
Chloride: 107 meq/L (ref 98–111)
Creatinine: 1 mg/dL (ref 0.4–1.2)
Glucose: 93 mg/dL (ref 70–108)
Potassium: 4.4 meq/L (ref 3.5–5.2)
Sodium: 140 meq/L (ref 135–145)

## 2020-08-01 LAB — MAGNESIUM: Magnesium: 2 mg/dL (ref 1.6–2.4)

## 2020-08-01 LAB — OSMOLALITY: Osmolality Calc: 278.1 mOsmol/kg (ref 275.0–300.0)

## 2020-08-01 LAB — ANION GAP: Anion Gap: 9 meq/L (ref 8.0–16.0)

## 2020-08-01 LAB — LIPASE: Lipase: 34 U/L (ref 5.6–51.3)

## 2020-08-01 MED ORDER — MORPHINE SULFATE 4 MG/ML IJ SOLN
4 MG/ML | Freq: Once | INTRAMUSCULAR | Status: AC
Start: 2020-08-01 — End: 2020-08-01
  Administered 2020-08-01: 10:00:00 4 mg via INTRAVENOUS

## 2020-08-01 MED ORDER — LIDOCAINE VISCOUS HCL 2 % MT SOLN
2 % | Freq: Once | OROMUCOSAL | Status: AC
Start: 2020-08-01 — End: 2020-08-01
  Administered 2020-08-01: 10:00:00 via ORAL

## 2020-08-01 MED ORDER — PANTOPRAZOLE SODIUM 40 MG PO TBEC
40 MG | Freq: Once | ORAL | Status: AC
Start: 2020-08-01 — End: 2020-08-01
  Administered 2020-08-01: 10:00:00 40 mg via ORAL

## 2020-08-01 MED ORDER — IOPAMIDOL 76 % IV SOLN
76 % | Freq: Once | INTRAVENOUS | Status: AC | PRN
Start: 2020-08-01 — End: 2020-08-01
  Administered 2020-08-01: 11:00:00 80 mL via INTRAVENOUS

## 2020-08-01 MED ORDER — ONDANSETRON 4 MG PO TBDP
4 MG | Freq: Once | ORAL | Status: AC
Start: 2020-08-01 — End: 2020-08-01
  Administered 2020-08-01: 10:00:00 4 mg via ORAL

## 2020-08-01 MED FILL — MAG-AL PLUS 200-200-20 MG/5ML PO LIQD: 200-200-20 MG/5ML | ORAL | Qty: 30

## 2020-08-01 MED FILL — MORPHINE SULFATE 4 MG/ML IJ SOLN: 4 mg/mL | INTRAMUSCULAR | Qty: 1

## 2020-08-01 MED FILL — PANTOPRAZOLE SODIUM 40 MG PO TBEC: 40 mg | ORAL | Qty: 1

## 2020-08-01 MED FILL — ONDANSETRON 4 MG PO TBDP: 4 mg | ORAL | Qty: 1

## 2020-08-01 NOTE — ED Triage Notes (Signed)
Pt arrives to ED with c/o abdominal pain in her mid-RLQ. Pt states the pain began about 0300 with nausea and has since gotten worse. She states nothing makes it feel better. The pain feels sharp and constant. Pt rates her pain 7/10 at this time.   VSS, AOx4. Denies any chest pain or SOB at this time.   Pt resting in bed

## 2020-08-01 NOTE — ED Notes (Signed)
Patient resting in bed. Respirations easy and unlabored. No distress noted. Call light within reach.  EKG, labs, UA sent at this time       Reginia Forts, RN  08/01/20 0600

## 2020-08-01 NOTE — ED Notes (Signed)
Patient resting in bed. Respirations easy and unlabored. No distress noted. Call light within reach.  Pt down to CT at this time     Reginia Forts, RN  08/01/20 [phone]

## 2020-08-01 NOTE — ED Provider Notes (Signed)
[facility]  eMERGENCY dEPARTMENT eNCOUnter          CHIEF COMPLAINT       Chief Complaint   Patient presents with   ??? Abdominal Pain     LUQ       Nurses Notes reviewed and I agree except as noted in the HPI.      HISTORY OF PRESENT ILLNESS    [name] is a 39 y.o. female who presents for epigastric pain.  She sees Dr. Donette Larry with [facility].  She has not had a diagnosis as of yet.  She does have GERD.  Patient states that for the last day and a half she has had a really bad.  She states she is taking all her medications as prescribed.  She denies any drugs or alcohol.  She has some nausea without vomiting.  She has no changes in bowel or bladder habits.  No blood in the stool or blood in the urine.  Patient has no chest pain or shortness of breath.  Currently the patient is resting comfortably on the cot in no apparent distress mild to moderate amount of pain.    REVIEW OF SYSTEMS     Review of Systems   Constitutional: Negative for activity change, appetite change, diaphoresis, fatigue and unexpected weight change.   HENT: Negative for congestion, ear discharge, ear pain, hearing loss, rhinorrhea, sinus pressure, sore throat, trouble swallowing and voice change.    Eyes: Negative for photophobia, pain, discharge, redness and itching.   Respiratory: Negative for cough, choking, chest tightness, shortness of breath and wheezing.    Cardiovascular: Negative for chest pain, palpitations and leg swelling.   Gastrointestinal: Positive for abdominal pain and nausea. Negative for abdominal distention, blood in stool, constipation, diarrhea and vomiting.   Endocrine: Negative for polydipsia, polyphagia and polyuria.   Genitourinary: Negative for decreased urine volume, difficulty urinating, dysuria, enuresis, frequency, hematuria and urgency.   Musculoskeletal: Negative for arthralgias, back pain, gait problem, myalgias, neck pain and neck stiffness.   Skin: Negative for pallor  and rash.   Allergic/Immunologic: Negative for immunocompromised state.   Neurological: Negative for dizziness, tremors, seizures, syncope, facial asymmetry, weakness, light-headedness, numbness and headaches.   Hematological: Negative for adenopathy. Does not bruise/bleed easily.   Psychiatric/Behavioral: Negative for agitation, hallucinations and suicidal ideas. The patient is not nervous/anxious.          PAST MEDICAL HISTORY    has a past medical history of Anemia, Chronic back pain, Deficiency of multiple nutrient elements, Deficiency of multiple vitamins, Fatigue, GERD (gastroesophageal reflux disease), Intestinal malabsorption, Irregular menstrual cycle, Knee pain, and SOB (shortness of breath).    SURGICAL HISTORY      has a past surgical history that includes Cesarean section (2005); Hysterectomy (2012); salpingectomy (2008); Cholecystectomy (2006); Gastric bypass surgery (2014); and Breast lumpectomy (Left, 2000).    CURRENT MEDICATIONS       Previous Medications    FAMOTIDINE (PEPCID) 40 MG TABLET    Take 40 mg by mouth daily    PROMETHAZINE (PHENERGAN) 12.5 MG TABLET    Take 12.5 mg by mouth every 6 hours as needed for Nausea    SUCRALFATE (CARAFATE) 1 GM TABLET    Take 1 g by mouth 4 times daily    VITAMIN B-1 (THIAMINE) 100 MG TABLET    Take 100 mg by mouth daily    VITAMIN B-12 (CYANOCOBALAMIN) 1000 MCG TABLET    Place 1,000 mcg under  the tongue once a week Indications: SUPPLEMENT    VITAMIN D PO    Take by mouth       ALLERGIES     is allergic to amoxicillin, pcn [penicillins], and zoloft [sertraline hcl].    FAMILY HISTORY     She indicated that her mother is deceased. She indicated that her father is alive. She indicated that her sister is alive. She indicated that her brother is alive. She indicated that her daughter is alive. She indicated that her son is alive. She indicated that the status of her other is unknown.   family history includes Breast Cancer in an other family member; Diabetes in  her father; Heart Disease in her mother; High Blood Pressure in her father, mother, and sister; Mental Illness in her father, mother, and sister; Thyroid Disease in her mother and sister.    SOCIAL HISTORY      reports that she has never smoked. She has never used smokeless tobacco. She reports current alcohol use. She reports that she does not use drugs.    PHYSICAL EXAM     INITIAL VITALS:  height is 5\' 4"  (1.626 m) and weight is 145 lb (65.8 kg). Her oral temperature is 98.4 ??F (36.9 ??C). Her blood pressure is 128/83 and her pulse is 71. Her respiration is 16 and oxygen saturation is 100%.    Physical Exam  Vitals and nursing note reviewed.   Constitutional:       General: She is not in acute distress.     Appearance: She is well-developed. She is not diaphoretic.   HENT:      Head: Normocephalic and atraumatic.      Right Ear: External ear normal.      Left Ear: External ear normal.      Nose: Nose normal.      Mouth/Throat:      Pharynx: No oropharyngeal exudate.   Eyes:      General: No scleral icterus.        Right eye: No discharge.         Left eye: No discharge.      Conjunctiva/sclera: Conjunctivae normal.      Pupils: Pupils are equal, round, and reactive to light.   Neck:      Thyroid: No thyromegaly.      Vascular: No JVD.      Trachea: No tracheal deviation.   Cardiovascular:      Rate and Rhythm: Normal rate and regular rhythm.      Pulses:           Carotid pulses are 2+ on the right side and 2+ on the left side.       Radial pulses are 2+ on the right side and 2+ on the left side.        Femoral pulses are 2+ on the right side and 2+ on the left side.       Popliteal pulses are 2+ on the right side and 2+ on the left side.        Dorsalis pedis pulses are 2+ on the right side and 2+ on the left side.        Posterior tibial pulses are 2+ on the right side and 2+ on the left side.      Heart sounds: Normal heart sounds, S1 normal and S2 normal. No murmur heard.   No friction rub. No gallop.     Pulmonary:      Effort:  Pulmonary effort is normal.      Breath sounds: Normal breath sounds. No stridor. No decreased breath sounds, wheezing, rhonchi or rales.   Chest:      Chest wall: No tenderness.   Abdominal:      General: Bowel sounds are normal. There is no distension.      Palpations: Abdomen is soft. There is no mass.      Tenderness: There is abdominal tenderness in the epigastric area. There is no guarding or rebound.      Hernia: No hernia is present.   Musculoskeletal:         General: No tenderness. Normal range of motion.      Cervical back: Normal range of motion and neck supple.      Right lower leg: No edema.      Left lower leg: No edema.   Lymphadenopathy:      Cervical: No cervical adenopathy.   Skin:     General: Skin is warm and dry.      Capillary Refill: Capillary refill takes less than 2 seconds.      Findings: No bruising, ecchymosis, lesion or rash.   Neurological:      General: No focal deficit present.      Mental Status: She is alert and oriented to person, place, and time. Mental status is at baseline.      Cranial Nerves: No cranial nerve deficit.      Sensory: No sensory deficit.      Motor: No weakness.      Coordination: Coordination normal.      Gait: Gait normal.      Deep Tendon Reflexes: Reflexes are normal and symmetric. Reflexes normal.   Psychiatric:         Speech: Speech normal.         Behavior: Behavior normal.         Thought Content: Thought content normal.         Judgment: Judgment normal.           DIFFERENTIAL DIAGNOSIS:   Gastritis gastroenteritis less likely small bowel obstruction    DIAGNOSTIC RESULTS     EKG: All EKG's are interpreted by the Emergency Department Physician who either signs or Co-signs this chart in the absence of a cardiologist.  EKG reveals sinus bradycardia normal axis, ventricular rate of 59 PR interval 162 QRS duration of 84 QT interval 424 QTC of 419.    RADIOLOGY: non-plain film images(s) such as CT, Ultrasound and MRI are read by  the radiologist.  CT ABDOMEN PELVIS W IV CONTRAST Additional Contrast? None    (Results Pending)         LABS:   Labs Reviewed   CBC WITH AUTO DIFFERENTIAL - Abnormal; Notable for the following components:       Result Value    WBC 4.6 (*)     RBC 3.99 (*)     Hemoglobin 10.9 (*)     Hematocrit 35.7 (*)     MCHC 30.5 (*)     RDW-SD 45.9 (*)     Platelets 434 (*)     Segs Absolute 1.7 (*)     Monocytes Absolute 0.3 (*)     All other components within normal limits   HEPATIC FUNCTION PANEL - Abnormal; Notable for the following components:    Total Bilirubin 0.2 (*)     All other components within normal limits   URINE RT REFLEX TO CULTURE - Abnormal; Notable  for the following components:    Ketones, Urine TRACE (*)     All other components within normal limits   GLOMERULAR FILTRATION RATE, ESTIMATED - Abnormal; Notable for the following components:    Est, Glom Filt Rate 75 (*)     All other components within normal limits   BASIC METABOLIC PANEL   LIPASE   MAGNESIUM   TROPONIN   ANION GAP   OSMOLALITY       EMERGENCY DEPARTMENT COURSE:   Vitals:    Vitals:    08/01/20 0524   BP: 128/83   Pulse: 71   Resp: 16   Temp: 98.4 ??F (36.9 ??C)   TempSrc: Oral   SpO2: 100%   Weight: 145 lb (65.8 kg)   Height: 5\' 4"  (1.626 m)     Patient was assessed at bedside appropriate labs and imaging were ordered.  Patient was given Zofran Protonix GI cocktail and some morphine.  Reviewed the patient's labs they were all within normal limits.  At this point I came to the end of my shift CT examination and not been performed yet.  Patient is signed out to my morning colleague in stable condition.    CRITICAL CARE:   None    CONSULTS:  None    PROCEDURES:  None    FINAL IMPRESSION      1. Abdominal pain, epigastric          DISPOSITION/PLAN   ED observation/signout    PATIENT REFERRED TO:  No follow-up provider specified.    DISCHARGE MEDICATIONS:  New Prescriptions    No medications on file       (Please note that portions of this note were  completed with a voice recognition program.  Efforts were made to edit the dictations but occasionally words are mis-transcribed.)    , DO            Patrina Levering, DO  08/01/20 [phone]

## 2020-08-01 NOTE — ED Notes (Signed)
Patient resting in bed. Respirations easy and unlabored. No distress noted. Call light within reach.       Sheffield Slider, RN  08/01/20 [phone]

## 2020-08-01 NOTE — ED Provider Notes (Addendum)
[facility]  Emergency Department     Care of [name] was assumed from previous ED provider. The patient's initial evaluation and plan have been discussed with the ED prior provider who initially cared for the patient .  All pertinent data has been reviewed.  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  It is intermittent in nature, comes every several months, persists intermittently for a few weeks at a time.  Today she developed an exacerbation.  Is nonradiating, is associate with some nausea but no emesis.  No fever or chills.  No dysuria or hematuria.   She is also under the care of GI for these epigastric pain episodes that she developed several years ago, she states started soon after her Roux-en-Y bypass.  She has not followed up with her surgeon for these concerns.  She recently underwent an colonoscopy where a polyp was found.  Recent EGD also revealed no evidence of gastritis or ulceration.  Labs and CT scan were ordered by the previous physician.  Patient was also medicated and states her pain now is mild, about a 3 out of 10 with some mild chronic for her nausea.  I am asked to follow-up on imaging and labs and disposition the patient    On my examination, is resting comfortably, in no acute distress, she is pleasant and well-appearing      EMERGENCY DEPARTMENT COURSE / MEDICAL DECISION MAKING:       MEDICATIONS GIVEN:  Orders Placed This Encounter   Medications   ??? morphine injection 4 mg   ??? ondansetron (ZOFRAN-ODT) disintegrating tablet 4 mg   ??? pantoprazole (PROTONIX) tablet 40 mg   ??? aluminum & magnesium hydroxide-simethicone (MAALOX) 30 mL, lidocaine viscous hcl (XYLOCAINE) 5 mL (GI COCKTAIL)   ??? iopamidol (ISOVUE-370) 76 % injection 80 mL     LABS / RADIOLOGY:     Results for orders placed or performed during the hospital encounter of 08/01/20   CBC auto differential   Result Value Ref Range     WBC 4.6 (L) 4.8 - 10.8 thou/mm3    RBC 3.99 (L) 4.20 - 5.40 mill/mm3    Hemoglobin 10.9 (L) 12.0 - 16.0 gm/dl    Hematocrit 49.7 (L) 37.0 - 47.0 %    MCV 89.5 81.0 - 99.0 fL    MCH 27.3 26.0 - 33.0 pg    MCHC 30.5 (L) 32.2 - 35.5 gm/dl    RDW-CV 53.0 05.1 - 10.2 %    RDW-SD 45.9 (H) 35.0 - 45.0 fL    Platelets 434 (H) 130 - 400 thou/mm3    MPV 10.0 9.4 - 12.4 fL    Seg Neutrophils 37.4 %    Lymphocytes 50.2 %    Monocytes 7.4 %    Eosinophils 3.7 %    Basophils 1.1 %    Immature Granulocytes 0.2 %    Segs Absolute 1.7 (L) 1 - 7 thou/mm3    Lymphocytes Absolute 2.3 1.0 - 4.8 thou/mm3    Monocytes Absolute 0.3 (L) 0.4 - 1.3 thou/mm3    Eosinophils Absolute 0.2 0.0 - 0.4 thou/mm3    Basophils Absolute 0.1 0.0 - 0.1 thou/mm3    Immature Grans (Abs) 0.01 0.00 - 0.07 thou/mm3    nRBC 0 /100 wbc   Basic Metabolic Panel   Result Value Ref Range    Sodium 140 135 - 145  meq/L    Potassium 4.4 3.5 - 5.2 meq/L    Chloride 107 98 - 111 meq/L    CO2 24 23 - 33 meq/L    Glucose 93 70 - 108 mg/dL    BUN 10 7 - 22 mg/dL    CREATININE 1.0 0.4 - 1.2 mg/dL    Calcium 8.8 8.5 - 50.5 mg/dL   Hepatic function panel   Result Value Ref Range    Albumin 3.8 3.5 - 5.1 g/dL    Total Bilirubin 0.2 (L) 0.3 - 1.2 mg/dL    Bilirubin, Direct <6.9 0.0 - 0.3 mg/dL    Alkaline Phosphatase 60 38 - 126 U/L    AST 22 5 - 40 U/L    ALT 14 11 - 66 U/L    Total Protein 6.7 6.1 - 8.0 g/dL   Lipase   Result Value Ref Range    Lipase 34.0 5.6 - 51.3 U/L   Magnesium   Result Value Ref Range    Magnesium 2.0 1.6 - 2.4 mg/dL   Troponin   Result Value Ref Range    Troponin T < 0.010 ng/ml   Urine reflex to culture    Specimen: Urine, clean catch   Result Value Ref Range    Glucose, Ur NEGATIVE NEGATIVE mg/dl    Bilirubin Urine NEGATIVE NEGATIVE    Ketones, Urine TRACE (A) NEGATIVE    Specific Gravity, Urine 1.023 1.002 - 1.030    Blood, Urine NEGATIVE NEGATIVE    pH, UA 5.0 5.0 - 9.0    Protein, UA NEGATIVE NEGATIVE    Urobilinogen, Urine 1.0 0.0 - 1.0 eu/dl     Nitrite, Urine NEGATIVE NEGATIVE    Leukocyte Esterase, Urine NEGATIVE NEGATIVE    Color, UA YELLOW STRAW-YELLOW    Character, Urine CLEAR CLEAR-SL CLOUD   Anion Gap   Result Value Ref Range    Anion Gap 9.0 8.0 - 16.0 meq/L   Osmolality   Result Value Ref Range    Osmolality Calc 278.1 275.0 - 300.0 mOsmol/kg   Glomerular Filtration Rate, Estimated   Result Value Ref Range    Est, Glom Filt Rate 75 (A) ml/min/1.28m2   EKG Abd Pain   Result Value Ref Range    Ventricular Rate 59 BPM    Atrial Rate 59 BPM    P-R Interval 162 ms    QRS Duration 84 ms    Q-T Interval 424 ms    QTc Calculation (Bazett) 419 ms    P Axis 60 degrees    R Axis 25 degrees    T Axis 32 degrees     No results found.  RECENT VITALS:     Temp: 98.3 ??F (36.8 ??C),  Pulse: 76, Resp: 18, BP: 127/64, SpO2: 100 %    MDM/ED Course          The patient presents with abdominal pain.  The patient is feeling better with a benign repeat examination. There is no evidence of an acute abdomen at this time.  Based on history, physical exam, risk factors, and tests,  my suspicion for bowel obstruction, acute pancreatitis, abscess, perforated viscous, diverticulitis, cholecystis, appendicitis is very low and I feel the patient can be managed as an outpatient with follow up.   Instructions have been given for the patient to return to the ED for worsening of the pain, high fevers, intractable vomiting, or bleeding.  Strict follow up and return precautions have been discussed.  Will refer patient to  bariatric surgery      CLINICAL IMPRESSION      1. Abdominal pain, epigastric          DISPOSITION/PLAN   DISPOSITION Decision To Discharge 08/01/2020 08:29:44 AM      PATIENT REFERRED TO:  Donette Larry, MD  [address]  [place] [address]  [phone]    In 3 days      Azucena Fallen, MD  [address]  [address]  [place] [address]  [phone]    In 1 week        DISCHARGE MEDICATIONS:  New Prescriptions    No medications on file              (Please note that  portions of this note were completed with a voice recognition program.  Efforts were made to edit the dictations but occasionally words are mis-transcribed.)      Neva Seat, DO (electronically signed)  Attending Emergency Physician         Neva Seat, DO  08/01/20 0841       Neva Seat, DO  08/01/20 [phone]

## 2020-08-01 NOTE — ED Notes (Signed)
ED nurse-to-nurse bedside report    Chief Complaint   Patient presents with   ??? Abdominal Pain     LUQ      LOC: alert and orientated to name, place, date  Vital signs   Vitals:    08/01/20 0524   BP: 128/83   Pulse: 71   Resp: 16   Temp: 98.4 ??F (36.9 ??C)   TempSrc: Oral   SpO2: 100%   Weight: 145 lb (65.8 kg)   Height: 5\' 4"  (1.626 m)      Pain:  7/10  Pain Interventions: MAR  Pain Goal: 0  Oxygen: No    Current needs required RA   Telemetry: Yes  LDAs:   Peripheral IV 05/30/20 Right Antecubital (Active)       Peripheral IV 08/01/20 Left;Anterior Forearm (Active)   Site Assessment Clean;Dry;Intact 08/01/20 0602   Dressing Status Clean;Dry;Intact 08/01/20 0602     Continuous Infusions:   Mobility: Independent  Morse Fall Risk Score: No flowsheet data found.  Fall Interventions: socks  Report given to:         08/03/20, RN  08/01/20 [phone]

## 2020-08-01 NOTE — Discharge Instructions (Signed)
Return to the Emergency Department immediately if you develop worsening pain, fever, vomiting, dark or bloody stools , or you have any other concerns.  Please follow up with your general surgeon in 2-3 days.

## 2020-08-02 LAB — EKG 12-LEAD
Atrial Rate: 59 {beats}/min
P Axis: 60 degrees
P-R Interval: 162 ms
Q-T Interval: 424 ms
QRS Duration: 84 ms
QTc Calculation (Bazett): 419 ms
R Axis: 25 degrees
T Axis: 32 degrees
Ventricular Rate: 59 {beats}/min

## 2020-08-07 ENCOUNTER — Inpatient Hospital Stay
Admit: 2020-08-07 | Payer: PRIVATE HEALTH INSURANCE | Primary: Student in an Organized Health Care Education/Training Program

## 2020-08-07 ENCOUNTER — Encounter

## 2020-08-07 ENCOUNTER — Inpatient Hospital Stay: Admit: 2020-08-07 | Primary: Student in an Organized Health Care Education/Training Program

## 2020-08-07 ENCOUNTER — Telehealth

## 2020-08-07 DIAGNOSIS — Z006 Encounter for examination for normal comparison and control in clinical research program: Secondary | ICD-10-CM

## 2020-08-07 LAB — GLOMERULAR FILTRATION RATE, ESTIMATED: Est, Glom Filt Rate: 75 mL/min/{1.73_m2} — AB

## 2020-08-07 NOTE — Telephone Encounter (Signed)
I had called pt earlier to gather information for her appointment with Dr. Neoma Laming tomorrow. She states that she had went to the ER on 08/01/2020 for "LLQ" abd pain, nausea and "epigastric pain." Patient has a history of bariatric surgery with Dr. Andres Shad in [place]. Pt would better benefit from following up with her bariatric surgeon per Dr. Neoma Laming, since having these abdominal issues. I had called pt back to make her aware of this, and to possibly transfer her down to bariatric clinic to follow up with Dr. Sharla Kidney since he is closer than her previous baritric surgeon. Pt was very argumentative, yelling over the phone stating that she has a hernia and if our office isn't confident enough to help her in this situation than she will be more than happy to go back "3 hours away" to her original surgeon. When pt was asked if she had a hernia earlier in the conversation, she denied this and states that she just had epigastric abd pain and nausea in which she went to the emergency room for on 08/01/2020. Pt states that she doesn't need our help and will not be seeing anyone in our office. Pt appt canceled.

## 2020-08-07 NOTE — Telephone Encounter (Signed)
LRYGB 10/09/13 MP    Last OV-09/03/15 with KW    Pt noted to have called and left a message that she believes she has an "inguinal hernia and is having upper abd pain, n/v and weight loss as well as too many other symptoms to mention on the phone". Pt asking for call back.     Will contact pt tomorrow.

## 2020-08-08 ENCOUNTER — Encounter
Payer: PRIVATE HEALTH INSURANCE | Attending: Surgery | Primary: Student in an Organized Health Care Education/Training Program

## 2020-08-08 NOTE — Telephone Encounter (Signed)
Noted, she will need lab prior to the visit.

## 2020-08-08 NOTE — Telephone Encounter (Signed)
Pt contacted and states all started in 2018 with sharp pain in middle of stomach, intermittent.  States a lot of constipation. States pain with nausea. States pain noted to last 1-2 weeks and will have constipation/diarrhea. States she is always tired. States she is starting her vitamins.  States she has numb hands and believes it is work related. 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 States was working for [employer].  States fatigue has been ongoing for years. States she is aware she is anemic, believed to be ulcer/gastritis and endoscopy and not this.  States having chest pain and was seen by cardiology and cleared. States noted to have bile duct dilated and was to have ERCP in 2018 and never followed up. States first GI Dr told her not to worry about. States no GB and hx of pancreatitis.  States awaiting MRCP via GI dr. Pt states no relief with heat or cold.  Hx of endometriosis and hx of vag hyst but still has ovaries.   GI and cardiology dr added to care team. Pt does not have PCP at this time but was given PCP's in her area.  Pt informed that her symptoms will be reviewed by NP and she will receive a call from the office to schedule with NP.  Pt states understanding. Pt to call with any further questions or concerns.     NP-FYI    FDT-please schedule with NP for pop-abd pain

## 2020-08-08 NOTE — Telephone Encounter (Signed)
FDT-please notify pt of need for labs prior to appt and mail them if needed.

## 2020-08-08 NOTE — Progress Notes (Signed)
This encounter was created in error - please disregard.

## 2020-08-12 NOTE — Telephone Encounter (Signed)
FDT-this was noted to have been marked "done" and there is no appt scheduled. Please address.

## 2020-08-12 NOTE — Telephone Encounter (Signed)
Pt scheduled with LB on 09/06/20 and mailed lab req to pts home.

## 2020-09-06 ENCOUNTER — Inpatient Hospital Stay
Admit: 2020-09-06 | Discharge: 2020-09-07 | Disposition: A | Discharge status: Home / Self Care | Payer: PRIVATE HEALTH INSURANCE | Admitting: Surgery

## 2020-09-06 ENCOUNTER — Ambulatory Visit
Admit: 2020-09-06 | Discharge: 2020-09-06 | Payer: PRIVATE HEALTH INSURANCE | Attending: Nephrology | Primary: Student in an Organized Health Care Education/Training Program

## 2020-09-06 DIAGNOSIS — R1013 Epigastric pain: Principal | ICD-10-CM

## 2020-09-06 DIAGNOSIS — K909 Intestinal malabsorption, unspecified: Secondary | ICD-10-CM

## 2020-09-06 MED ORDER — DIATRIZOATE MEGLUMINE & SODIUM 66-10 % PO SOLN
66-10 % | Freq: Once | ORAL | Status: DC | PRN
Start: 2020-09-06 — End: 2020-09-07
  Administered 2020-09-07: 04:00:00 20 mL via ORAL

## 2020-09-06 MED ORDER — NORMAL SALINE FLUSH 0.9 % IV SOLN
0.9 % | Freq: Three times a day (TID) | INTRAVENOUS | Status: DC
Start: 2020-09-06 — End: 2020-09-07

## 2020-09-06 NOTE — Progress Notes (Signed)
[facility]  BARIATRIC CARE CENTER  PROGRESS NOTE  POST WEIGHT LOSS SURGERY WEIGHT LOSS MANAGEMENT            Patient: [name]     Date of Birth: 25-Nov-1980         Service Date: 09/06/20         This patient is alone for the evaluation today.    Date of Weight Loss Surgery: 10/09/2013    Procedure Performed: Laparoscopic Roux-en-Y Gastric Bypass    Initial Weight: No data recorded    Diet & Exercise Compliance:  Compliance with Recommended Eating Plan:  Eating 75g protein per day  no    If no, reason:  Drinking at least 64 ounces of water per day yes   If no, reason:  Avoiding caffeine, added sugar, and carbonation no   If no, reason:    Compliance with Recommended Exercise Plan:  Exercising:    yes       If no, reason:        If yes:         Type:   [employer] / elliptical / weights           Times per week:    twice         Min per session:    45 minutes/day    Last Visit Weight: 212.2lb     Weight today: 206.2lb  BMI today: 34.84    Today's weight has decreased from the last visit    Reason patient is here today:  C/O abdominal since 2018 byt getting consistently and progressivley worse. Has been seen several times in ED @ [facility] and [facility]. States pain is always there but certain times of month pain is worse. Not associated with food intake, either constipation or diarrhea, at times feels dizzy and sweaty and nymbness in hands and toes, also has generalized malaise.     Pain:  Patient rates pain on scale 0-10 as: 6      Falls Risk Assessment  Patient does not take medications which affect BP or mental status   Patient does not have newly prescribed or changed dosage of medications within past 30 days which affect BP or mental status  Patient has not fallen in the past 2 months  Patient does not demonstrate unsteady gait  Patient uses the following ambulatory assistive devices: none  Patient states the presence of the following traits which increases risk of fall: none    Patient is  low risk for falls.    Patient has had a time when it was difficult for an IV to be placed.    Patient is not on home O2    Patient has not has a time when it was difficult to intubate them      Post-op Weight Metrics:   %EBWL: % EBWL: 54%  Weight Change Since Last Visit: Weight Change: -6 lbs  Weight Change from Highest Pre-op Weight: Total Weight Change: -87.8 lbs       Diabetes   1. Do you currently have diabetes?       []  Yes [x]  No   2. Are you currently prescribed insulin?      []  Yes  [x]  No   []  N/A   3. Are you currently prescribed an oral medication for diabetes?   []  Yes  [x]  No   []  N/A       GERD (Gastroesophageal Reflux Disease)  1. Do you currently have GERD?       [x]  Yes  []  No   2. Do you get heartburn type symptoms more than twice per week? []  Yes  [x]  No   3. Are you currently on a medication for GERD? (not TUMS)  (examples: Prilosec/omeprazole, Zantac/ranitidine, etc.)    [x]  Yes  []  No      Hyperlipidemia (high cholesterol)   1. Do you currently have a diagnosis of high cholesterol?    []  Yes  [x]  No   2. Are you currently prescribed a medication for high cholesterol?          (examples Lipitor/Atorvastatin, Pravastatin, Zetia, Tricor, etc.)   []  Yes  [x]  No   3. Have you been diagnosed with high cholesterol but chosen not         to take medication?       []  Yes  [x]  No      Hypertension (high blood pressure)   1. Do you currently have a diagnosis of Hypertension?    []  Yes  [x]  No   2. Are you currently on a medication for Hypertension?   []  Yes  [x]  No   3. Have you been diagnosed with Hypertension but have chosen   not to take the medication?        []  Yes  [x]  No       Sleep Apnea   1. Do you currently have Sleep Apnea?      []  Yes  [x]  No    2. Are you on a device (CPAP, BiPAP, etc) for Sleep Apnea?   []  Yes  [x]  No    3. Have you been diagnosed with Sleep Apnea but cannot tolerate   or have chosen not to treat?      []  Yes  [x]  No       Completed by: , LPN

## 2020-09-06 NOTE — ED Provider Notes (Signed)
Emergency Department Encounter  [name] EMERGENCY DEPT    Patient: [name]  MRN: [id number]  DOB: 11-Sep-1981  Date of Evaluation: 09/06/2020  ED Supervising Physician: Quita Skye, MD    I independently examined and evaluated [name].    In brief, [name] is a 39 y.o. female that presents to the emergency department for the evaluation of abdominal pain with prior gastric bypass in 2014.  Was referred to come here by nurse with actual concern for internal hernia.  Her symptoms are not new and she has frequent constipation and nausea.  No fevers or chills.    Focused exam:   Height: 5' 4"  (1.626 m), Weight: 92.1 kg (203 lb), BP: (!) 143/88  No abdominal distention or tenderness.    Brief ED course/MDM:   Overall well-appearing.  CT scan was obtained which demonstrates possible intussusception versus small internal hernia.  For this reason surgery was consulted and they will be taking the patient to the OR for exploratory laparoscopy.         ED Course as of 09/08/20 0517   Fri Sep 06, 2020   2226 Hemogram  (CBC) w/Auto Diff(!):    WBC 4.9   RBC 4.10   Hemoglobin Quant 11.2(!)   Hematocrit 36.2   MCV 88.4   MCH 27.4   MCHC 31.0(!)   RDW 15.2(!)   Platelet Count 466(!)   MPV 8.9   Granulocytes % 47.6   Lymphocyte % 39.6   Monocytes 9.8   Eosinophils 1.9   Basophils 1.1   Absolute Neut # 2.3   Absolute Lymph # 1.9   Absolute Mono # 0.5   Absolute Eos # 0.1   Basophils Absolute 0.1  CBC without leukocytosis or thrombocytopenia.  There is a stable anemia 11.2 [RW]   2226 HCG Qualitative, Serum:    hCG Qual NEGATIVE  hCG negative [RW]   2226 Lipase:    Lipase 87  Lipase without evidence of pancreatitis [RW]   2226 Hepatic Function Panel(!):    Albumin 4.6   Total Protein 7.9   Bilirubin 1.2   Bilirubin, Direct 0.0   Alk Phos 68   ALT 16   AST 50(!)  LFTs normal [RW]   4010 Basic Metabolic Panel:    Sodium 136   Potassium 4.9   Chloride 104   CO2 26   Anion Gap 6   Glucose 90   BUN 13   Creatinine 1.05    eGFR African American 77.3   EGFR IF NonAfrican American 66.7   Calcium 9.1  Electrolytes and renal function normal [RW]   Sat Sep 07, 2020   0403 CT Abdomen Pelvis W Contrast  IMPRESSION:   Postsurgical changes of Roux-en-Y gastric bypass. Small bowel  intussusception in the left hemiabdomen at the anastomosis of the  hepatobiliary limb measuring 7.7 cm in length. Contrast is noted  distal to this area of intussusception, which may suggest a transient  appearance. No small bowel obstruction. Recommend follow-up abdominal  radiographs to assess clearance of oral contrast.  [RW]   2725 Discussed with general surgery who will come evaluate. [RW]      ED Course User Index  [RW] Sherryl Barters, APRN - CNP         All diagnostic, treatment, and disposition decisions were made by myself in conjunction with the APP/Resident. I personally supervised any procedures performed if any.   For all further details of the patient's emergency department visit,  please see their documentation.    (Please note that portions of this note may have been completed with a voice recognition program. Efforts were made to edit the dictations but occasionally words are mis-transcribed.)    Quita Skye, MD  [employer]       Quita Skye, MD  09/08/20 [phone]

## 2020-09-06 NOTE — Progress Notes (Signed)
HPI, PHYSICAL EXAMINATION & PLAN  POST-OP     HPI: Patient here today for  Evaluation of upper abdominal pain, states ongoing for the last year.'  Roux en Y December 2014.   88 lb lost and 54% EBWL.    Female patient feeling well. Denies nausea, vomiting, dysphagia, or any GERD Sx. Currently is not on any PPI. Patient states diet and exercise is going fairly well. Currently is  eating 65-75 gm/day protein, and is  compliant with prescribed multivitamins and supplements.    [name] LIma  July of this year.  c scope - polyp    Reported as normal.  CT scan - mesenteric swirling- Sept 2021.  Pain in mid abdomen, two to three times a month, severe abdominal pain and severe bloating.    For one wk after she is constipated,   Vital signs are stable. Labs were Completed. All labs were: on pts my chart site- acceptable.       Physical Examination:   BP 128/80    Pulse 66    Temp 97.6 ??F (36.4 ??C)    Resp 18    Ht 5' 4.5" (1.638 m) Comment: BCC   Wt 206 lb 3.2 oz (93.5 kg)    BMI 34.85 kg/m??     General:   This patient is awake, alert, and oriented, and is in no apparent distress.    Abdomen:   Obese, soft, non-tender, non-distended without masses/ No evidence of abdominal hernia / Incisions consistent with previous surgeries.  Extremities:   No cyanosis, clubbing or edema/ No calf tenderness/No restrictions of movement, is ambulatory without assistance.  Neurological:   Intact x 4 extremities, no focal deficits notes.  Skin:    No rashes or lesions noted.    Rectal:   Deferred    Current Medications:   Patient's Medications   New Prescriptions    No medications on file   Previous Medications    FAMOTIDINE (PEPCID) 40 MG TABLET    Take 40 mg by mouth daily GERD    FERROUS SULFATE (IRON PO)    Take 15 mLs by mouth daily    PROMETHAZINE (PHENERGAN) 12.5 MG TABLET    Take 12.5 mg by mouth every 6 hours as needed for Nausea    SUCRALFATE (CARAFATE) 1 GM TABLET    Take 1 g by mouth 4 times daily    VITAMIN B-1 (THIAMINE) 100  MG TABLET    Take 100 mg by mouth daily    VITAMIN B-12 (CYANOCOBALAMIN) 1000 MCG TABLET    Place 1,000 mcg under the tongue once a week Indications: SUPPLEMENT    VITAMIN D PO    Take by mouth   Modified Medications    No medications on file   Discontinued Medications    No medications on file         Medications ordered during this encounter:   Outpatient Encounter Medications as of 09/06/2020   Medication Sig Dispense Refill   ??? Ferrous Sulfate (IRON PO) Take 15 mLs by mouth daily     ??? VITAMIN D PO Take by mouth     ??? vitamin B-1 (THIAMINE) 100 MG tablet Take 100 mg by mouth daily     ??? famotidine (PEPCID) 40 MG tablet Take 40 mg by mouth daily GERD     ??? promethazine (PHENERGAN) 12.5 MG tablet Take 12.5 mg by mouth every 6 hours as needed for Nausea     ??? vitamin B-12 (CYANOCOBALAMIN)  1000 MCG tablet Place 1,000 mcg under the tongue once a week Indications: SUPPLEMENT     ??? sucralfate (CARAFATE) 1 GM tablet Take 1 g by mouth 4 times daily       No facility-administered encounter medications on file as of 09/06/2020.           No orders of the defined types were placed in this encounter.      Visit Diagnoses:   1. Intestinal malabsorption, unspecified type    2. Deficiency of multiple nutrient elements    3. Other fatigue    4. Difficult intravenous access    5. Gastroesophageal reflux disease without esophagitis    6. Abdominal pain, unspecified abdominal location    7. Class 1 obesity due to excess calories with serious comorbidity and body mass index (BMI) of 34.0 to 34.9 in adult          Plan:    1). Diet and Exercise: doing well, several years post op.  2). Continue to monitor for signs and symptoms of GERD / OFF PPI  3). Labs: pending  4). Psych concerns : No  5). Excessive skin concerns  NO   6)  Abdominal pain- likely internal hernia - will confer w DR Garald Braver about surgical intervention.  Pt was advised to report to ER once she has a ride back to the hospital. She will talk to brother and see about  coming back in this weekend.         Pt  met with the dietitian today to review our vitamin and protein recommendations.  Increase activity as recommended, the wounds are healed, no evidence of abdominal wall hernias.  No nausea vomiting or dysphagia noted.  Appropriate bowel function, discussed with her regarding contacting us for any questions or concerns.  The lab slip was signed for the next visit, labs from  were reviewed and are within normal limits. .    I personally performed the evaluation and management of [name] in the development of a treatment plan for this patient. I personally interviewed the patient and performed an individual physical examination. In addition, I discussed the patient's condition and treatment options with them. I have also reviewed and agree with the past medical, family and social history unless otherwise noted. All of the patient's questions were answered.     I discussed/counseled the patient regarding the postoperative care plan for this patient. The patient was seen and examined independently and relevant data reviewed by myself. A full chart review was performed.    Patient Care Team:  Annamary Rummage, DO (General Surgery)  [facility]  Felizardo Hoffmann, MD as Consulting Physician (Gastroenterology)  Lawerance Cruel, MD as Consulting Physician (Interventional Cardiology)    Electronically signed by Shaune Leeks, APRN - CNP on 09/06/2020 at 2:00 PM

## 2020-09-06 NOTE — ED Provider Notes (Signed)
[facility] EMERGENCY DEPT  EMERGENCY DEPARTMENT ENCOUNTER      Pt Name: [name]  MRN: [id number]  [place] [birth year]  Date of evaluation: 09/06/2020  Provider: Sherryl Barters, APRN - CNP     Patient was seen in conjunction with Dr. Rhodia Albright whom also independently obtained history and  evaluated the patient    Due to concern for COVID-19 in the healthcare setting I wore protective eyewear, N95 respirator and surgical mask for the entirety of the encounter.     CHIEF COMPLAINT       Chief Complaint   Patient presents with   ??? Abdominal Pain     mid upper pain for 4 years with the pain recently increasing pain, concern internal hernia.    ??? Nausea   ??? Constipation         HISTORY OF PRESENT ILLNESS   (Location/Symptom, Timing/Onset,Context/Setting, Quality, Duration, Modifying Factors, Severity) Note limiting factors.   HPI    [name] is a 39 y.o. female with past medical history of anemia, GERD, history of cholecystectomy, C-section, hysterectomy and gastric bypass in 2014 who presents to the emergency department with worsening of her chronic epigastric abdominal pain.  Patient states is the same pain she been having for many months.  It is not new, there are no new symptoms today that prompted her to come here other than referral by her nurse.  She denies any fevers or chills.  She is always nauseous does have some constipation but no vomiting diarrhea.  She does note some urinary urgency and frequency but also has been ongoing for many months.    Nursing Notes were reviewed.    REVIEW OFSYSTEMS    (2+ for level 4; 10+ level 5)   Review of Systems    All other systems reviewed and are negative except as noted in history of present illness    PAST MEDICAL HISTORY     Past Medical History:   Diagnosis Date   ??? Anemia    ??? Chronic back pain    ??? Deficiency of multiple nutrient elements    ??? Deficiency of multiple vitamins    ??? Fatigue    ??? GERD (gastroesophageal reflux disease)    ??? Intestinal malabsorption    ???  Irregular menstrual cycle    ??? Knee pain    ??? SOB (shortness of breath)        SURGICAL HISTORY       Past Surgical History:   Procedure Laterality Date   ??? BREAST LUMPECTOMY Left 11/02/1998    Benign- [place]   ??? CESAREAN SECTION  11/03/2003   ??? CHOLECYSTECTOMY  11/02/2004    [place]   ??? GASTRIC BYPASS SURGERY  11/02/2012    [id number]   ??? HYSTERECTOMY  11/02/2010    [facility]   ??? SALPINGECTOMY  11/02/2006    Ectopic Pregnancy- [place]       Previous Medications    FAMOTIDINE (PEPCID) 40 MG TABLET    Take 40 mg by mouth daily GERD    FERROUS SULFATE (IRON PO)    Take 15 mLs by mouth daily    PROMETHAZINE (PHENERGAN) 12.5 MG TABLET    Take 12.5 mg by mouth every 6 hours as needed for Nausea    VITAMIN B-1 (THIAMINE) 100 MG TABLET    Take 100 mg by mouth daily    VITAMIN B-12 (CYANOCOBALAMIN) 1000 MCG TABLET  Place 1,000 mcg under the tongue once a week Indications: SUPPLEMENT    VITAMIN D PO    Take by mouth       ALLERGIES     Amoxicillin, Pcn [penicillins], and Zoloft [sertraline hcl]    FAMILY HISTORY       Family History   Problem Relation Age of Onset   ??? High Blood Pressure Mother    ??? Heart Disease Mother    ??? Thyroid Disease Mother    ??? Mental Illness Mother    ??? High Blood Pressure Father    ??? Diabetes Father    ??? Mental Illness Father    ??? High Blood Pressure Sister    ??? Thyroid Disease Sister    ??? Mental Illness Sister    ??? Breast Cancer Other         Aunt        SOCIAL HISTORY       Social History     Socioeconomic History   ??? Marital status: Divorced     Spouse name: None   ??? Number of children: None   ??? Years of education: None   ??? Highest education level: None   Occupational History   ??? None   Tobacco Use   ??? Smoking status: Never Smoker   ??? Smokeless tobacco: Never Used   Substance and Sexual Activity   ??? Alcohol use: Yes     Alcohol/week: 0.0 standard drinks     Comment: varies - 1 glass per night of wine    ??? Drug use: No   ??? Sexual activity: None   Other  Topics Concern   ??? None   Social History Narrative   ??? None     Social Determinants of Health     Financial Resource Strain:    ??? Difficulty of Paying Living Expenses: Not on file   Food Insecurity:    ??? Worried About Charity fundraiser in the Last Year: Not on file   ??? Ran Out of Food in the Last Year: Not on file   Transportation Needs:    ??? Lack of Transportation (Medical): Not on file   ??? Lack of Transportation (Non-Medical): Not on file   Physical Activity:    ??? Days of Exercise per Week: Not on file   ??? Minutes of Exercise per Session: Not on file   Stress:    ??? Feeling of Stress : Not on file   Social Connections:    ??? Frequency of Communication with Friends and Family: Not on file   ??? Frequency of Social Gatherings with Friends and Family: Not on file   ??? Attends Religious Services: Not on file   ??? Active Member of Clubs or Organizations: Not on file   ??? Attends Archivist Meetings: Not on file   ??? Marital Status: Not on file   Intimate Partner Violence:    ??? Fear of Current or Ex-Partner: Not on file   ??? Emotionally Abused: Not on file   ??? Physically Abused: Not on file   ??? Sexually Abused: Not on file   Housing Stability:    ??? Unable to Pay for Housing in the Last Year: Not on file   ??? Number of Places Lived in the Last Year: Not on file   ??? Unstable Housing in the Last Year: Not on file       SCREENINGS  PHYSICAL EXAM    (up to 7 for level 4, 8 or more for level 5)     ED Triage Vitals   BP Temp Temp Source Pulse Resp SpO2 Height Weight   09/06/20 1854 09/06/20 1854 09/06/20 1854 09/06/20 1854 09/06/20 1854 09/06/20 1854 09/06/20 1852 09/06/20 1852   (!) 143/88 98.1 ??F (36.7 ??C) Temporal 64 18 100 % 5' 4"  (1.626 m) 203 lb (92.1 kg)       Physical Exam  Vitals and nursing note reviewed.   Constitutional:       General: She is not in acute distress.     Appearance: She is well-developed. She is not toxic-appearing or diaphoretic.   HENT:      Head: Normocephalic and atraumatic.   Eyes:       General:         Right eye: No discharge.         Left eye: No discharge.      Conjunctiva/sclera: Conjunctivae normal.   Cardiovascular:      Rate and Rhythm: Normal rate.      Pulses: Normal pulses.      Heart sounds: Normal heart sounds.   Pulmonary:      Effort: Pulmonary effort is normal. No respiratory distress.      Breath sounds: Normal breath sounds.   Abdominal:      General: Abdomen is flat. Bowel sounds are normal. There is no distension.      Palpations: Abdomen is soft.      Tenderness: There is no abdominal tenderness. There is no right CVA tenderness, left CVA tenderness, guarding or rebound.   Musculoskeletal:         General: No deformity. Normal range of motion.      Cervical back: Normal range of motion and neck supple.   Skin:     General: Skin is warm and dry.   Neurological:      Mental Status: She is alert and oriented to person, place, and time.      GCS: GCS eye subscore is 4. GCS verbal subscore is 5. GCS motor subscore is 6.   Psychiatric:         Behavior: Behavior normal.         Thought Content: Thought content normal.         Judgment: Judgment normal.         DIAGNOSTIC RESULTS     EKG (Per Emergency Physician): All EKG's areinterpreted by the Emergency Department Physician in the absence of a cardiologist. Please see their note for interpretation of EKG    RADIOLOGY (Per Emergency Physician):     Interpretation per the Radiologist below, if available at the time ofthis note:  CT Abdomen Pelvis W Contrast    Result Date: 09/07/2020  Patient Name:                TIMARA, LOMA MRN:                         [id number] Acct#:                       [id number] Computed Tomography ACCESSION                 EXAM DATE/TIME           PROCEDURE                 ORDERING  PROVIDER [phone]             09/07/2020 01:45 EDT      CT Abdomen/Pelvis w/ IV   MATHIS, PA, CONNOR  N Contrast (IV Onl CPT code [id number] [phone] Reason For Exam (CT Abdomen/Pelvis w/ IV Contrast (IV Onl) epigastric pain,  hx bypass surg Report EXAM: CT Abdomen and pelvis INDICATION: epigastric pain, hx bypass surg COMPARISON: 02/14/2014 TECHNIQUE: CT of the abdomen and pelvis was performed with contrast (75 mL of Isovue 370 was injected intravenously). Oral contrast was administered. Coronal and sagittal reformats were obtained. FINDINGS: LOWER CHEST: within normal limits. ABDOMEN: LIVER: within normal limits. BILE DUCTS: normal caliber. GALLBLADDER: Status post cholecystectomy. PANCREAS: within normal limits. SPLEEN: within normal limits. ADRENALS: within normal limits. KIDNEYS: within normal limits. PELVIS: REPRODUCTIVE ORGANS: no pelvic masses. URETERS: within normal limits. BLADDER: within normal limits. BOWEL: There is small bowel intussusception in the jejunum measuring up to 7.7 cm in length, likely at the anastomosis of the hepatobiliary limb. The small and large bowel are otherwise normal in caliber without evidence of obstruction. Contrast is noted distal to this area of intussusception. Appendix is within normal limits. No enlarged mesenteric lymph nodes. PERITONEUM: no ascites or free air, no fluid collection. VESSELS: Within normal limits. LYMPH NODES: No enlarged nodes. RETROPERITONEUM: within normal limits. ABDOMINAL WALL: within normal limits. BONES: Multilevel degenerative changes of the imaged spine.                                           Computed Tomography Report IMPRESSION: Postsurgical changes of Roux-en-Y gastric bypass. Small bowel intussusception in the left hemiabdomen at the anastomosis of the hepatobiliary limb measuring 7.7 cm in length. Contrast is noted distal to this area of intussusception, which may suggest a transient appearance. No small bowel obstruction. Recommend follow-up abdominal radiographs to assess clearance of oral contrast. Report Dictated on Workstation: [id number] ---** Final ---** Dictated: 09/07/2020 2:16 am Dictating Physician: Linward Headland, MD, [facility] Signed Date and Time: 09/07/2020  2:23 am Signed by: Linward Headland, MD, [facility] Transcribed Date and Time: 09/07/2020 2:16      ED BEDSIDE ULTRASOUND:   Performed by ED Physician - none    LABS:  Labs Reviewed   CBC WITH AUTO DIFFERENTIAL - Abnormal; Notable for the following components:       Result Value    Hemoglobin 11.2 (*)     MCHC 31.0 (*)     RDW 15.2 (*)     Platelets 466 (*)     All other components within normal limits    Narrative:     Test Performed by [facility], [address] [place], [place]  [address]   HEPATIC FUNCTION PANEL - Abnormal; Notable for the following components:    AST 50 (*)     All other components within normal limits    Narrative:     Test Performed by [facility], [address], [place]  [address]   BASIC METABOLIC PANEL    Narrative:     Test Performed by [facility], [place] [address] [place]  [address]   LIPASE    Narrative:     Test Performed by [place], [place] [address] [place]  [address]   URINALYSIS    Narrative:     Test Performed by [facility], [place]  [address], [place], [place]  [address]   HCG, SERUM, QUALITATIVE    Narrative:     Test Performed by [place], [place] [address], [place], [place]  [address]   BASIC METABOLIC PANEL W/ REFLEX TO MG FOR LOW K   LACTIC ACID, PLASMA        All other labs were within normal range or not returned as of this dictation.    EMERGENCY DEPARTMENT COURSE and DIFFERENTIAL DIAGNOSIS/MDM:   Vitals:    Vitals:    09/06/20 1852 09/06/20 1854   BP:  (!) 143/88   Pulse:  64   Resp:  18   Temp:  98.1 ??F (36.7 ??C)   TempSrc:  Temporal   SpO2:  100%   Weight: 92.1 kg (203 lb)    Height: 5' 4"  (1.626 m)        Medications   sodium chloride flush 0.9 % injection 3 mL (has no administration in time range)   diatrizoate meglumine-sodium (GASTROGRAFIN) 66-10 % solution 20 mL (20 mLs Oral Given 09/07/20 0024)   sodium chloride flush 0.9 % injection 5-40 mL (has no administration in time range)   sodium chloride flush 0.9 % injection 5-40 mL (has no administration in  time range)   0.9 % sodium chloride infusion (has no administration in time range)   ondansetron (ZOFRAN-ODT) disintegrating tablet 4 mg (has no administration in time range)     Or   ondansetron (ZOFRAN) injection 4 mg (has no administration in time range)   enoxaparin (LOVENOX) injection 40 mg (has no administration in time range)   lactated ringers infusion (has no administration in time range)   acetaminophen (OFIRMEV) infusion 1,000 mg (has no administration in time range)   HYDROmorphone (DILAUDID) injection 0.25 mg (has no administration in time range)     Or   HYDROmorphone (DILAUDID) injection 0.5 mg (has no administration in time range)   morphine sulfate (PF) injection 4 mg (has no administration in time range)   ondansetron (ZOFRAN) injection 4 mg (has no administration in time range)       MDM.  Patient is a 39 year old female presented to the ED with chronic abdominal pain.  She is all benign exam.  She is hemodynamically stable.  She was initially seen with a clinician in triage and work-up was initial including labs, urine and CT imaging.    Lab studies overall nondiagnostic.  CT imaging showing findings of possible intussusception at 7.7 cm.  Discussed with general surgery team.  They plan to hospitalize to their service and take to the OR later today.    ED Course as of 09/07/20 0518   Fri Sep 06, 2020   2226 Hemogram  (CBC) w/Auto Diff(!):    WBC 4.9   RBC 4.10   Hemoglobin Quant 11.2(!)   Hematocrit 36.2   MCV 88.4   MCH 27.4   MCHC 31.0(!)   RDW 15.2(!)   Platelet Count 466(!)   MPV 8.9   Granulocytes % 47.6   Lymphocyte % 39.6   Monocytes 9.8   Eosinophils 1.9   Basophils 1.1   Absolute Neut # 2.3   Absolute Lymph # 1.9   Absolute Mono # 0.5   Absolute Eos # 0.1   Basophils Absolute 0.1  CBC without leukocytosis or thrombocytopenia.  There is a stable anemia 11.2 [RW]   2226 HCG Qualitative, Serum:    hCG Qual NEGATIVE  hCG negative [RW]   2226 Lipase:  Lipase 87  Lipase without evidence of  pancreatitis [RW]   2226 Hepatic Function Panel(!):    Albumin 4.6   Total Protein 7.9   Bilirubin 1.2   Bilirubin, Direct 0.0   Alk Phos 68   ALT 16   AST 50(!)  LFTs normal [RW]   7673 Basic Metabolic Panel:    Sodium 136   Potassium 4.9   Chloride 104   CO2 26   Anion Gap 6   Glucose 90   BUN 13   Creatinine 1.05   eGFR African American 77.3   EGFR IF NonAfrican American 66.7   Calcium 9.1  Electrolytes and renal function normal [RW]   Sat Sep 07, 2020   0403 CT Abdomen Pelvis W Contrast  IMPRESSION:   Postsurgical changes of Roux-en-Y gastric bypass. Small bowel  intussusception in the left hemiabdomen at the anastomosis of the  hepatobiliary limb measuring 7.7 cm in length. Contrast is noted  distal to this area of intussusception, which may suggest a transient  appearance. No small bowel obstruction. Recommend follow-up abdominal  radiographs to assess clearance of oral contrast.  [RW]   4193 Discussed with general surgery who will come evaluate. [RW]      ED Course User Index  [RW] Sherryl Barters, APRN - CNP       REVAL:         CRITICAL CARE TIME   Total Critical Care time was  minutes, excluding separately reportable procedures.  There was a high probability of clinically significant/life threatening deteriorationin the patient's condition which required my urgent intervention.     CONSULTS:  IP CONSULT TO GENERAL SURGERY    :  Unless otherwise noted below, none     Procedures    FINAL IMPRESSION      1. Abdominal pain, epigastric          DISPOSITION/PLAN   DISPOSITION Admitted 09/07/2020 05:15:30 AM      PATIENT REFERRED TO:  No follow-up provider specified.    DISCHARGE MEDICATIONS:  New Prescriptions    No medications on file          (Please note:  Portions of this note were completed with a voice recognition program.  Efforts were made to edit the dictations but occasionally words and phrases are mis-transcribed.)  Form [id number].[id number]    Sherryl Barters, APRN - CNP  [employer] [facility]        Sherryl Barters, APRN - CNP  09/07/20 [phone]

## 2020-09-06 NOTE — ED Triage Notes (Cosign Needed)
Emergency Department Encounter  [facility] EMERGENCY DEPT    Patient: [name]  MRN: [id number]  DOB: [birth year]  Date of Evaluation: 09/06/2020  ED Provider: Soundra Pilon, PA-C    As the provider-in-triage, I performed a medical screening history and physical exam on this patient.    HISTORY OF PRESENT ILLNESS  In brief, [name] is a 39 y.o. female that presents for epigastric pain, nausea, decreased bowel movements.  She has history of gastric bypass surgery in 2014 by Dr. Andres Shad.  States she has had bowel problems ever since.  She is worried about an internal hernia.  She is able to pass flatus.  Denies dark or bloody stools or hematemesis, acute complaints otherwise.     PHYSICAL EXAM  ED Triage Vitals   Enc Vitals Group      BP 09/06/20 1854 (!) 143/88      Pulse 09/06/20 1854 64      Resp 09/06/20 1854 18      Temp 09/06/20 1854 98.1 ??F (36.7 ??C)      Temp Source 09/06/20 1854 Temporal      SpO2 09/06/20 1854 100 %      Weight 09/06/20 1852 203 lb (92.1 kg)      Height 09/06/20 1852 5\' 4"  (1.626 m)      Head Circumference --       Peak Flow --       Pain Score --       Pain Loc --       Pain Edu? --       Excl. in GC? --        On brief exam, nondistressed younger female, calm and cooperative.  Cardiac auscultation reveals regular rate and rhythm no obvious murmur, lungs good auscultation bilaterally.  Minimal tenderness in the epigastrium, abdomen flat soft.    We will initiate diagnostics/treatments as indicated and place in main ED as soon as available.    , PA-C  [facility]

## 2020-09-06 NOTE — Other (Unsigned)
Patient Acct Nbr: [id number]   Primary AUTH/CERT:   Primary Insurance Company Name: [employer] Plan name: [facility] HMO PPO  Primary Insurance Group Number: [id number]  Primary Insurance Plan Type: Health  Primary Insurance Policy Number: [id number]    Secondary AUTH/CERT:   Secondary Insurance Company Name: [employer] Plan name: [facility] Mdcaid  Secondary Insurance Group Number: [id number]  Secondary Insurance Plan Type: Health  Secondary Insurance Policy Number: [id number]

## 2020-09-07 ENCOUNTER — Encounter: Attending: Surgery | Primary: Student in an Organized Health Care Education/Training Program

## 2020-09-07 LAB — HEPATIC FUNCTION PANEL
ALT: 16 U/L (ref 0–34)
AST: 50 U/L — ABNORMAL HIGH (ref 15–46)
Albumin,Serum: 4.6 g/dL (ref 3.5–5.0)
Alkaline Phosphatase: 68 U/L (ref 38–126)
Bilirubin, Direct: 0 mg/dL (ref 0.0–0.3)
Total Bilirubin: 1.2 mg/dL (ref 0.2–1.3)
Total Protein: 7.9 g/dL (ref 6.3–8.2)

## 2020-09-07 LAB — BASIC METABOLIC PANEL
Anion Gap: 6 mmol/L (ref 3–13)
BUN: 13 mg/dL (ref 9–20)
CO2: 26 mmol/L (ref 22–30)
Calcium: 9.1 mg/dL (ref 8.4–10.4)
Chloride: 104 mmol/L (ref 98–107)
Creatinine: 1.05 mg/dL (ref 0.52–1.25)
EGFR IF NonAfrican American: 66.7 mL/min (ref >60)
Glucose: 90 mg/dL (ref 70–100)
Potassium: 4.9 mmol/L (ref 3.5–5.1)
Sodium: 136 mmol/L (ref 135–145)
eGFR African American: 77.3 mL/min (ref >60)

## 2020-09-07 LAB — URINALYSIS
Bilirubin Urine: NEGATIVE mg/dL
Glucose, Ur: NORMAL mg/dL (ref <70)
Ketones, Urine: NEGATIVE mg/dL
LEUKOCYTES, UA: NEGATIVE Leu/uL
Nitrite, Urine: NEGATIVE NA
Occult Blood,Urine: NEGATIVE mg/dL
Specific Gravity, Urine: 1.011 NA (ref 1.005–1.030)
Total Protein, Urine: NEGATIVE mg/dL
Urobilinogen, Urine: NORMAL mg/dL (ref 0–1)
pH, Urine: 5.5 NA (ref 5.0–8.0)

## 2020-09-07 LAB — CBC WITH AUTO DIFFERENTIAL
Absolute Baso #: 0.1 10*3/uL (ref 0.0–0.2)
Absolute Eos #: 0.1 10*3/uL (ref 0.0–0.5)
Absolute Lymph #: 1.9 10*3/uL (ref 1.0–4.3)
Absolute Mono #: 0.5 10*3/uL (ref 0.0–0.8)
Absolute Neut #: 2.3 10*3/uL (ref 1.8–7.0)
Basophils: 1.1 % (ref 0.0–2.0)
Eosinophils: 1.9 % (ref 1.0–6.0)
Granulocytes %: 47.6 % (ref 40.0–80.0)
Hematocrit: 36.2 % (ref 35.0–47.0)
Hemoglobin: 11.2 g/dL — ABNORMAL LOW (ref 11.7–16.0)
Lymphocyte %: 39.6 % (ref 20.0–40.0)
MCH: 27.4 pg (ref 26.0–34.0)
MCHC: 31 % — ABNORMAL LOW (ref 32.0–36.0)
MCV: 88.4 fL (ref 79.0–98.0)
MPV: 8.9 fL (ref 7.4–10.4)
Monocytes: 9.8 % (ref 2.0–10.0)
Platelets: 466 10*3/uL — ABNORMAL HIGH (ref 140–440)
RBC: 4.1 10*6/uL (ref 3.80–5.20)
RDW: 15.2 % — ABNORMAL HIGH (ref 11.5–14.5)
WBC: 4.9 10*3/uL (ref 3.6–10.7)

## 2020-09-07 LAB — HCG, SERUM, QUALITATIVE: hCG Qual: NEGATIVE NA

## 2020-09-07 LAB — LIPASE: Lipase: 87 U/L (ref 23–300)

## 2020-09-07 MED ORDER — ROCURONIUM BROMIDE 50 MG/5ML IV SOLN
50 | INTRAVENOUS | Status: AC
Start: 2020-09-07 — End: 2020-09-07

## 2020-09-07 MED ORDER — SUCCINYLCHOLINE CHLORIDE 200 MG/10ML IV SOSY
200 | INTRAVENOUS | Status: AC
Start: 2020-09-07 — End: 2020-09-07

## 2020-09-07 MED ORDER — ONDANSETRON 4 MG PO TBDP
4 MG | ORAL_TABLET | Freq: Three times a day (TID) | ORAL | 0 refills | Status: DC | PRN
Start: 2020-09-07 — End: 2020-12-17

## 2020-09-07 MED ORDER — NORMAL SALINE FLUSH 0.9 % IV SOLN
0.9 % | Freq: Two times a day (BID) | INTRAVENOUS | Status: DC
Start: 2020-09-07 — End: 2020-09-07

## 2020-09-07 MED ORDER — OXYCODONE-ACETAMINOPHEN 5-325 MG PO TABS
5-325 MG | ORAL_TABLET | Freq: Four times a day (QID) | ORAL | 0 refills | Status: DC | PRN
Start: 2020-09-07 — End: 2020-09-07

## 2020-09-07 MED ORDER — SODIUM CHLORIDE 0.9 % IV BOLUS
0.9 % | Freq: Once | INTRAVENOUS | Status: DC | PRN
Start: 2020-09-07 — End: 2020-09-07

## 2020-09-07 MED ORDER — PROMETHAZINE HCL 25 MG/ML IJ SOLN
25 MG/ML | Freq: Once | INTRAMUSCULAR | Status: DC | PRN
Start: 2020-09-07 — End: 2020-09-07

## 2020-09-07 MED ORDER — ACETAMINOPHEN 10 MG/ML IV SOLN
10 MG/ML | Freq: Three times a day (TID) | INTRAVENOUS | Status: DC
Start: 2020-09-07 — End: 2020-09-07
  Administered 2020-09-07: 10:00:00 1000 mg via INTRAVENOUS

## 2020-09-07 MED ORDER — NORMAL SALINE FLUSH 0.9 % IV SOLN
0.9 | INTRAVENOUS | Status: DC | PRN
Start: 2020-09-07 — End: 2020-09-07

## 2020-09-07 MED ORDER — DIPHENHYDRAMINE HCL 50 MG/ML IJ SOLN
50 MG/ML | Freq: Once | INTRAMUSCULAR | Status: DC | PRN
Start: 2020-09-07 — End: 2020-09-07

## 2020-09-07 MED ORDER — OXYCODONE-ACETAMINOPHEN 5-325 MG PO TABS
5-325 MG | ORAL_TABLET | Freq: Four times a day (QID) | ORAL | 0 refills | Status: AC | PRN
Start: 2020-09-07 — End: 2020-09-14

## 2020-09-07 MED ORDER — OXYCODONE HCL 5 MG PO TABS
5 MG | ORAL | Status: AC | PRN
Start: 2020-09-07 — End: 2020-09-07

## 2020-09-07 MED ORDER — ONDANSETRON HCL 4 MG/2ML IJ SOLN
4 MG/2ML | Freq: Four times a day (QID) | INTRAMUSCULAR | Status: DC | PRN
Start: 2020-09-07 — End: 2020-09-07

## 2020-09-07 MED ORDER — HYDROMORPHONE HCL 1 MG/ML IJ SOLN
1 MG/ML | INTRAMUSCULAR | Status: DC | PRN
Start: 2020-09-07 — End: 2020-09-07

## 2020-09-07 MED ORDER — FENTANYL CITRATE (PF) 100 MCG/2ML IJ SOLN
100 MCG/2ML | INTRAMUSCULAR | Status: DC | PRN
Start: 2020-09-07 — End: 2020-09-07
  Administered 2020-09-07: 13:00:00 50 ug via INTRAVENOUS

## 2020-09-07 MED ORDER — LIDOCAINE HCL (PF) 2 % IJ SOLN
2 | INTRAMUSCULAR | Status: AC
Start: 2020-09-07 — End: 2020-09-07

## 2020-09-07 MED ORDER — DEXAMETHASONE SOD PHOSPHATE PF 10 MG/ML IJ SOLN
10 | INTRAMUSCULAR | Status: AC
Start: 2020-09-07 — End: 2020-09-07

## 2020-09-07 MED ORDER — SUGAMMADEX SODIUM 200 MG/2ML IV SOLN
200 | INTRAVENOUS | Status: AC
Start: 2020-09-07 — End: 2020-09-07

## 2020-09-07 MED ORDER — MEPERIDINE HCL 25 MG/ML IJ SOLN
25 MG/ML | INTRAMUSCULAR | Status: DC | PRN
Start: 2020-09-07 — End: 2020-09-07

## 2020-09-07 MED ORDER — ONDANSETRON 4 MG PO TBDP
4 MG | Freq: Three times a day (TID) | ORAL | Status: DC | PRN
Start: 2020-09-07 — End: 2020-09-07

## 2020-09-07 MED ORDER — CLINDAMYCIN PHOSPHATE 600 MG/4ML IJ SOLN
600 | INTRAMUSCULAR | Status: AC
Start: 2020-09-07 — End: 2020-09-07

## 2020-09-07 MED ORDER — FENTANYL CITRATE (PF) 100 MCG/2ML IJ SOLN
100 MCG/2ML | INTRAMUSCULAR | Status: DC | PRN
Start: 2020-09-07 — End: 2020-09-07

## 2020-09-07 MED ORDER — ENOXAPARIN SODIUM 40 MG/0.4ML SC SOLN
40 | Freq: Every day | SUBCUTANEOUS | Status: DC
Start: 2020-09-07 — End: 2020-09-07

## 2020-09-07 MED ORDER — LACTATED RINGERS IV SOLN
INTRAVENOUS | Status: DC
Start: 2020-09-07 — End: 2020-09-07
  Administered 2020-09-07: 10:00:00 via INTRAVENOUS

## 2020-09-07 MED ORDER — LABETALOL HCL 5 MG/ML IV SOLN
5 MG/ML | INTRAVENOUS | Status: DC | PRN
Start: 2020-09-07 — End: 2020-09-07

## 2020-09-07 MED ORDER — ESMOLOL HCL 100 MG/10ML IV SOLN
100 | INTRAVENOUS | Status: AC
Start: 2020-09-07 — End: 2020-09-07

## 2020-09-07 MED ORDER — HYDRALAZINE HCL 20 MG/ML IJ SOLN
20 MG/ML | INTRAMUSCULAR | Status: DC | PRN
Start: 2020-09-07 — End: 2020-09-07

## 2020-09-07 MED ORDER — FENTANYL CITRATE (PF) 100 MCG/2ML IJ SOLN
100 MCG/2ML | INTRAMUSCULAR | Status: DC
Start: 2020-09-07 — End: 2020-09-07

## 2020-09-07 MED ORDER — SODIUM CHLORIDE 0.9 % IV SOLN
0.9 % | INTRAVENOUS | Status: DC | PRN
Start: 2020-09-07 — End: 2020-09-07

## 2020-09-07 MED ORDER — ONDANSETRON 4 MG PO TBDP
4 MG | ORAL_TABLET | Freq: Three times a day (TID) | ORAL | 0 refills | Status: DC | PRN
Start: 2020-09-07 — End: 2020-09-07

## 2020-09-07 MED ORDER — ONDANSETRON HCL 4 MG/2ML IJ SOLN
4 MG/2ML | Freq: Once | INTRAMUSCULAR | Status: AC
Start: 2020-09-07 — End: 2020-09-07
  Administered 2020-09-07: 10:00:00 4 mg via INTRAVENOUS

## 2020-09-07 MED ORDER — ONDANSETRON HCL 4 MG/2ML IJ SOLN
4 | INTRAMUSCULAR | Status: AC
Start: 2020-09-07 — End: 2020-09-07

## 2020-09-07 MED ORDER — PROPOFOL 200 MG/20ML IV EMUL
200 | INTRAVENOUS | Status: AC
Start: 2020-09-07 — End: 2020-09-07

## 2020-09-07 MED ORDER — OXYCODONE HCL 5 MG PO TABS
5 MG | ORAL | Status: AC | PRN
Start: 2020-09-07 — End: 2020-09-07
  Administered 2020-09-07: 15:00:00 5 mg via ORAL

## 2020-09-07 MED ORDER — ONDANSETRON HCL 4 MG/2ML IJ SOLN
4 MG/2ML | Freq: Once | INTRAMUSCULAR | Status: DC | PRN
Start: 2020-09-07 — End: 2020-09-07

## 2020-09-07 MED ORDER — KETAMINE HCL 50 MG/5ML IJ SOSY
50 | INTRAMUSCULAR | Status: AC
Start: 2020-09-07 — End: 2020-09-07

## 2020-09-07 MED ORDER — DEXMEDETOMIDINE HCL 200 MCG/2ML IV SOLN
200 | INTRAVENOUS | Status: AC
Start: 2020-09-07 — End: 2020-09-07

## 2020-09-07 MED ORDER — MORPHINE SULFATE (PF) 4 MG/ML IJ SOLN
4 MG/ML | Freq: Once | INTRAMUSCULAR | Status: AC
Start: 2020-09-07 — End: 2020-09-07
  Administered 2020-09-07: 10:00:00 4 mg via INTRAVENOUS

## 2020-09-07 MED FILL — BRIDION 200 MG/2ML IV SOLN: 200 MG/2ML | INTRAVENOUS | Qty: 2

## 2020-09-07 MED FILL — MORPHINE SULFATE 4 MG/ML IJ SOLN: 4 mg/mL | INTRAMUSCULAR | Qty: 1

## 2020-09-07 MED FILL — FENTANYL CITRATE (PF) 100 MCG/2ML IJ SOLN: 100 MCG/2ML | INTRAMUSCULAR | Qty: 2

## 2020-09-07 MED FILL — OFIRMEV 10 MG/ML IV SOLN: 10 mg/mL | INTRAVENOUS | Qty: 100

## 2020-09-07 MED FILL — KETAMINE HCL 50 MG/5ML IJ SOSY: 50 MG/5ML | INTRAMUSCULAR | Qty: 5

## 2020-09-07 MED FILL — DIPRIVAN 200 MG/20ML IV EMUL: 200 MG/20ML | INTRAVENOUS | Qty: 20

## 2020-09-07 MED FILL — CLINDAMYCIN PHOSPHATE 600 MG/4ML IJ SOLN: 600 MG/4ML | INTRAMUSCULAR | Qty: 4

## 2020-09-07 MED FILL — ONDANSETRON HCL 4 MG/2ML IJ SOLN: 4 MG/2ML | INTRAMUSCULAR | Qty: 2

## 2020-09-07 MED FILL — GASTROGRAFIN 66-10 % PO SOLN: 66-10 % | ORAL | Qty: 30

## 2020-09-07 MED FILL — XYLOCAINE-MPF 2 % IJ SOLN: 2 % | INTRAMUSCULAR | Qty: 5

## 2020-09-07 MED FILL — ROCURONIUM BROMIDE 50 MG/5ML IV SOLN: 50 MG/5ML | INTRAVENOUS | Qty: 5

## 2020-09-07 MED FILL — ESMOLOL HCL 100 MG/10ML IV SOLN: 100 MG/10ML | INTRAVENOUS | Qty: 10

## 2020-09-07 MED FILL — DEXMEDETOMIDINE HCL 200 MCG/2ML IV SOLN: 200 MCG/2ML | INTRAVENOUS | Qty: 2

## 2020-09-07 MED FILL — OXYCODONE HCL 5 MG PO TABS: 5 mg | ORAL | Qty: 1

## 2020-09-07 MED FILL — DEXAMETHASONE SOD PHOSPHATE PF 10 MG/ML IJ SOLN: 10 mg/mL | INTRAMUSCULAR | Qty: 1

## 2020-09-07 MED FILL — SUCCINYLCHOLINE CHLORIDE 200 MG/10ML IV SOSY: 200 MG/10ML | INTRAVENOUS | Qty: 10

## 2020-09-07 NOTE — Progress Notes (Signed)
VO per Dr Danne Baxter pt ok to be discharged.  Tolerating po liquids and crackers.

## 2020-09-07 NOTE — Discharge Summary (Addendum)
Patient ID: [name]  MRN: [id number]          Patient's PCP: No primary care provider on file.    Admit Date: 09/06/2020   Discharge Date:  09/07/2020      Admitting Physician: Carolin Guernsey, DO  Discharge Physician: Epimenio Foot, MD     Active Discharge Diagnoses:  Primary Problem  Partial small bowel obstruction [facility])  Hospital Problems  Active Hospital Problems    Diagnosis Date Noted   ??? Partial small bowel obstruction (HCC) [K56.600] 09/07/2020   ??? SBO (small bowel obstruction) (HCC) [K56.609] 09/07/2020   ??? Lower abdominal pain [R10.30]    ??? Chronic abdominal pain [R10.9, G89.29]    ??? Nausea [R11.0]    ??? Abnormal CT of the abdomen [R93.5]    ??? History of gastric bypass [Z98.84]        The patient was seen and examined on day of discharge and this discharge summary is in conjunction with any daily progress note from day of discharge.    Code Status:  Prior    Hospital Course:  Patient was admitted to [facility] with the chief complaint of abdominal pain, postprandial. Patient was taken to the operating room and under went a 08/07/20 for diagnostic laparoscopy and EGD. Patient progressed throughout hospitalization. On discharge, patient had stable vital signs, tolerating diet, ambulating, with pain controlled on PO pain medication. Patient has follow up appointments as specified below.        Consult(s):  None    Procedure(s):  None    Disposition: Home    Discharged Condition: Stable    Follow Up: Clarene Essex, DO  [address]  [address]  [place] [address]  [phone]    In 2 weeks        Diet: Bariatric diet    Discharge Medications:      Medication List      START taking these medications    ondansetron 4 MG disintegrating tablet  Commonly known as: ZOFRAN-ODT  Take 1 tablet by mouth 3 times daily as needed for Nausea or Vomiting     oxyCODONE-acetaminophen 5-325 MG per tablet  Commonly known as: Percocet  Take 1 tablet by mouth every 6 hours as needed for Pain for up to 7 days. Intended supply:  7 days. Take lowest dose possible to manage pain        CONTINUE taking these medications    famotidine 40 MG tablet  Commonly known as: PEPCID     IRON PO     promethazine 12.5 MG tablet  Commonly known as: PHENERGAN     vitamin B-1 100 MG tablet  Commonly known as: THIAMINE     vitamin B-12 1000 MCG tablet  Commonly known as: CYANOCOBALAMIN     VITAMIN D PO           Where to Get Your Medications      You can get these medications from any pharmacy    Bring a paper prescription for each of these medications  ?? ondansetron 4 MG disintegrating tablet  ?? oxyCODONE-acetaminophen 5-325 MG per tablet         Electronically signed by Epimenio Foot, MD on 09/12/20 at 11:26 AM EST    Thank you Dr. No primary care provider on file. for the opportunity to be involved in this patient's care.    Addendum:  Patient had an intussusception/SBO ruled out.    Electronically signed by Kipp Laurence  Montrey Buist, DO on 09/18/20 at 5:15 PM EST  Pager:[phone]

## 2020-09-07 NOTE — ED Notes (Signed)
Report received from Bethann Goo RN for lunch coverage.     [name]. Tracey Lyons, RN  09/07/20 [phone]

## 2020-09-07 NOTE — Op Note (Signed)
Kipp Laurence Arvin Abello DO   ADVANCED LAPAROSCOPY, BARIATRIC AND ROBOTIC SURGERY  [facility]      OPERATIVE REPORT    PATIENT: [name]      DATE OF BIRTH: [birth year]     ----------------------------------------------------------------------------------------------------------------------    PROCEDURE:    1. DIAGNOSTIC LAPAROSCOPY  2. DIAGNOSTIC ESOPHAGOGASTRODUODENOSCOPY WITH BIOPSY    SURGEON: Whitney Post T. Ramal Eckhardt    ASSISTANT:  Dewayne Shorter, AJ Snood    PRE-OPERATIVE DIAGNOSES:      1. Acute on Chronic Abdominal pain  2. History of LRYGB  3. Abnormal CT Abdomen and Pelvis    POST-OPERATIVE DIAGNOSES:       Same, large JJ anastomosis however no evidence of acute intussusception.  Both mesenteric defects were closed.    ANESTHESIA:  General endotracheal        Transverse abdominis plane block    FLUIDS:  Crystalloid    ESTIMATED BLOOD LOSS:  Minimal    URINE OUTPUT:  Not recorded    PREOPERATIVE MEDICATIONS:  Pre-operative IV antibiotics: 2 gm cefazolin    INDICATIONS FOR PROCEDURE:  The patient is a 39 y.o. female with a history of LRYGB and left upper quadrant abdominal pain.  Patient states that this pain has been persistent for over 3 years and occurs intermittently.  She admits to some nausea but denies vomiting.  She admits to regular bowel movements.  The patient has had a work up concerning for internal hernia versus intussusception and laparoscopic evaluation is warranted. The risks, benefits and options of the procedure and additional possible interventions were reviewed and all questions were answered to the patient's satisfaction.    DESCRIPTION OF PROCEDURE:  The patient was transported to the operating room and identified by name and number. The patient was placed on the operating room table in the supine position and general endotracheal anesthesia was administered by members of the anesthesia team. Following placement of sequential compression devices, the patients extremities  were positioned and protected. The abdomen was prepped and draped in standard surgical fashion. An operating room team "time out" was performed confirming the identity of the patient and the planned procedure.    Access to the abdominal cavity was obtained in the left upper quadrant at Palmer's point using a 5-mm optical trocar.  Pneumoperitoneum was established and a brief exploration of the abdominal cavity was performed. Findings included normal caliber small bowel.  Additional 5-mm trocars were carefully placed under direct visualization, 1 at the umbilicus, 1 in the epigastric region and 1 on the patient's right side.  Visualization revealed a small adhesion to the anterior abdominal wall in the right upper quadrant which was lysed sharply.    We proceeded with evaluation of the pouch and gastrojejunostomy which appeared normal. The Roux limb was run to the jejuno-jejunostomy which was intact.  We then evaluated the biliopancreatic limb to the ligament of Treitz.  All of this appeared normal.  The jejunojejunostomy was then carefully evaluated and appeared large, however it appeared normal.  It was pink and viable with no evidence of active or recent intussusception.  Laparoscopically it appeared widely patent.  The ileocecal junction was then identified and the common channel was carefully run from the terminal ileum all the way back to the jejunojejunostomy.   The mesentery at the jejunojejunostomy was carefully evaluated and a mesenteric defect was not identified.  The area between the colon and the Roux limb mesentery was also carefully evaluated and a defect was not  identified at Petersen's space.  All findings appeared to be consistent with the postoperative laparoscopic Roux-en-Y gastric bypass.  No acute abnormalities were noted.    The endoscope was easily inserted into the oropharynx and guided under direct vision into the esophagus, gastric pouch and into the Roux limb.  The scope was passed  approximately 30 cm into the Roux limb and slowly withdrawn.  The Roux limb appeared normal.  The candycane region of the Roux limb was then carefully inspected and appeared unremarkable.  The gastrojejunostomy was then inspected.  There was no evidence of inflammation, stricture or marginal ulcer.  The scope was then withdrawn into the pouch which appeared unremarkable. Biopsies were obtained for Helicobacter pylori. The scope was withdrawn to the esophagus. The Z-line and gastroesophageal junction were identified without evidence of Barrett???s esophagus, a hiatal hernia or esophagitis. The patient tolerated the procedure very well. The patient was then transferred to the recovery area in good condition. There were no apparent complications.    At the end of the operation, the trocars were carefully removed under direct visualization and the pneumoperitoneum was evacuated.  Fascial defect were closed using 0-Vycril suture for 10-mm trocar sites and the skin incisions were closed using 4-0 Vycril suture in subcuticular fashion. This was followed by steri strips and sterile dressings.     The needle, sponge and instrument counts were correct. The patient tolerated the procedure and the anesthesia well without any major complications. The patients was transported to the post-anesthesia care unit in stable condition. I was present for the entire duration of the procedure.    Electronically signed by Carolin Guernsey, DO on 09/07/2020 at 8:07 AM

## 2020-09-07 NOTE — Progress Notes (Signed)
Pt discharged via wheelchair with rn.  NAD, resp non labored, alert and oriented

## 2020-09-07 NOTE — Brief Op Note (Signed)
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 pylori    Findings: Redundant jejunaljejunal anastomosis with intussception, chronically dilated PBL, peterson and JJ mesenteric defect fused, small gastric pouch without signs of ulcer or gastritis.     Electronically signed by Epimenio Foot, MD on 09/07/2020 at 8:13 AM

## 2020-09-07 NOTE — H&P (Signed)
Department of General Surgery  Surgical Service 4  Resident History and Physical      PATIENT NAME: [name]    DOB:  May 21, 1981    MRN:  [id number]  ATTENDING PHYSICIAN:  Lamonte Sakai, MD  ADMIT DATE:  09/06/2020    TODAY'S DATE:  09/07/2020       CHIEF COMPLAINT:    Chief Complaint   Patient presents with   ??? Abdominal Pain     mid upper pain for 4 years with the pain recently increasing pain, concern internal hernia.    ??? Nausea   ??? Constipation          HISTORY OF PRESENT ILLNESS:    [name] is a 39 y.o. female with PMHx of LRYGB in 2014 with Dr. Andres Shad.  She presents to [facility] with abdominal pain for which surgery was consulted.    At time of consult patient reports cyclical abdominal pain since her index surgery in 2014.  This is usually a left upper quadrant, crampy abdominal pain that happens every few months, and resolves within a number of days.  She reports currently she is in a 3-day episode of left upper quadrant crampy pain, distention, no bowel movement or gas, and epigastric distention.  This is associated with some nausea but no emesis.  She reports some subjective fevers and chills.  Otherwise denies chest pain, shortness of breath, dysuria.    On review of ER work-up???patient afebrile and hemodynamically stable on room air.  CBC, BMP, LFTs within normal limits.  A CT scan was obtained which shows a 7.7cm length of intussusception near her JJ anastomosis.  There is no concern for free air or free fluid, there is no internal hernia evident.    Patient is a non-smoker, reports social EtOH use, no drug use.  She does use NSAIDs about once a month for back pain related to her work.  Surgical history of cholecystectomy, lumpectomy, Roux-en-Y gastric bypass, oophorectomy for ectopic pregnancy, hysterectomy.  She is not on blood thinners or steroids.  She reports extensive family history of cancer in multiple relatives???gastric, colon, lung.     Past Medical History:   Diagnosis Date   ???  Anemia    ??? Chronic back pain    ??? Deficiency of multiple nutrient elements    ??? Deficiency of multiple vitamins    ??? Fatigue    ??? GERD (gastroesophageal reflux disease)    ??? Intestinal malabsorption    ??? Irregular menstrual cycle    ??? Knee pain    ??? SOB (shortness of breath)        Past Surgical History:   Procedure Laterality Date   ??? BREAST LUMPECTOMY Left 11/02/1998    Benign- [place]   ??? CESAREAN SECTION  11/03/2003   ??? CHOLECYSTECTOMY  11/02/2004    [place]   ??? GASTRIC BYPASS SURGERY  11/02/2012    LRYGB-[id number]   ??? HYSTERECTOMY  11/02/2010    [facility]- [facility]   ??? SALPINGECTOMY  11/02/2006    Ectopic Pregnancy- [facility]       Medications Prior to Admission:   Prior to Admission medications    Medication Sig Start Date End Date Taking? Authorizing Provider   Ferrous Sulfate (IRON PO) Take 15 mLs by mouth daily    Historical Provider, MD   VITAMIN D PO Take by mouth    Historical Provider, MD   vitamin B-1 (THIAMINE) 100 MG tablet Take 100 mg by mouth  daily    Historical Provider, MD   famotidine (PEPCID) 40 MG tablet Take 40 mg by mouth daily GERD    Historical Provider, MD   promethazine (PHENERGAN) 12.5 MG tablet Take 12.5 mg by mouth every 6 hours as needed for Nausea    Historical Provider, MD   vitamin B-12 (CYANOCOBALAMIN) 1000 MCG tablet Place 1,000 mcg under the tongue once a week Indications: SUPPLEMENT    Historical Provider, MD       Allergies:  Amoxicillin, Pcn [penicillins], and Zoloft [sertraline hcl]     Social History     Socioeconomic History   ??? Marital status: Divorced     Spouse name: None   ??? Number of children: None   ??? Years of education: None   ??? Highest education level: None   Occupational History   ??? None   Tobacco Use   ??? Smoking status: Never Smoker   ??? Smokeless tobacco: Never Used   Substance and Sexual Activity   ??? Alcohol use: Yes     Alcohol/week: 0.0 standard drinks     Comment: varies - 1 glass per night of wine    ??? Drug use: No   ??? Sexual activity: None   Other Topics  Concern   ??? None   Social History Narrative   ??? None     Social Determinants of Health     Financial Resource Strain:    ??? Difficulty of Paying Living Expenses: Not on file   Food Insecurity:    ??? Worried About Programme researcher, broadcasting/film/video in the Last Year: Not on file   ??? Ran Out of Food in the Last Year: Not on file   Transportation Needs:    ??? Lack of Transportation (Medical): Not on file   ??? Lack of Transportation (Non-Medical): Not on file   Physical Activity:    ??? Days of Exercise per Week: Not on file   ??? Minutes of Exercise per Session: Not on file   Stress:    ??? Feeling of Stress : Not on file   Social Connections:    ??? Frequency of Communication with Friends and Family: Not on file   ??? Frequency of Social Gatherings with Friends and Family: Not on file   ??? Attends Religious Services: Not on file   ??? Active Member of Clubs or Organizations: Not on file   ??? Attends Banker Meetings: Not on file   ??? Marital Status: Not on file   Intimate Partner Violence:    ??? Fear of Current or Ex-Partner: Not on file   ??? Emotionally Abused: Not on file   ??? Physically Abused: Not on file   ??? Sexually Abused: Not on file   Housing Stability:    ??? Unable to Pay for Housing in the Last Year: Not on file   ??? Number of Places Lived in the Last Year: Not on file   ??? Unstable Housing in the Last Year: Not on file        Family History   Problem Relation Age of Onset   ??? High Blood Pressure Mother    ??? Heart Disease Mother    ??? Thyroid Disease Mother    ??? Mental Illness Mother    ??? High Blood Pressure Father    ??? Diabetes Father    ??? Mental Illness Father    ??? High Blood Pressure Sister    ??? Thyroid Disease Sister    ???  Mental Illness Sister    ??? Breast Cancer Other         Aunt       REVIEW OF SYSTEMS:    Review of Systems   Constitutional: Positive for chills and fever.   HENT: Negative for congestion and sinus pressure.    Eyes: Negative for photophobia and visual disturbance.   Respiratory: Negative for chest tightness and  shortness of breath.    Cardiovascular: Negative for chest pain and leg swelling.   Gastrointestinal: Positive for abdominal distention, abdominal pain and constipation. Negative for diarrhea and vomiting.   Endocrine: Negative for polyphagia and polyuria.   Genitourinary: Negative for difficulty urinating and dysuria.   Musculoskeletal: Negative for arthralgias and back pain.   Skin: Negative for rash and wound.   Neurological: Negative for dizziness and light-headedness.   Hematological: Negative for adenopathy. Does not bruise/bleed easily.   Psychiatric/Behavioral: Negative for agitation and confusion.       PHYSICAL EXAM:    Vitals:    09/06/20 1854   BP: (!) 143/88   Pulse: 64   Resp: 18   Temp: 98.1 ??F (36.7 ??C)   SpO2: 100%       No intake/output data recorded.    CONSTITUTIONAL:  awake, alert, cooperative, no apparent distress, and appears stated age  EYES:  Lids and lashes normal, extra ocular muscles intact  HENT:  Normocephalic, without obvious abnormality, atraumatic  NECK:  Supple, symmetrical, trachea midline, no adenopathy, skin normal  LUNGS:  No increased work of breathing, good air exchange, clear to auscultation bilaterally  CARDIOVASCULAR:  regular rate and rhythm, normal S1 and S2  ABDOMEN:   Soft, minimally distended, tender to palpation in the left upper quadrant, no rebound tenderness or guarding.  Well-healed laparoscopic surgical scars.  MUSULOSKELETAL:  There is no redness, warmth, or swelling of the joints.  Full range of motion noted.    NEUROLOGIC:  Awake, alert, oriented to name, place and time.  Marland Kitchen.  SKIN:  normal skin color, texture, turgor    DATA:  CBC:   Lab Results   Component Value Date    WBC 4.9 09/06/2020    WBC 4.6 08/01/2020    RBC 4.10 09/06/2020    HGB 11.2 09/06/2020    HCT 36.2 09/06/2020    MCV 88.4 09/06/2020    MCH 27.4 09/06/2020    MCHC 31.0 09/06/2020    RDW 15.2 09/06/2020    PLT 466 09/06/2020    MPV 8.9 09/06/2020     BMP:    Lab Results   Component Value Date     NA 136 09/06/2020    K 4.9 09/06/2020    CL 104 09/06/2020    CO2 26 09/06/2020    BUN 13 09/06/2020    LABALBU 4.6 09/06/2020    LABALBU 3.8 08/01/2020    CREATININE 1.05 09/06/2020    CALCIUM 9.1 09/06/2020    LABGLOM 75 08/01/2020    GLUCOSE 90 09/06/2020     Hepatic Function Panel:    Lab Results   Component Value Date    ALKPHOS 68 09/06/2020    ALT 16 09/06/2020    AST 50 09/06/2020    PROT 7.9 09/06/2020    BILITOT 1.2 09/06/2020    BILIDIR 0.0 09/06/2020    LABALBU 4.6 09/06/2020    LABALBU 3.8 08/01/2020     PT/INR:  No results found for: PROTIME, INR  Troponin:    Lab Results   Component Value  Date    TROPONINI <0.015 01/23/2016     LIPASE:    Lab Results   Component Value Date    LIPASE 87 09/06/2020       IMAGING: Personally reviewed, findings as above    ASSESSMENT AND PLAN:   39 y.o. female with cyclical abdominal pain after laparoscopic Roux-en-Y gastric bypass, found to have JJ intussusception on imaging    Admit to general surgery  We will schedule for diagnostic laparoscopy pending OR availability  N.p.o., IV fluids  Admit to general surgery    Discussed with on-call MIS and bariatric surgeon Dr. Deborha Payment, MD  PGY2, General Surgery  Pager [phone]    Marlou Starks, MD  09/07/2020 4:46 AM      ATTESTATION      The patient was seen and examined.  I have reviewed the patients presentation, histories, imaging and serology studies. I agree with the above assessment and plan.    The CT scan findings were carefully reviewed. Imaging was concerning for a large jejunojejunostomy with a possible intussusception. Patient's abdominal pain appears to be chronic in nature and has been present on and off for over 3 years. She states that this pain is crampy in nature. Pain is left-sided. Given these findings, I believe a diagnostic laparoscopy is warranted to ensure that the patient does not have an intussusception or an internal hernia given her history of Roux-en-Y gastric bypass. The risks,  benefits and alternatives of a diagnostic laparoscopy, possible bowel resection, possible EGD were discussed with the patient and informed consent was obtained. Await OR availability.  ---------------------------------------------------------------------------------------------------------------------    I Roselle Locus) personally supervised the resident in the evaluation and development of a treatment plan for this patient including using nursing/ems notes.  I personally discussed the review of systems and interviewed the patient along with performing a physical examination. In addition, I discussed the patient's condition and treatment options with them. I have also reviewed and agree with the past medical, family and social history unless otherwise noted and personally reviewed the imaging and labs.  This note may be a delayed entry.  All of the patient's questions were answered.     Greater than 51% of the 50 minute face to face encounter was spent discussing/counseling the patient regarding the care plan for this patient. The patient was seen and examined independently and relevant data reviewed by myself. A full chart review was performed.    1. Abdominal pain, epigastric        Electronically signed by Carolin Guernsey, DO on 09/07/2020 at 8:27 AM

## 2020-09-07 NOTE — Other (Unsigned)
Patient Acct Nbr: [id number]   Primary AUTH/CERT:   Primary Insurance Company Name: [employer] Plan name: [facility]  Primary Insurance Group Number: [id number]  Primary Insurance Plan Type: Health  Primary Insurance Policy Number: [id number]

## 2020-09-07 NOTE — Discharge Instructions (Signed)
DISCHARGE INSTRUCTIONS     Thank you very much for allowing me to participate in your care, it is truly a privilege. Below please see discharge orders that will help you during your recovery. Please do not hesitate to call the office during the day at [phone] for any questions. After hours, the same number will allow you to reach the on-call surgeon.    Leave steri strips in place after surgery.  Any clear bandages and gauze placed in the navel, if applicable, can be removed in 5 days.      If you have been provided with an abdominal binder, this is for your comfort.  Please take this off in order to shower and use it as needed for your comfort.  There is no designated time frame with which you should wear the binder.     Do not lift anything that is 15-20 pounds or greater for 2 weeks from the date of your surgery. This is to prevent herniation at your incision sites.  Activity restrictions will be addressed at the first post op visit.     Please shower the day after surgery.  Soap and water is adequate.  Keep the incisions clean and dry.  No lotions or ointments until you are seen in the office. Do not swim in a pool, go in a hot tub, or soak in a bathtub for the first week after your surgery.    Surgery can hurt! Please make every effort to take the pain medicine as instructed to help reduce your discomfort. I usually recommend that you take pain medicine when you wake up in the morning and before you go to sleep.  Schedule the rest of the day in order to not interfere with medication dosages as prescribed.  If you feel that the medicine is not working, please call the office.     Make sure to take a stool softner such as Colace or Milk of Magnesia while you are taking pain medication.  If you do not have a bowel movement within 2-3 days, switch to daily Miralax.  If still no result - call the office.    Should you have nausea after surgery you may have been prescribed nausea medication. Try taking the  medicine and if you feel that the medicine is not working, please call the office.     Should you be unable to urinate after ~12 hours after surgery - especially a inguinal hernia repair - please call the office or the answering line for instructions.    For any emergencies, please dial 911.    Thank you again for allowing me to participate in your care, and get well soon!      Sincerely,    Dr. Logan T. Mellert

## 2020-09-07 NOTE — Progress Notes (Signed)
Discharge instructions reviewed with pt and she verbalizes understanding.

## 2020-09-07 NOTE — Progress Notes (Signed)
Dr Danne Baxter to bedside to talk with pt.

## 2020-09-07 NOTE — Progress Notes (Signed)
Pt resting, cont to wait for ride.

## 2020-09-07 NOTE — Progress Notes (Signed)
Pt ambulated to restroom without difficulty with one assist.  Pt voided and returned to room and dressed on her own.

## 2020-09-07 NOTE — ED Notes (Signed)
Pt currently being transported to surgery     Loraine Leriche, RN  09/07/20 [phone]

## 2020-09-07 NOTE — ED Notes (Signed)
[place] IV to RUA has infiltrated, pt was previously stuck for PIV multiple times by several nurses with and without [place]. New [place] IV has been placed to [facility] and is patent w/positive blood return. Pt to go to OR now.      Pollie Friar, RN  09/07/20 [phone]

## 2020-09-07 NOTE — ED Notes (Signed)
See downtime charting from 1am to 4am     Loraine Leriche, RN  09/07/20 [phone]

## 2020-09-08 NOTE — Telephone Encounter (Signed)
Name of Caller: Anaston Koehn Call Back Number: [phone]    What Procedure/Surgery did you have done?:Laparoscopic abdominal    Who was your surgeon?:Mellert    What date was your surgery?:09/07/20    What is the reason for your call today (symptom):Bleeding    What provider is being paged?:Mellert    Time page was sent: 9:09 AM    Page Content:  Dr. Danne Baxter did laparoscopic abdominal surgery on 09/07/20. Pt had blood in urine, lack of appetite. Please call pt [phone]

## 2020-09-09 NOTE — Telephone Encounter (Signed)
Spoke with patient. She states she spoke with Dr. Danne Baxter and she is doing better.

## 2020-09-10 LAB — SURGICAL PATHOLOGY

## 2020-09-11 ENCOUNTER — Telehealth

## 2020-09-11 NOTE — Telephone Encounter (Signed)
Spoke with pt regarding low ferritin level at 7.8. Pt has started 18 mg ferrous gluconate in liquid form along with a prenatal mvi. Pt's B12 also low end normal (386) and she will get back to taking 500 mcg daily.  Will recheck in levels in 3 months  Pending orders, please sign,  Thank you!    Pt also had questions regarding her recenet surgery with Dr. Danne Baxter and return to work information. Let her know RD will forward to NP for further guidance.

## 2020-09-13 NOTE — Telephone Encounter (Signed)
Noted.      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      Dawn can you please provide a new letter, that has her RTW date changed to Dec 4.  Please scan and email to her.  Thanks.!!

## 2020-09-14 NOTE — Telephone Encounter (Signed)
Name of Caller: Camika, Marsico Call Back Number: [phone]     What Procedure/Surgery did you have done?laparoscopic abdominal surgery    Who was your surgeon?:Dr. Mellert     What date was your surgery?: 09/07/20     What is the reason for your call today (symptom): abdominal pain     What provider is being paged?: Dr. Jesusita Oka    Time page was sent: 10:25 pm    Page Content: 0'"   09/14/20 10:25 PM   [phone] From: Wilhemena Durie RE: [name] [birth year] RM: 00 [name] [phone] said she has abdominal pain wants to know if she should go to the hospital. Dr. Danne Baxter did her surgery on 09-07-20 laparoscopic abdominal surgery. Please call and advise. Callback Required: NEEDS CALLBACK [facility]  Read 10:26 PM

## 2020-09-16 NOTE — Telephone Encounter (Signed)
Spoke to pt-  She is mainly concerned about the pain she is having and "an infection in the bowel"   Wonders if she should have an antibiotic.    Discussed w DR Mellert.  He reports he ran the small bowel at the time of her surgery, and small bowel looked normal     No intussusception.   Reviewed CT findings w him.  Nothing here to suggest any change in treatment.  Can f/u in office as planned next week.     Pt advised and verbalized understanding.

## 2020-09-16 NOTE — Telephone Encounter (Signed)
See TE from this AM created by this nurse for follow up

## 2020-09-16 NOTE — Telephone Encounter (Signed)
Pt contacted and states she went to [facility].  Noted that CT is in the chart.      Denies fever.  + nausea dn + diarrhea.  States diarrhea is not all the time. States she will have solid stools in between. States consistency and amount of stools vary.  States stomach pain so eating only soup. States ongoing for the last 3-4 days.  States she is taking protonix but has not taken today. States she was given bentyl at the ER and feels that it is not helping, taken 2 doses.  Denies smoking, alcohol-every couple months, caffeine-coffee 2 cups and bottle of pop daily to every other day.  Nsaids taking tylenol arthritis twice per month. Works at [employer] so back pain.  Denies GB.  Last BM was yesterday and that was soft, not black bloody or tarry but had black specks and is green in color.  + flatus and + bloating/distention-last 2-3 days. Denies improvement after having BM. States insides feel like they are about to come out.  States pain is worse with eating but not with food. Fluid is 32 oz per day but "could do better". Protein is untracked but is not good recently due to not feeling well.  Denies difficulty or dysphagia.  States weight 284-197 and is 200 lbs.

## 2020-09-16 NOTE — Telephone Encounter (Signed)
LRYGB 10/09/13 MP    Last OV-09/06/20 with NP, next 09/23/20 with MP    Pt called to say she was seen in the ER over the weekend and was told she has and infection in her bowels.  Pt states she was not given an atb and is calling to find out if she should have been placed on one. Pt states CT also showed dilated sm bowel and asking if that is something to be worried about.

## 2020-09-23 ENCOUNTER — Encounter
Payer: PRIVATE HEALTH INSURANCE | Attending: Nephrology | Primary: Student in an Organized Health Care Education/Training Program

## 2020-09-23 ENCOUNTER — Ambulatory Visit
Admit: 2020-09-23 | Discharge: 2020-09-23 | Payer: PRIVATE HEALTH INSURANCE | Attending: Nephrology | Primary: Student in an Organized Health Care Education/Training Program

## 2020-09-23 DIAGNOSIS — E611 Iron deficiency: Secondary | ICD-10-CM

## 2020-09-23 NOTE — Progress Notes (Signed)
HPI, PHYSICAL EXAMINATION & PLAN  Post-Op      HPI: Patient here for f/u diagnostic laparoscopy-  atient presented to the ER with left-sided abdominal pain.  A CT scan was concerning for a JJ intussusception.  I took her to the operating room and performed a diagnostic laparoscopy.  The JJ was large however it was not intussuscept acutely.  All of her mesenteric defects were closed already.  EGD was normal.    Physical Examination:   BP 111/71    Pulse 93    Temp 97 ??F (36.1 ??C)    Resp 18    Ht 5' 4.5" (1.638 m) Comment: BCC   Wt 207 lb (93.9 kg)    BMI 34.98 kg/m??     General:   This patient is awake, alert, and oriented, and is in no apparent distress.    Cardiac:   Regular rate and rhythm without evidence of murmur.  Respiratory:   Clear to auscultation bilaterally.  Abdomen:   Obese, soft, non-tender, non-distended without masses/ No evidence of abdominal hernia / Incisions consistent with previous surgeries.  Head and Neck:  Obese, normocephalic and atraumatic/soft and supple, no lymphadenopathy or obvious bruits.  Extremities:   No cyanosis, clubbing or edema/ No calf tenderness/No restrictions of movement, is ambulatory without assistance.  Neurological:   Intact x 4 extremities, no focal deficits notes.  Skin:    No rashes or lesions noted.    Rectal:   Deferred    Surgical site:  JG:GEZMO, dry, intact and nontender  Drainage from surgical site: no drainage  Patient does not have a superficial incisional SSI      Assessment:   Abdominal pain- improved.        No orders of the defined types were placed in this encounter.      Medications ordered during this encounter:   Outpatient Encounter Medications as of 09/23/2020   Medication Sig Dispense Refill   ??? ondansetron (ZOFRAN-ODT) 4 MG disintegrating tablet Take 1 tablet by mouth 3 times daily as needed for Nausea or Vomiting 21 tablet 0   ??? Ferrous Sulfate (IRON PO) Take 15 mLs by mouth daily     ??? VITAMIN D PO Take by mouth     ??? vitamin B-1 (THIAMINE) 100  MG tablet Take 100 mg by mouth daily     ??? famotidine (PEPCID) 40 MG tablet Take 40 mg by mouth daily GERD     ??? promethazine (PHENERGAN) 12.5 MG tablet Take 12.5 mg by mouth every 6 hours as needed for Nausea     ??? vitamin B-12 (CYANOCOBALAMIN) 1000 MCG tablet Place 1,000 mcg under the tongue once a week Indications: SUPPLEMENT       No facility-administered encounter medications on file as of 09/23/2020.       Visit Diagnoses:   1. Iron deficiency    2. B12 deficiency    3. Intestinal malabsorption, unspecified type    4. Deficiency of multiple nutrient elements    5. Difficult intravenous access    6. Gastroesophageal reflux disease without esophagitis    7. Vitamin D deficiency    8. Class 1 obesity due to excess calories with serious comorbidity and body mass index (BMI) of 34.0 to 34.9 in adult        Current Medications:  Patient's Medications   New Prescriptions    No medications on file   Previous Medications    FAMOTIDINE (PEPCID) 40 MG TABLET    Take  40 mg by mouth daily GERD    FERROUS SULFATE (IRON PO)    Take 15 mLs by mouth daily    ONDANSETRON (ZOFRAN-ODT) 4 MG DISINTEGRATING TABLET    Take 1 tablet by mouth 3 times daily as needed for Nausea or Vomiting    PROMETHAZINE (PHENERGAN) 12.5 MG TABLET    Take 12.5 mg by mouth every 6 hours as needed for Nausea    VITAMIN B-1 (THIAMINE) 100 MG TABLET    Take 100 mg by mouth daily    VITAMIN B-12 (CYANOCOBALAMIN) 1000 MCG TABLET    Place 1,000 mcg under the tongue once a week Indications: SUPPLEMENT    VITAMIN D PO    Take by mouth   Modified Medications    No medications on file   Discontinued Medications    No medications on file         Plan:    Will call for further f/u if ongoing issues, in general is better.  Electronically signed by Mora Bellman, APRN - CNP on 09/25/2020 at 2:16 PM        Tele health?  Dejak/

## 2020-09-23 NOTE — Progress Notes (Signed)
[facility]  BARIATRIC CARE CENTER  PROGRESS NOTE  POST WEIGHT LOSS SURGERY WEIGHT LOSS MANAGEMENT            Patient: [name]     Date of Birth: 14-Nov-1980         Service Date: 09/23/20         This patient is alone for the evaluation today.    Date of Weight Loss Surgery: 10/09/2013    Procedure Performed: Laparoscopic Roux-en-Y Gastric Bypass    Initial Weight: No data recorded    Diet & Exercise Compliance:  Compliance with Recommended Eating Plan:  Eating 75g protein per day  yes    If no, reason:  Drinking at least 64 ounces of water per day no   If no, reason:  Avoiding caffeine, added sugar, and carbonation no   If no, reason:    Compliance with Recommended Exercise Plan:  Exercising:    yes       If no, reason:        If yes:         Type:   gym          Times per week:    twice         Min per session:    60 minutes/day    Last Visit Weight: 206.2lb    Weight today: 207lb  BMI today: 34.98    Today's weight has increased from the last visit    Reason patient is here today:  Follow up from hospital, surgery on 09/07/2020 Dx Lap .    Pain:  Patient rates pain on scale 0-10 as: 1-2       Falls Risk Assessment  Patient does not take medications which affect BP or mental status   Patient does not have newly prescribed or changed dosage of medications within past 30 days which affect BP or mental status  Patient has not fallen in the past 2 months  Patient does not demonstrate unsteady gait  Patient uses the following ambulatory assistive devices: none  Patient states the presence of the following traits which increases risk of fall: none    Patient is low risk for falls.    Patient has had a time when it was difficult for an IV to be placed.    Patient is not on home O2    Patient has not has a time when it was difficult to intubate them      Post-op Weight Metrics:   %EBWL: % EBWL: 53%  Weight Change Since Last Visit: Weight Change: .8 lbs  Weight Change from Highest Pre-op Weight: Total Weight  Change: -87 lbs       Diabetes   1. Do you currently have diabetes?       []  Yes [x]  No   2. Are you currently prescribed insulin?      []  Yes  [x]  No   []  N/A   3. Are you currently prescribed an oral medication for diabetes?   []  Yes  [x]  No   []  N/A       GERD (Gastroesophageal Reflux Disease)   1. Do you currently have GERD?       [x]  Yes  []  No   2. Do you get heartburn type symptoms more than twice per week? []  Yes  [x]  No   3. Are you currently on a medication for GERD? (not TUMS)  (examples: Prilosec/omeprazole, Zantac/ranitidine, etc.)    [x]   Yes  []  No      Hyperlipidemia (high cholesterol)   1. Do you currently have a diagnosis of high cholesterol?    []  Yes  [x]  No   2. Are you currently prescribed a medication for high cholesterol?          (examples Lipitor/Atorvastatin, Pravastatin, Zetia, Tricor, etc.)   []  Yes  [x]  No   3. Have you been diagnosed with high cholesterol but chosen not         to take medication?       []  Yes  [x]  No      Hypertension (high blood pressure)   1. Do you currently have a diagnosis of Hypertension?    []  Yes  [x]  No   2. Are you currently on a medication for Hypertension?   []  Yes  [x]  No   3. Have you been diagnosed with Hypertension but have chosen   not to take the medication?        []  Yes  [x]  No       Sleep Apnea   1. Do you currently have Sleep Apnea?      []  Yes  [x]  No    2. Are you on a device (CPAP, BiPAP, etc) for Sleep Apnea?   []  Yes  [x]  No    3. Have you been diagnosed with Sleep Apnea but cannot tolerate   or have chosen not to treat?      []  Yes  [x]  No       Completed by: , LPN

## 2020-09-23 NOTE — Other (Unsigned)
Patient Acct Nbr: [id number]   Primary AUTH/CERT:   Primary Insurance Company Name: [employer] Plan name: [facility] HMO PPO  Primary Insurance Group Number: [id number]  Primary Insurance Plan Type: Health  Primary Insurance Policy Number: [id number]    Secondary AUTH/CERT:   Secondary Insurance Company Name: [employer] Plan name: [facility] Community Mdcaid  Secondary Insurance Group Number: [facility]  Secondary Insurance Plan Type: Health  Secondary Insurance Policy Number: [id number]

## 2020-10-14 NOTE — Telephone Encounter (Signed)
How is your fluid intake at this time, average ounces per day?  About 48 oz per day.   Protein intake average grams per day?  My protien could be a lot better its no where near where it should be. I'd say less than 60oz.  Any nausea, vomiting, diarrhea or constipation? Yes the nausea and constipation has returned. I take Miralax and the stool softner. I hadn't had a bowel movement in about 4 days tgen yesterday I was able to go.  Are you still taking the Pepcid 40mg  daily? I haven't taken pepsid but I also don't have any heartburn.  When was your last bowel movement and has there been any change in the pattern in which you have bowel movements. Yes, I've been very gassy and bloated.  Not able to go during that time. I went yesterday and today I went but it does feel like its all out. If that makes sense. Do you still have your gallbladder? No I had it removed in 2006.  Any fevers? No fevers.  Is the pain worse when you eat or does it not change? Yes its worse when I eat... But is constantly there and has fourteen worse since returning to work.  Are you taking your vitamins? I try to take them I don't take them everyday.  Any difficult swallowing when you take medications? None.    Will route to PA for review of answers

## 2020-10-14 NOTE — Telephone Encounter (Signed)
From: [name]  To: Mora Bellman  Sent: 10/13/2020 6:37 AM EST  Subject: The pain has returned    So I returned back to work and I'm on light duty, (which I think its one of their policies because I told them I didn't have any restrictions). The first week we didn't have work so they sent [place] home ( I only work 3 days a week] I'm still having pain in the same spot. Its a constant dull pain it no longer feels like its stabbing me but I know something is not right. I'm still getting bloated and gassy when I eat anything and the pain is there all the time. I will do whatever test we need to get it fixed. At this point I'm thinking I need to just go back to the soups and smoothies.

## 2020-10-14 NOTE — Telephone Encounter (Signed)
LRYGB 10/09/13 MP    Last OV-09/23/20 with NP, next-none scheduled    Per op note from LM on 09/07/20:  Same, large JJ anastomosis however no evidence of acute intussusception.  Both mesenteric defects were closed.    Pt was seen in ER on 09/15/20 at [facility] for:  Pt initially had post prandial pain since abdominal procedure on 11/6. Today patient began to have sharp, constant epigastric pain that is associated with lower back pain. The pain is worse with eating. Patient reports nausea but denies fevers, chills, shortness of breath, vomiting, UTI symptoms, bowel changes, and obstipation.  CT-  CT ABDOMEN/PELVIS WITH CONTRAST   Final Result   IMPRESSION:   1. No acute findings in the abdomen or pelvis.   2. Some clustered loops of bowel in the left upper quadrant, some loops with   some thickening of the wall, probably magnified by underdistention but given   some associated mesenteric edema, may reflect mild enteritis. No evidence of   obstruction.   3. 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   4. Prior cholecystectomy with postcholecystectomy ectasia.   5. 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         Mychart message to pt with further questions for clarification.     Will route to covering PA for review and direction.

## 2020-10-15 ENCOUNTER — Telehealth

## 2020-10-15 NOTE — Telephone Encounter (Signed)
Pt called stating her pain is coming back want to know what to do about it. See MyChart message from 10/13/20

## 2020-10-15 NOTE — Telephone Encounter (Signed)
Spoke to pt and she reports she is having small bowel movements but nothing significant. She has only been taking stool softener and miralax prn. Pt reports she is headed to ED closer to home at this time. Pt lives 2 [place] half hours away so will be going closer to home. Advised to get CT disc as will likely be ordered. Will touch base with her tomorrow.

## 2020-10-17 NOTE — Telephone Encounter (Signed)
Patient scheduled testing as follows: [facility]    Name of Test: CT ABDO PELVIS W/ IV + ORAL   Location of Test (full address): [address]   Date/Time of Test: 10/18/20 9 AM TO DRINK ORAL CONTRAST, 10:30 SCAN    [x]  CT - You are not permitted anything eat 4 hours prior to the exam. 2 hours prior, drink 2-4 8 ounce glasses of water for hydration. If oral contrast is ordered, you must pick up no later than 24 hours prior to exam from any [facility]. You must arrive 15 minutes early to register, wear a mask, bring your photo ID and insurance card.    [x]  Patient made aware of the above testing information and prep.       Authorization for Testing Initiated.  Per [facility] at [facility] Inetta Fermo CT requires auth through [facility]  Per [employer], no auth required    [phone] Name & Number Contacted for Auth: [facility] [place] [phone]  Name of Representative: [facility]   Name of Procedure: CT ABDO PELVIS  Cpt Code: [phone]  Diagnosis Code: R10.84  Location of Procedure:   Is Auth Required:  NO  Call Reference #: [id number]  Pending Case #: NA    Clinical Reviewer: NA  Additional Info if given: NA    Approved Case reference: NA  Date Range for Approved Auth: NA

## 2020-10-17 NOTE — Telephone Encounter (Signed)
Name of Caller: Jed Limerick phone number: [phone]    Relationship to Patient: [facility] Verification    Chief Complaint/Reason for Call: Patient is scheduled for STAT CT ABDOMEN PELVIS W on 10/18/20.  Patient's [facility] Medicaid insurance requires prior authorization.  Is someone able to obtain prior authorization?    Best time of day caller can be reached: Any       Patient advised that office/PCP has 24-48 business hours to return their call: No

## 2020-10-17 NOTE — Telephone Encounter (Signed)
Patient's [place] [employer] plan with member ID [id number] shows termed 10/01/20.  Patient's [facility] Medicaid plan with member ID [id number] shows primary and active.  [facility] Medicaid definitely requires prior authorization for CPT [id number].  Is anyone able to obtain prior authorization so patient's insurance will cover the test?

## 2020-10-17 NOTE — Telephone Encounter (Signed)
See tele for further documentation

## 2020-10-17 NOTE — Telephone Encounter (Signed)
Case # [id number]  Auth# [id number]  Valid 10/17/20 - 12/01/20

## 2020-10-17 NOTE — Telephone Encounter (Signed)
Discussed patient's case with MP as well. Recent CT showing intussception but had dx lap and was not occurring at that time.     Patient was seen at [facility] ER and d/c with dx of constipation.     MP team- please arrange for stat CT scan- ideally tomorrow if possible. Pt lives 2.5 hours away and says she will be up this way tomorrow. Otherwise please Monday.

## 2020-10-17 NOTE — Telephone Encounter (Signed)
Authorization for Testing Initiated.    Company Name & Number Contacted for Auth: [facility] [phone]  Name of Representative: Taya H   Name of Procedure: CT Abdo Pelvis w/ IV + Oral   Cpt Code: 26948  Diagnosis Code: R10.84  Location of Procedure: [address]   Is Auth Required:  yes,   Call Reference #: na  Pending Case #: [id number]    Clinical Reviewer: na  Additional Info if given: na    Approved Case reference: [id number]  Date Range for Approved Auth: 10/17/20 - 12/01/20

## 2020-10-17 NOTE — Telephone Encounter (Signed)
See TE 10/15/2020 for prior auth details.

## 2020-10-18 ENCOUNTER — Ambulatory Visit: Primary: Student in an Organized Health Care Education/Training Program

## 2020-10-18 ENCOUNTER — Inpatient Hospital Stay
Admit: 2020-10-18 | Attending: Physician Assistant | Primary: Student in an Organized Health Care Education/Training Program

## 2020-10-18 DIAGNOSIS — R1084 Generalized abdominal pain: Secondary | ICD-10-CM

## 2020-10-18 MED ORDER — IOPAMIDOL 76 % IV SOLN
76 % | Freq: Once | INTRAVENOUS | Status: AC | PRN
Start: 2020-10-18 — End: 2020-10-18
  Administered 2020-10-18: 16:00:00 75 mL via INTRAVENOUS

## 2020-10-18 NOTE — Telephone Encounter (Signed)
From: [name]  To: Mora Bellman  Sent: 10/17/2020 9:08 PM EST  Subject: New Insurance    Hello, I saw the after visit summary and I have new insurance thru [employer] (new employer). I can give you a copy of the card tomorrow morning.

## 2020-10-18 NOTE — Other (Unsigned)
Patient Acct Nbr: [id number]   Primary AUTH/CERT:   Primary Insurance Company Name: [employer] Plan name: [facility] HMO PPO  Primary Insurance Group Number: [id number]  Primary Insurance Plan Type: Health  Primary Insurance Policy Number: [id number]    Secondary AUTH/CERT: [id number]  Secondary Insurance Company Name: [employer] Plan name: [facility] Community Mdcaid  Secondary Insurance Group Number: [facility]  Secondary Insurance Plan Type: Health  Secondary Insurance Policy Number: [id number]

## 2020-10-21 NOTE — Telephone Encounter (Signed)
LRYGB 10/09/13 MP  Diag lap 09/07/20 with LM    Last OV-09/23/20 with NP, next none    Will route to PA for review of CT and direction.

## 2020-10-21 NOTE — Telephone Encounter (Signed)
Had Dr. Andres Shad review CT findings while in office today. No surgical concern but significant stool burden.     Call to pt to discuss. She explains she follows with GI closer to home and had c scope in July of this year that showed inflammation in colon. Pt reports no further work up has been completed. She explains BM have been loose since ED visit last week when they had her drink a bottle of mag citrate. Advised pt CT still shows significant stool burden- advised another bottle of mag citrate and to f.u with GI. Will fax results of CT. Patient reports pain is intermittent and not as severe as previous. Passing flatus.     MP team- please fax results of CT to GI office- pt sees Donette Larry listed in chart, thanks

## 2020-10-22 NOTE — Telephone Encounter (Signed)
CT report faxed to Dr Allena Katz at [phone]

## 2020-10-24 NOTE — Telephone Encounter (Signed)
Pt called and left VM that she saw her GI provider and they offered her nothing. She was told that she has had an infection for 4 years. Pt reports she has had a normal c scope recently. She is requesting a second opinion. She currently does not have PCP. Requesting [facility].     MP team- please pend referral to [facility] GI, thanks

## 2020-10-25 ENCOUNTER — Telehealth

## 2020-10-25 NOTE — Telephone Encounter (Signed)
Referral pended.

## 2020-10-25 NOTE — Telephone Encounter (Signed)
GI referral pended in TE 10/25/20.

## 2020-10-28 NOTE — Telephone Encounter (Signed)
signed

## 2020-10-29 NOTE — Telephone Encounter (Signed)
Per TE 12/28, patient not requests referral to:  [facility]   Medical group in [place], [place]  [address], [place], [place] [address]  Map of [facility] [place]  ??  [phone]  Fax: [phone]

## 2020-10-29 NOTE — Telephone Encounter (Signed)
Per TE 10/25/20 patient was referred to [facility] based on request from PA  MZ- ok to refer to provider patient requesting?

## 2020-10-29 NOTE — Telephone Encounter (Signed)
[facility] was pt's previous request.   Yes- please process final request, thanks

## 2020-10-29 NOTE — Addendum Note (Signed)
Addended byGraciella Freer. on: 10/29/2020 01:30 PM     Modules accepted: Orders

## 2020-10-29 NOTE — Telephone Encounter (Signed)
LRYGB 10/09/13 MP  Diag lap 09/07/20 with LM    Last OV-09/23/20 with NP, next none    Clinical team, please address pt request for alternate GI referral

## 2020-10-29 NOTE — Telephone Encounter (Signed)
From: [name]  To: Mora Bellman  Sent: 10/29/2020 7:59 AM EST  Subject: GI referral    Is it possible that the referral can be sent somewhere closer to my home? [facility] is [facility] away.     [facility]   Medical group in [place], [place]  [address] [place], [place] [address]  Map of [facility] [place]    [phone]

## 2020-10-29 NOTE — Telephone Encounter (Signed)
Signed, thanks

## 2020-10-29 NOTE — Telephone Encounter (Signed)
New referral pended, see referral TE 12/24

## 2020-10-29 NOTE — Addendum Note (Signed)
Addended by: Tamicka Shimon on: 10/29/2020 01:54 PM     Modules accepted: Orders

## 2020-10-30 NOTE — Telephone Encounter (Signed)
Referral order faxed along with snapshot, CT results and pathology report to [facility] at [phone].   Referral order mailed to pt.

## 2020-11-19 DIAGNOSIS — R1013 Epigastric pain: Secondary | ICD-10-CM

## 2020-11-19 NOTE — Telephone Encounter (Signed)
Patient calls back to say that nobody ever called her back from her initial call over the weekend. Dr. Danne Baxter did laparoscopic abdominal surgery on 09/07/20.  Pt stating stabbing abdominal pain in center of stomach, nausea, pain after eating that began one week ago but started getting worse yesterday and has been constant. Worse when she eats.     She cannot see GI until 2/1. Wants to know what to do in the mean time. Please advise

## 2020-11-19 NOTE — Telephone Encounter (Signed)
LRYGB 10/09/13 MP  Diag lap 09/07/20 with LM  OV LB 09/23/20  Patient states that she has had dull abdominal pain that has been getting worse over 2 weeks and now is stabbing at times. Frequent nausea that is not triggered or relieved by food. No vomiting. No fevers. Denies constipation. Patient had a GI referral placed by [place] on 10/29/20 and her appt is 12/03/20 but patient wants to know what we can do to help her pain until then. She is taking no pain medication or nausea medication

## 2020-11-19 NOTE — Telephone Encounter (Signed)
This is a [facility] patient that Dr. Danne Baxter did a diagnostic lap on and no abnormalities were seen during surgery.  She needs to f/u with [facility].

## 2020-11-19 NOTE — Telephone Encounter (Addendum)
Paged Dr. Sharyn Creamer per Dr. Melonie [place], as Dr. Sharyn Creamer is on call from Dr. Danne Baxter.

## 2020-11-19 NOTE — ED Provider Notes (Signed)
Emergency Department Encounter  [facility] EMERGENCY DEPT    Patient: [name]  MRN: [id number]  DOB: February 18, 1981  Date of Evaluation: 11/19/2020  ED Supervising Physician: Quita Skye, MD    I independently examined and evaluated [name].    In brief, [name] is a 40 y.o. female that presents to the emergency department for the evaluation of epigastric abdominal pain.  Rates as severe.  Ongoing for the past week but got worse yesterday.  She is still passing gas.  She was having constipation but did have a bowel movement in the last 24 hours with laxatives.    Focused exam:   BP: 125/84  No abdominal tenderness palpation on exam.  Lungs are clear.  Overall well-appearing.    Brief ED course/MDM:   CT demonstrates no acute infectious or inflammatory process to explain the patient's symptoms.  She is overall well-appearing and I believe is stable for discharge to home.  Urinalysis shows no infection.  Benign well-appearing exam.  She does have a large surgical history and therefore a broad work-up including CT was performed.         ED Course as of 12/05/20 1413   Wed Nov 20, 2020   0151 Urinalysis:    Glucose, UA Normal   Total Protein, Urine Negative   Bilirubin, Urine Negative   Urobilinogen, Urine Normal   pH, Urine 6.5   Specific Gravity, Urine 1.012   Occult Blood,Urine Negative   Ketones, Urine Negative   Nitrite, Urine Negative   LEUKOCYTES, UA Negative   Appearance Clear   Color, UA Light-Yellow  Urinalysis without infection [RW]   0151 Lipase:    Lipase 93  Lipase without evidence of pancreatitis [RW]   0151 Pregnancy, Urine:    HCG Urine Negative  Pregnancy negative [RW]   0151 Hemogram  (CBC) w/Auto Diff(!):    WBC 3.7   RBC 3.93   Hemoglobin Quant 10.8(!)   Hematocrit 33.4(!)   MCV 85.2   MCH 27.5   MCHC 32.2   RDW 14.4   Platelet Count 352   MPV 8.2   Granulocytes % 46.9   Lymphocyte % 40.6(!)   Monocytes 9.0   Eosinophils 2.1   Basophils 1.4   Absolute Neut # 1.7(!)   Absolute Lymph #  1.5   Absolute Mono # 0.3   Absolute Eos # 0.1   Basophils Absolute 0.1  CBC without leukocytosis without bicytopenia.  There is stable anemia at 10.8 [RW]   1884 Basic Metabolic Panel:    Sodium 138   Potassium 3.6   Chloride 106   CO2 25   Anion Gap 7   Glucose 86   BUN 13   Creatinine 1.07   eGFR African American 75.5   EGFR IF NonAfrican American 65.1   CALCIUM, SERUM, 166063 9.0  BMP with normal renal function and electrolytes [RW]   0151 Lactic Acid, Plasma:    Lactic Acid 0.7  Lactic acid normal [RW]   0332 CT Abdomen Pelvis W Contrast  IMPRESSION:    No evidence of acute infectious or inflammatory process in the abdomen  or pelvis. [RW]      ED Course User Index  [RW] Sherryl Barters, APRN - CNP         All diagnostic, treatment, and disposition decisions were made by myself in conjunction with the APP/Resident. I personally supervised any procedures performed if any.   For all further details of the patient's emergency  department visit, please see their documentation.    (Please note that portions of this note may have been completed with a voice recognition program. Efforts were made to edit the dictations but occasionally words are mis-transcribed.)    Quita Skye, MD  [employer]       Quita Skye, MD  12/05/20 1414

## 2020-11-19 NOTE — Telephone Encounter (Signed)
Name of caller requesting page:Brenetta    Phone Number of caller: [phone]    Facility requesting page: n/a    Reason for Page: Abdominal Pain    Provider paged: [place]      Practice Name of paged provider: [facility]    Page Placed to #: n/a    Time Page was sent or provider contacted: 7:29    Method of Contact: Perfect Serve    Page Content: Dr. Danne Baxter did laparoscopic abdominal surgery on 09/07/20.  Pt stating stabbing abdominal pain in center of stomach, nausea, pain after eating.  Please call pt at [phone].

## 2020-11-19 NOTE — Telephone Encounter (Signed)
LMOVM for pt to rtn call

## 2020-11-19 NOTE — Telephone Encounter (Signed)
CT mid December showed  New small bowel wall thickening in the left upper quadrant.   Findings are concerning for enteritis.   ??   2. 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   ??   ??     Will discuss w DR Andres Shad in am.

## 2020-11-19 NOTE — Telephone Encounter (Signed)
This is a [facility] patient and has been following with them. [name], I'm no sure who's patient she was originally, as Dr Danne Baxter did urgent surgery for her. I'm not sure who she should f/u with - he starts his own [facility] clinic in Feb. Will let you decide her next step, thanks!

## 2020-11-19 NOTE — ED Provider Notes (Signed)
[facility] EMERGENCY DEPT  EMERGENCY DEPARTMENT ENCOUNTER      Pt Name: [name]  MRN: [id number]  [place] [birth year]  Date of evaluation: 11/19/2020  Provider: Sherryl Barters, APRN - CNP     Patient was seen in conjunction with Dr. Rhodia Albright whom also independently obtained history and  evaluated the patient    Due to concern for COVID-19 in the healthcare setting I wore protective eyewear, N95 respirator and surgical mask for the entirety of the encounter.     CHIEF COMPLAINT       Chief Complaint   Patient presents with   ??? Abdominal Pain     started one week ago and has got worse since yesterday. ABD sx on the 6th.          HISTORY OF PRESENT ILLNESS   (Location/Symptom, Timing/Onset,Context/Setting, Quality, Duration, Modifying Factors, Severity) Note limiting factors.   HPI    [name] is a 40 y.o. female who presents to the emergency department with complaints of epigastric abdominal pain.  This has been ongoing for about a week got worse yesterday.  She complains of nausea but no vomiting.  She was constipated a week ago but symptoms improved with laxatives.  She is still passing gas.  No fevers or chills.  No GU symptoms.  She has a history of intussusception and states this feels similar.    Nursing Notes were reviewed.    REVIEW OFSYSTEMS    (2+ for level 4; 10+ level 5)   Review of Systems    All other systems reviewed and are negative except as noted in history of present illness    PAST MEDICAL HISTORY     Past Medical History:   Diagnosis Date   ??? Anemia    ??? Chronic back pain    ??? Deficiency of multiple nutrient elements    ??? Deficiency of multiple vitamins    ??? Fatigue    ??? GERD (gastroesophageal reflux disease)    ??? Intestinal malabsorption    ??? Irregular menstrual cycle    ??? Knee pain    ??? SOB (shortness of breath)        SURGICAL HISTORY       Past Surgical History:   Procedure Laterality Date   ??? BREAST LUMPECTOMY Left 11/02/1998    Benign- [place]   ??? CESAREAN SECTION  11/03/2003   ???  CHOLECYSTECTOMY  11/02/2004    [place]   ??? GASTRIC BYPASS SURGERY  10/09/2013    [id number]   ??? HYSTERECTOMY  11/02/2010    [facility]   ??? OTHER SURGICAL HISTORY  09/07/2020    DX laparoscopy   ??? SALPINGECTOMY  11/02/2006    Ectopic Pregnancy- [place]       Current Discharge Medication List      CONTINUE these medications which have NOT CHANGED    Details   ondansetron (ZOFRAN-ODT) 4 MG disintegrating tablet Take 1 tablet by mouth 3 times daily as needed for Nausea or Vomiting  Qty: 21 tablet, Refills: 0      Ferrous Sulfate (IRON PO) Take 15 mLs by mouth daily      VITAMIN D PO Take by mouth      vitamin B-1 (THIAMINE) 100 MG tablet Take 100 mg by mouth daily      promethazine (PHENERGAN) 12.5 MG tablet Take 12.5 mg by mouth every 6 hours as needed for Nausea  vitamin B-12 (CYANOCOBALAMIN) 1000 MCG tablet Place 1,000 mcg under the tongue once a week Indications: SUPPLEMENT             ALLERGIES     Amoxicillin, Pcn [penicillins], and Zoloft [sertraline hcl]    FAMILY HISTORY       Family History   Problem Relation Age of Onset   ??? High Blood Pressure Mother    ??? Heart Disease Mother    ??? Thyroid Disease Mother    ??? Mental Illness Mother    ??? High Blood Pressure Father    ??? Diabetes Father    ??? Mental Illness Father    ??? High Blood Pressure Sister    ??? Thyroid Disease Sister    ??? Mental Illness Sister    ??? Breast Cancer Other         Aunt        SOCIAL HISTORY       Social History     Socioeconomic History   ??? Marital status: Divorced     Spouse name: Not on file   ??? Number of children: Not on file   ??? Years of education: Not on file   ??? Highest education level: Not on file   Occupational History   ??? Not on file   Tobacco Use   ??? Smoking status: Never Smoker   ??? Smokeless tobacco: Never Used   Substance and Sexual Activity   ??? Alcohol use: Yes     Alcohol/week: 0.0 standard drinks     Comment: varies - 1 glass per night of wine    ??? Drug use: No   ??? Sexual activity: Not on  file   Other Topics Concern   ??? Not on file   Social History Narrative   ??? Not on file     Social Determinants of Health     Financial Resource Strain:    ??? Difficulty of Paying Living Expenses: Not on file   Food Insecurity:    ??? Worried About Running Out of Food in the Last Year: Not on file   ??? Ran Out of Food in the Last Year: Not on file   Transportation Needs:    ??? Lack of Transportation (Medical): Not on file   ??? Lack of Transportation (Non-Medical): Not on file   Physical Activity:    ??? Days of Exercise per Week: Not on file   ??? Minutes of Exercise per Session: Not on file   Stress:    ??? Feeling of Stress : Not on file   Social Connections:    ??? Frequency of Communication with Friends and Family: Not on file   ??? Frequency of Social Gatherings with Friends and Family: Not on file   ??? Attends Religious Services: Not on file   ??? Active Member of Clubs or Organizations: Not on file   ??? Attends Archivist Meetings: Not on file   ??? Marital Status: Not on file   Intimate Partner Violence:    ??? Fear of Current or Ex-Partner: Not on file   ??? Emotionally Abused: Not on file   ??? Physically Abused: Not on file   ??? Sexually Abused: Not on file   Housing Stability:    ??? Unable to Pay for Housing in the Last Year: Not on file   ??? Number of Places Lived in the Last Year: Not on file   ??? Unstable Housing in the Last Year: Not on file  SCREENINGS           PHYSICAL EXAM    (up to 7 for level 4, 8 or more for level 5)     ED Triage Vitals [11/19/20 2336]   BP Temp Temp Source Pulse Resp SpO2 Height Weight   119/80 97.7 ??F (36.5 ??C) Temporal 75 16 99 % -- --       Physical Exam  Vitals and nursing note reviewed.   Constitutional:       General: She is not in acute distress.     Appearance: She is well-developed. She is not toxic-appearing or diaphoretic.   HENT:      Head: Normocephalic and atraumatic.   Eyes:      General:         Right eye: No discharge.         Left eye: No discharge.      Conjunctiva/sclera:  Conjunctivae normal.   Cardiovascular:      Rate and Rhythm: Normal rate.      Pulses: Normal pulses.      Heart sounds: Normal heart sounds.   Pulmonary:      Effort: Pulmonary effort is normal. No respiratory distress.      Breath sounds: Normal breath sounds.   Abdominal:      General: Abdomen is flat. Bowel sounds are normal. There is no distension.      Palpations: Abdomen is soft.      Tenderness: There is no abdominal tenderness. There is no guarding or rebound.   Musculoskeletal:         General: No deformity. Normal range of motion.      Cervical back: Normal range of motion and neck supple.   Skin:     General: Skin is warm and dry.   Neurological:      Mental Status: She is alert and oriented to person, place, and time.      GCS: GCS eye subscore is 4. GCS verbal subscore is 5. GCS motor subscore is 6.   Psychiatric:         Behavior: Behavior normal.         Thought Content: Thought content normal.         Judgment: Judgment normal.         DIAGNOSTIC RESULTS     EKG (Per Emergency Physician): All EKG's areinterpreted by the Emergency Department Physician in the absence of a cardiologist. Please see their note for interpretation of EKG    RADIOLOGY (Per Emergency Physician):     Interpretation per the Radiologist below, if available at the time ofthis note:  CT Abdomen Pelvis W Contrast    Result Date: 11/20/2020  Patient Name:                KOLBY, MYUNG MRN:                         [id number] Acct#:                       [id number] Computed Tomography ACCESSION                 EXAM DATE/TIME           PROCEDURE                 ORDERING PROVIDER [id number]             11/20/2020 02:25 EST  CT Abdomen/Pelvis w/ IV   Kashden Deboy, NP, Meshell Abdulaziz Contrast (IV Onl CPT code [phone] Reason For Exam (CT Abdomen/Pelvis w/ IV Contrast (IV Onl) Epigastric pain, previous gastric bypass Report CT ABDOMEN AND PELVIS WITH CONTRAST CLINICAL INDICATION: Abdominal pain. TECHNIQUE: Multi-axial 36m sections through the  abdomen and pelvis following 75 mL of Isoview contrast media. Oral contrast was administered. Coronal and sagittal reconstructions were reviewed. COMPARISON: October 18, 2020. FINDINGS:  Lower thorax: Normal. Stomach: Postsurgical changes of gastric bypass. Liver: Normal size and contours. No focal lesion. Biliary tree: Post cholecystectomy. No biliary dilatation. Spleen: Normal.  Adrenals: Normal. Pancreas: Normal. Kidneys: Symmetric contrast enhancement without evidence of hydronephrosis.  No focal renal lesion is identified. Free air or fluid:  None.  Mesenteric/retroperitoneal: No adenopathy or inflammation. Aorta: Normal caliber of aorta and bilateral common iliac arteries. Bowel: Normal appendix without inflammatory change in the right lower quadrant. No inflammatory change or bowel dilatation is noted. Urinary bladder: Unremarkable. Abdominal wall/soft tissues: No ventral hernia is evident.                                           Computed Tomography Report Inguinal: No lymphadenopathy. Osseous structures:  Unremarkable osseous structures. No suspicious osseous lesion. IMPRESSION:  No evidence of acute infectious or inflammatory process in the abdomen or pelvis. Report Dictated on Workstation: [id number] ---** Final ---** Dictated: 11/20/2020 3:14 am Dictating Physician: EHaynes Hoehn MD, JASON Signed Date and Time: 11/20/2020 3:19 am Signed by: EHaynes Hoehn MD, JASON Transcribed Date and Time: 11/20/2020 3:14      ED BEDSIDE ULTRASOUND:   Performed by ED Physician - none    LABS:  Labs Reviewed   CBC WITH AUTO DIFFERENTIAL - Abnormal; Notable for the following components:       Result Value    Hemoglobin 10.8 (*)     Hematocrit 33.4 (*)     Lymphocyte % 40.6 (*)     Absolute Neut # 1.7 (*)     All other components within normal limits    Narrative:     Test Performed by [facility] [address] [place]  [address]  BASIC METABOLIC PANEL    Narrative:     Test Performed by [place] [address], [place]  [address]  HEPATIC FUNCTION PANEL    Narrative:     Test Performed by [place] [address], [place] [address]  LACTIC ACID, PLASMA    Narrative:     Test Performed by [place] [address] [place]  [address]  LIPASE    Narrative:     Test Performed by [place] [address] [place]  [address]  URINALYSIS    Narrative:     Test Performed by [facility] [address], [place]  [address]  PREGNANCY, URINE    Narrative:     Test Performed by [facility] [address], [place]  [address]       All other labs were within normal range or not returned as of this dictation.    EMERGENCY DEPARTMENT COURSE and DIFFERENTIAL DIAGNOSIS/MDM:   Vitals:    Vitals:    11/19/20 2336 11/20/20 0049 11/20/20 0245   BP: 119/80 125/84    Pulse:  75 64 69   Resp: 16 16 16    Temp: 97.7 ??F (36.5 ??C)     TempSrc: Temporal     SpO2: 99% 99% 100%       Medications   sodium chloride flush 0.9 % injection 3 mL (has no administration in time range)   morphine sulfate (PF) injection 4 mg (4 mg IntraVENous Given 11/20/20 0033)   0.9 % sodium chloride bolus (0 mLs IntraVENous Stopped 11/20/20 0102)   ondansetron (ZOFRAN) injection 4 mg (4 mg IntraVENous Given 11/20/20 0033)       MDM.  Patient is a 40 year old female who presents to the ED with upper abdominal pain.  She overall has a benign exam and is hemodynamically stable.  Broad work-up initiated given her extensive surgical history.  She has no findings of any acute surgical process within her abdomen.  There is no evidence of acute renal failure, liver failure pancreatitis.  Urinalysis negative for infection.  She is to have close follow-up with her PCP.  We will start back on her Pepcid.    ED Course as of 11/20/20 0347   Wed Nov 20, 2020   0151 Urinalysis:    Glucose, UA Normal   Total Protein, Urine Negative   Bilirubin, Urine Negative   Urobilinogen, Urine Normal   pH, Urine 6.5   Specific Gravity,  Urine 1.012   Occult Blood,Urine Negative   Ketones, Urine Negative   Nitrite, Urine Negative   LEUKOCYTES, UA Negative   Appearance Clear   Color, UA Light-Yellow  Urinalysis without infection [RW]   0151 Lipase:    Lipase 93  Lipase without evidence of pancreatitis [RW]   0151 Pregnancy, Urine:    HCG Urine Negative  Pregnancy negative [RW]   0151 Hemogram  (CBC) w/Auto Diff(!):    WBC 3.7   RBC 3.93   Hemoglobin Quant 10.8(!)   Hematocrit 33.4(!)   MCV 85.2   MCH 27.5   MCHC 32.2   RDW 14.4   Platelet Count 352   MPV 8.2   Granulocytes % 46.9   Lymphocyte % 40.6(!)   Monocytes 9.0   Eosinophils 2.1   Basophils 1.4   Absolute Neut # 1.7(!)   Absolute Lymph # 1.5   Absolute Mono # 0.3   Absolute Eos # 0.1   Basophils Absolute 0.1  CBC without leukocytosis without bicytopenia.  There is stable anemia at 10.8 [RW]   2595 Basic Metabolic Panel:    Sodium 138   Potassium 3.6   Chloride 106   CO2 25   Anion Gap 7   Glucose 86   BUN 13   Creatinine 1.07   eGFR African American 75.5   EGFR IF NonAfrican American 65.1   CALCIUM, SERUM, [id number] 9.0  BMP with normal renal function and electrolytes [RW]   0151 Lactic Acid, Plasma:    Lactic Acid 0.7  Lactic acid normal [RW]   0332 CT Abdomen Pelvis W Contrast  IMPRESSION:    No evidence of acute infectious or inflammatory process in the abdomen  or pelvis. [RW]      ED Course User Index  [RW] Sherryl Barters, APRN - CNP       REVAL:         CRITICAL CARE TIME   Total Critical Care time was  minutes, excluding separately reportable procedures.  There was a high probability of clinically significant/life threatening deteriorationin the patient's condition which required my urgent intervention.  CONSULTS:  None    :  Unless otherwise noted below, none     Procedures    FINAL IMPRESSION      1. Abdominal pain, epigastric          DISPOSITION/PLAN   DISPOSITION Decision To Discharge 11/20/2020 03:32:15 AM      PATIENT REFERRED TO:  [facility]  [address]  [place] [address]  [phone]          DISCHARGE MEDICATIONS:  Current Discharge Medication List             (Please note:  Portions of this note were completed with a voice recognition program.  Efforts were made to edit the dictations but occasionally words and phrases are mis-transcribed.)  Form v2016.J.[id number]    Sherryl Barters, APRN - CNP  [employer]        Sherryl Barters, APRN - CNP  11/20/20 [phone]

## 2020-11-20 ENCOUNTER — Inpatient Hospital Stay
Admit: 2020-11-20 | Discharge: 2020-11-20 | Disposition: A | Discharge status: Home / Self Care | Attending: Emergency Medicine

## 2020-11-20 LAB — CBC WITH AUTO DIFFERENTIAL
Absolute Baso #: 0.1 10*3/uL (ref 0.0–0.2)
Absolute Eos #: 0.1 10*3/uL (ref 0.0–0.5)
Absolute Lymph #: 1.5 10*3/uL (ref 1.0–4.3)
Absolute Mono #: 0.3 10*3/uL (ref 0.0–0.8)
Absolute Neut #: 1.7 10*3/uL — ABNORMAL LOW (ref 1.8–7.0)
Basophils: 1.4 % (ref 0.0–2.0)
Eosinophils: 2.1 % (ref 1.0–6.0)
Granulocytes %: 46.9 % (ref 40.0–80.0)
Hematocrit: 33.4 % — ABNORMAL LOW (ref 35.0–47.0)
Hemoglobin: 10.8 g/dL — ABNORMAL LOW (ref 11.7–16.0)
Lymphocyte %: 40.6 % — ABNORMAL HIGH (ref 20.0–40.0)
MCH: 27.5 pg (ref 26.0–34.0)
MCHC: 32.2 % (ref 32.0–36.0)
MCV: 85.2 fL (ref 79.0–98.0)
MPV: 8.2 fL (ref 7.4–10.4)
Monocytes: 9 % (ref 2.0–10.0)
Platelets: 352 10*3/uL (ref 140–440)
RBC: 3.93 10*6/uL (ref 3.80–5.20)
RDW: 14.4 % (ref 11.5–14.5)
WBC: 3.7 10*3/uL (ref 3.6–10.7)

## 2020-11-20 LAB — URINALYSIS
Bilirubin Urine: NEGATIVE mg/dL
Glucose, Ur: NORMAL mg/dL (ref <70)
Ketones, Urine: NEGATIVE mg/dL
LEUKOCYTES, UA: NEGATIVE Leu/uL
Nitrite, Urine: NEGATIVE NA
Occult Blood,Urine: NEGATIVE mg/dL
Specific Gravity, Urine: 1.012 NA (ref 1.005–1.030)
Total Protein, Urine: NEGATIVE mg/dL
Urobilinogen, Urine: NORMAL mg/dL (ref 0–1)
pH, Urine: 6.5 NA (ref 5.0–8.0)

## 2020-11-20 LAB — BASIC METABOLIC PANEL
Anion Gap: 7 mmol/L (ref 3–13)
BUN: 13 mg/dL (ref 9–20)
CO2: 25 mmol/L (ref 22–30)
Calcium: 9 mg/dL (ref 8.4–10.4)
Chloride: 106 mmol/L (ref 98–107)
Creatinine: 1.07 mg/dL (ref 0.52–1.25)
EGFR IF NonAfrican American: 65.1 mL/min (ref >60)
Glucose: 86 mg/dL (ref 70–100)
Potassium: 3.6 mmol/L (ref 3.5–5.1)
Sodium: 138 mmol/L (ref 135–145)
eGFR African American: 75.5 mL/min (ref >60)

## 2020-11-20 LAB — HEPATIC FUNCTION PANEL
ALT: 14 U/L (ref 0–34)
AST: 29 U/L (ref 15–46)
Albumin,Serum: 4.3 g/dL (ref 3.5–5.0)
Alkaline Phosphatase: 74 U/L (ref 38–126)
Bilirubin, Direct: 0 mg/dL (ref 0.0–0.3)
Total Bilirubin: 0.4 mg/dL (ref 0.2–1.3)
Total Protein: 7.6 g/dL (ref 6.3–8.2)

## 2020-11-20 LAB — LIPASE: Lipase: 93 U/L (ref 23–300)

## 2020-11-20 LAB — PREGNANCY, URINE: HCG Urine: NEGATIVE NA

## 2020-11-20 LAB — LACTIC ACID: Lactic Acid: 0.7 mmol/L (ref 0.7–2.0)

## 2020-11-20 MED ORDER — FAMOTIDINE 40 MG PO TABS
40 MG | ORAL_TABLET | Freq: Every day | ORAL | 0 refills | Status: AC
Start: 2020-11-20 — End: ?

## 2020-11-20 MED ORDER — SODIUM CHLORIDE 0.9 % IV BOLUS
0.9 | Freq: Once | INTRAVENOUS | Status: AC
Start: 2020-11-20 — End: 2020-11-20
  Administered 2020-11-20: 06:00:00 via INTRAVENOUS

## 2020-11-20 MED ORDER — NORMAL SALINE FLUSH 0.9 % IV SOLN
0.9 % | Freq: Three times a day (TID) | INTRAVENOUS | Status: DC
Start: 2020-11-20 — End: 2020-11-20

## 2020-11-20 MED ORDER — ONDANSETRON HCL 4 MG/2ML IJ SOLN
4 MG/2ML | Freq: Once | INTRAMUSCULAR | Status: AC
Start: 2020-11-20 — End: 2020-11-20
  Administered 2020-11-20: 06:00:00 via INTRAVENOUS

## 2020-11-20 MED ORDER — MORPHINE SULFATE (PF) 4 MG/ML IJ SOLN
4 MG/ML | INTRAMUSCULAR | Status: DC | PRN
Start: 2020-11-20 — End: 2020-11-20
  Administered 2020-11-20: 06:00:00 4 mg via INTRAVENOUS

## 2020-11-20 MED FILL — ONDANSETRON HCL 4 MG/2ML IJ SOLN: 4 MG/2ML | INTRAMUSCULAR | Qty: 2

## 2020-11-20 MED FILL — MORPHINE SULFATE 4 MG/ML IJ SOLN: 4 mg/mL | INTRAMUSCULAR | Qty: 1

## 2020-11-20 NOTE — ED Notes (Signed)
MD at bedside     Midge Aver, RN  11/20/20 [phone]

## 2020-11-20 NOTE — ED Notes (Signed)
Pt to CT     Clarita Leber, RN  11/20/20 [phone]

## 2020-11-20 NOTE — ED Notes (Signed)
To bedside to d/c patient. Pt. States she asked the physician to get the patient advocate for her and that she is not ready for d/c. Pt. Did request to have IV removed and taken off monitor.      Midge Aver, RN  11/20/20 [phone]

## 2020-11-20 NOTE — Other (Unsigned)
Patient Acct Nbr: [id number]   Primary AUTH/CERT:   Primary Insurance Company Name: [employer] Plan name: [facility] HMO PPO  Primary Insurance Group Number: [id number]  Primary Insurance Plan Type: Health  Primary Insurance Policy Number: [id number]    Secondary AUTH/CERT:   Secondary Insurance Company Name: [employer] Plan name: [facility] Community Mdcaid  Secondary Insurance Group Number: [facility]  Secondary Insurance Plan Type: Health  Secondary Insurance Policy Number: [id number]

## 2020-11-20 NOTE — Consults (Signed)
Department of General Surgery  Surgical Service 4  Resident Consult Note      PATIENT NAME: [name]    DOB:  02-10-1981    MRN:  [id number]  ATTENDING PHYSICIAN:  Lamonte Sakai, MD  ADMIT DATE:  11/19/2020    TODAY'S DATE:  11/20/2020       CHIEF COMPLAINT:  Chronic abdominal pain  Chief Complaint   Patient presents with    Abdominal Pain     started one week ago and has got worse since yesterday. ABD sx on the 6th.         Reason for Consult:  Pt request      HISTORY OF PRESENT ILLNESS:    [name] is a 40 y.o. 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 No evidence of stricture or marginal ulcer on EGD. She reports intermittent stabbing epigastric pain since 2014 that comes and goes and is worse after eating. She has taken carafate and PPI in the past but refuses to take it anymore because she doesn't feel it helps. She denies smoking. Passing frequent flatus and having daily BMs. Has lost 90 lbs since surgery and has been stable for multiple years.     CT A/P w/ IV and PO contrast no evidence of acute infectious or inflammatory process. No swirl. Contrast was not timed in a delayed fashion so suboptimal to evaluate full length of the small bowel but no evidence of obstruction, gas throughout the length of the colon and rectum. No free air or free fluid.    In the ED WBC 3.7, LA 0.7, hgb 10.8, LFTs and lipase wnl.      Past Medical History:   Diagnosis Date    Anemia     Chronic back pain     Deficiency of multiple nutrient elements     Deficiency of multiple vitamins     Fatigue     GERD (gastroesophageal reflux disease)     Intestinal malabsorption     Irregular menstrual cycle     Knee pain     SOB (shortness of breath)        Past Surgical History:   Procedure  Laterality Date    BREAST LUMPECTOMY Left 11/02/1998    Benign- [place]    CESAREAN SECTION  11/03/2003    CHOLECYSTECTOMY  11/02/2004    [place]    GASTRIC BYPASS SURGERY  10/09/2013    LRYGB-POZSGAY    HYSTERECTOMY  11/02/2010    [facility]- [facility]    OTHER SURGICAL HISTORY  09/07/2020    DX laparoscopy    SALPINGECTOMY  11/02/2006    Ectopic Pregnancy- [facility]       Medications Prior to Admission:   Prior to Admission medications    Medication Sig Start Date End Date Taking? Authorizing Provider   famotidine (PEPCID) 40 MG tablet Take 1 tablet by mouth daily GERD 11/20/20  Yes Victory Dakin, APRN - CNP   ondansetron (ZOFRAN-ODT) 4 MG disintegrating tablet Take 1 tablet by mouth 3 times daily as needed for Nausea or Vomiting 09/07/20   Raleigh Nation, MD   Ferrous Sulfate (IRON PO) Take 15 mLs by mouth daily    Historical Provider, MD   VITAMIN D PO Take by mouth    Historical Provider, MD  vitamin B-1 (THIAMINE) 100 MG tablet Take 100 mg by mouth daily    Historical Provider, MD   promethazine (PHENERGAN) 12.5 MG tablet Take 12.5 mg by mouth every 6 hours as needed for Nausea    Historical Provider, MD   vitamin B-12 (CYANOCOBALAMIN) 1000 MCG tablet Place 1,000 mcg under the tongue once a week Indications: SUPPLEMENT    Historical Provider, MD       Allergies:  Amoxicillin, Pcn [penicillins], and Zoloft [sertraline hcl]     Social History     Socioeconomic History    Marital status: Divorced     Spouse name: Not on file    Number of children: Not on file    Years of education: Not on file    Highest education level: Not on file   Occupational History    Not on file   Tobacco Use    Smoking status: Never Smoker    Smokeless tobacco: Never Used   Substance and Sexual Activity    Alcohol use: Yes     Alcohol/week: 0.0 standard drinks     Comment: varies - 1 glass per night of wine     Drug use: No    Sexual activity: Not on file   Other Topics Concern    Not on file   Social History Narrative    Not on file      Social Determinants of Health     Financial Resource Strain:     Difficulty of Paying Living Expenses: Not on file   Food Insecurity:     Worried About Running Out of Food in the Last Year: Not on file    Ran Out of Food in the Last Year: Not on file   Transportation Needs:     Lack of Transportation (Medical): Not on file    Lack of Transportation (Non-Medical): Not on file   Physical Activity:     Days of Exercise per Week: Not on file    Minutes of Exercise per Session: Not on file   Stress:     Feeling of Stress : Not on file   Social Connections:     Frequency of Communication with Friends and Family: Not on file    Frequency of Social Gatherings with Friends and Family: Not on file    Attends Religious Services: Not on file    Active Member of Clubs or Organizations: Not on file    Attends Banker Meetings: Not on file    Marital Status: Not on file   Intimate Partner Violence:     Fear of Current or Ex-Partner: Not on file    Emotionally Abused: Not on file    Physically Abused: Not on file    Sexually Abused: Not on file   Housing Stability:     Unable to Pay for Housing in the Last Year: Not on file    Number of Places Lived in the Last Year: Not on file    Unstable Housing in the Last Year: Not on file        Family History   Problem Relation Age of Onset    High Blood Pressure Mother     Heart Disease Mother     Thyroid Disease Mother     Mental Illness Mother     High Blood Pressure Father     Diabetes Father     Mental Illness Father     High Blood Pressure Sister  Thyroid Disease Sister     Mental Illness Sister     Breast Cancer Other         Aunt       REVIEW OF SYSTEMS:    Review of Systems   Constitutional: Negative for chills and fever.   HENT: Negative for hearing loss and trouble swallowing.    Eyes: Negative for photophobia and visual disturbance.   Respiratory: Negative for chest tightness and shortness of breath.    Cardiovascular: Negative for chest pain and palpitations.    Gastrointestinal: Positive for abdominal pain and nausea. Negative for abdominal distention, blood in stool, constipation, diarrhea and vomiting.   Genitourinary: Negative for difficulty urinating and dysuria.   Musculoskeletal: Negative for back pain and neck pain.   Skin: Negative for rash and wound.   Neurological: Negative for weakness and light-headedness.   Psychiatric/Behavioral: The patient is not nervous/anxious and is not hyperactive.        PHYSICAL EXAM:    Vitals:    11/20/20 0245   BP:    Pulse: 69   Resp: 16   Temp:    SpO2: 100%       No intake/output data recorded.    CONSTITUTIONAL:  awake, alert, cooperative, no apparent distress, and appears stated age  EYES:  Lids and lashes normal, extra ocular muscles intact  HENT:  Normocephalic, without obvious abnormality, atraumatic  NECK:  Supple, symmetrical, trachea midline, no adenopathy, skin normal  LUNGS:  No increased work of breathing, good air exchange, clear to auscultation bilaterally  CARDIOVASCULAR:  regular rate and rhythm, normal S1 and S2  ABDOMEN:  soft, non-distended, non-tender, no masses palpated, no hepatosplenomegaly  MUSCULOSKELETAL:  There is no redness, warmth, or swelling of the joints.  Full range of motion noted.    NEUROLOGIC:  Awake, alert, oriented to name, place and time.  Marland Kitchen.  SKIN:  normal skin color, texture, turgor    DATA:  CBC:   Lab Results   Component Value Date    WBC 3.7 11/20/2020    WBC 4.6 08/01/2020    RBC 3.93 11/20/2020    HGB 10.8 11/20/2020    HCT 33.4 11/20/2020    MCV 85.2 11/20/2020    MCH 27.5 11/20/2020    MCHC 32.2 11/20/2020    RDW 14.4 11/20/2020    PLT 352 11/20/2020    MPV 8.2 11/20/2020     BMP:    Lab Results   Component Value Date    NA 138 11/20/2020    K 3.6 11/20/2020    CL 106 11/20/2020    CO2 25 11/20/2020    BUN 13 11/20/2020    LABALBU 4.3 11/20/2020    LABALBU 3.8 08/01/2020    CREATININE 1.07 11/20/2020    CALCIUM 9.0 11/20/2020    LABGLOM 75 08/01/2020    GLUCOSE 86 11/20/2020      Hepatic Function Panel:    Lab Results   Component Value Date    ALKPHOS 74 11/20/2020    ALT 14 11/20/2020    AST 29 11/20/2020    PROT 7.6 11/20/2020    BILITOT 0.4 11/20/2020    BILIDIR 0.0 11/20/2020    LABALBU 4.3 11/20/2020    LABALBU 3.8 08/01/2020     PT/INR:  No results found for: PROTIME, INR  Troponin:    Lab Results   Component Value Date    TROPONINI <0.015 01/23/2016     LIPASE:    Lab Results  Component Value Date    LIPASE 93 11/20/2020       IMAGING:   CT Abdomen Pelvis W Contrast  Result Date: 11/20/2020  IMPRESSION:  No evidence of acute infectious or inflammatory process in the abdomen or pelvis. Report Dictated on Workstation: [id number] ---** Final ---** Dictated: 11/20/2020 3:14 am Dictating Physician: Dutch Quint, MD, JASON Signed Date and Time: 11/20/2020 3:19 am Signed by: Dutch Quint, MD, JASON Transcribed Date and Time: 11/20/2020 3:14      ASSESSMENT AND PLAN:    This is a 40 y.o. female with chronic abdominal pain s/p RYGB and cholecystectomy with benign abdominal pain and unremarkable workup.     -no acute surgical intervention  -prior diagnostic laparoscopy w/ evidence of dilated JJ, but no active intussusception. Pt is requesting follow up appointment w/ Dr. Jesusita Oka, whom she reportedly had discussed the possibility of a JJ revision for suspected intermittent intussusception . She is greeable to outpatient follow up to discuss potential role of JJ revision as solution to symptoms  -she declined prescription for PPI carafate or any pain mediciation  -ok for discharge from surgical standpoint  -discussed w/ Dr. Sharyn Creamer on call for Dr. Caffie Damme, MD  PGY-2  11/20/2020 4:03 AM        [facility] - Surgery  [facility]  Patient Name: [name]  Date: 11/20/20    I discussed the above case with the surgical resident by phone.  I reviewed the relevant patient data, resident's note and made corrections as needed.  I agree with the resident's  plan.    Electronically signed by Epimenio Foot, MD  on 11/20/2020 at 8:03 AM

## 2020-11-20 NOTE — Telephone Encounter (Signed)
Leisa, I attempted to schedule this patient with you, and she is refusing to see anyone except for Dr. Jesusita Oka.  I responded, "Dr. Andres Shad is your surgeon," she then responded, "I'll only see Dr Jesusita Oka.  Nobody else there."  Please advise.  I let her know I'll get back with her.

## 2020-11-20 NOTE — Telephone Encounter (Signed)
Left a message asking for return call.    Need to know what the issue is, as appt w DR Jesusita Oka would be a month away- as she is considered a new pt to him.

## 2020-11-20 NOTE — ED Notes (Signed)
[facility] EMERGENCY DEPT  Quick Cognitive Screen Performed     [x]  Answered by Patient  []  Input provided by Family/Visitor   []  Unable to be performed due to Patient Medical Status/not at risk for elopement       (I.e. intubated/critical care patient)    1. Patient Identifies Current Year?    [x]  Pt able to identify current year   []  Pt unable to identify current year     2.   Patient Identifies Current Month?    [x]  Pt able to identify current month   []  Pt unable to identify current month    3.   Recently Confused?    [x]  Negative history of confusion   []  Positive history of confusion    Quick Cognitive Screen Result:    [x]  Negative (no further action required)  []  Positive (document interventions using .qcsintervention) once patient in room             , RN  11/20/20 0050

## 2020-11-20 NOTE — ED Notes (Signed)
Pt. Informed there is no advocate here at this hour. Pt. Asked for d.c papers and walked out. Pt. Refused this RN to review paperwork with her.      Midge Aver, RN  11/20/20 [phone]

## 2020-11-20 NOTE — Telephone Encounter (Signed)
DR Andres Shad had reviewed CT previously.    PT referred to GI.  Please arrange post op other - abd pain visit w me, next week. Thanks.

## 2020-11-20 NOTE — ED Notes (Signed)
Assumed care of pt. Report from [employer]. Pt. Ambulatory to restroom.      Midge Aver, RN  11/20/20 [phone]

## 2020-12-02 ENCOUNTER — Ambulatory Visit
Admit: 2020-12-02 | Discharge: 2020-12-02 | Payer: PRIVATE HEALTH INSURANCE | Attending: Surgery | Primary: Student in an Organized Health Care Education/Training Program

## 2020-12-02 DIAGNOSIS — R1084 Generalized abdominal pain: Secondary | ICD-10-CM

## 2020-12-02 NOTE — Other (Unsigned)
Patient Acct Nbr: [id number]   Primary AUTH/CERT:   Primary Insurance Company Name: [employer] Plan name: [facility] HMO PPO  Primary Insurance Group Number: [id number]  Primary Insurance Plan Type: Health  Primary Insurance Policy Number: [id number]    Secondary AUTH/CERT:   Secondary Insurance Company Name: [employer] Plan name: [facility] Community Mdcaid  Secondary Insurance Group Number: [facility]  Secondary Insurance Plan Type: Health  Secondary Insurance Policy Number: [id number]

## 2020-12-02 NOTE — Progress Notes (Signed)
[facility]  SURGICAL WEIGHT LOSS MANAGEMENT PROGRAM   PROGRESS NOTE - OTHER POST-OP     Patient: [name]     Date of Birth: 11-23-1980         Service Date: 12/02/20         Reason patient is here today:  AVBD PAin.    This patient is alone for the evaluation today.    Date of Weight Loss Surgery: 10/09/2013    Procedure Performed: Laparoscopic Roux-en-Y Gastric Bypass    Initial Weight: No data recorded    Last Visit Weight: 207     Today's weight is increased from the last visit 210.2lb  Today's BMI: 35.52    Diet & Exercise Compliance   Eating > 75g of protein per day:  No  Drinking at least 64 ounces of water per day: Yes- In between most days can get 64oz  Avoiding caffeine, added sugar and carbonated drinks:  No    Diabetes   1. Do you currently have diabetes?       No    2. Are you currently prescribed insulin?      No    3. Are you currently prescribed an oral medication for diabetes?   No      GERD (Gastroesophageal Reflux Disease)   1. Do you currently have GERD?       No      2. Do you get heartburn type symptoms more than twice per week? No    3. Are you currently on a medication for GERD? (not TUMS)  (examples: Prilosec/omeprazole, Zantac/ranitidine, etc.)    Yes pepcid 40mg      Hyperlipidemia (high cholesterol)   1. Do you currently have a diagnosis of high cholesterol?    No   2. Are you currently prescribed a medication for high cholesterol?          (examples Lipitor/Atorvastatin, Pravastatin, Zetia, Tricor, etc.)   No   3. Have you been diagnosed with high cholesterol but chosen not         to take medication?       No     Hypertension (high blood pressure)   1. Do you currently have a diagnosis of Hypertension?    No    2. Are you currently on a medication for Hypertension?   No   3. Have you been diagnosed with Hypertension but have chosen  No   not to take the medication?             Sleep Apnea   1. Do you currently have Sleep Apnea?      No   2. Are you on a device (CPAP,  BiPAP, etc) for Sleep Apnea?   No    3. Have you been diagnosed with Sleep Apnea but cannot tolerate              or have chosen not to treat?      No    Pain:  Patient rates pain on scale 0-10 as: 4 upper L quad dull pain currently normally pt states feels like a charlie horse or could be a stabbing pain that brings her to her knees. Pt states gets constipated for about 2wks after pain pt states eating and drinking triggers it but sometimes happens during sleep. Pain is random.    Falls Risk Assessment  Patient does not take medications which affect BP or mental status   Patient does not have newly  prescribed or changed dosage of medications within past 30 days which affect BP or mental status  Patient has not fallen in the past 2 months  Patient does not demonstrate unsteady gait  Patient uses the following ambulatory assistive devices: none  Patient states the presence of the following traits which increases risk of fall: none    Patient is low risk for falls. If high or moderate risk, patient instructed not to ambulate independently in the Center, and cord for call light placed within reach of patient.    Patient is not on home O2    Completed by: [employer], LPN

## 2020-12-02 NOTE — Progress Notes (Signed)
HPI, PHYSICAL EXAMINATION & PLAN  POST-OP     HPI: Patient here today for 12 month post-weight loss surgery follow up    Female patient feeling well. Denies nausea, vomiting, dysphagia, or any GERD Sx. Currently is  on any PPI. Patient states diet and exercise is going fairly well. Currently is  eating 65-75 gm/day protein, and is  compliant with prescribed multivitamins and supplements.    Vital signs are stable. Labs were Completed. All labs were: low HGB        Pt last seen in November as f/u diagnostic laparoscopy-  At that time,  CT scan was concerning for a JJ intussusception.  The JJ was large however it was not intussuscept acutely.  All of her mesenteric defects were closed already.  EGD was normal.    Pt reports ongoing abdominal pain- occurs suddenly-  Also reports BM is twice in 2 wks.  Only takes stool softener.  Fluid intake about 24 oz.  Eating 2 times daily.    Went to ER DEC 2021, and Jan 19/2022 - CT completed - Dec Ct with small bowel inflammation - personally reviewed.    Review of Systems   Constitutional: Positive for fatigue. Negative for chills and fever.   HENT: Negative for sore throat and trouble swallowing.    Respiratory: Negative.  Negative for chest tightness and shortness of breath.    Cardiovascular: Negative.  Negative for chest pain and leg swelling.   Gastrointestinal: Positive for abdominal pain, constipation and nausea. Negative for abdominal distention, diarrhea and vomiting.   Genitourinary: Negative.  Negative for decreased urine volume.   Musculoskeletal: Negative.    Skin: Negative for rash.   Neurological: Negative for dizziness, syncope and weakness.   Psychiatric/Behavioral: Negative for confusion.       Physical Examination:   BP 137/86    Pulse 86    Temp 97.3 ??F (36.3 ??C)    Resp 16    Ht 5' 4.5" (1.638 m)    Wt 210 lb 3.2 oz (95.3 kg)    BMI 35.52 kg/m??     General:   This patient is awake, alert, and oriented, and is in no apparent distress.    Abdomen:   Obese,  soft, non-tender, non-distended without masses/ No evidence of abdominal hernia / Incisions consistent with previous surgeries.  Extremities:   No cyanosis, clubbing or edema/ No calf tenderness/No restrictions of movement, is ambulatory without assistance.  Neurological:   Intact x 4 extremities, no focal deficits notes.  Skin:    No rashes or lesions noted.    Rectal:   Deferred    Current Medications:   Patient's Medications   New Prescriptions    No medications on file   Previous Medications    FAMOTIDINE (PEPCID) 40 MG TABLET    Take 1 tablet by mouth daily GERD    FERROUS SULFATE (IRON PO)    Take 15 mLs by mouth daily    ONDANSETRON (ZOFRAN-ODT) 4 MG DISINTEGRATING TABLET    Take 1 tablet by mouth 3 times daily as needed for Nausea or Vomiting    PROMETHAZINE (PHENERGAN) 12.5 MG TABLET    Take 12.5 mg by mouth every 6 hours as needed for Nausea    VITAMIN B-1 (THIAMINE) 100 MG TABLET    Take 100 mg by mouth daily    VITAMIN B-12 (CYANOCOBALAMIN) 1000 MCG TABLET    Place 1,000 mcg under the tongue once a week Indications: SUPPLEMENT  VITAMIN D PO    Take by mouth   Modified Medications    No medications on file   Discontinued Medications    No medications on file       Medications ordered during this encounter:   Outpatient Encounter Medications as of 12/02/2020   Medication Sig Dispense Refill   ??? famotidine (PEPCID) 40 MG tablet Take 1 tablet by mouth daily GERD 30 tablet 0   ??? ondansetron (ZOFRAN-ODT) 4 MG disintegrating tablet Take 1 tablet by mouth 3 times daily as needed for Nausea or Vomiting 21 tablet 0   ??? Ferrous Sulfate (IRON PO) Take 15 mLs by mouth daily     ??? VITAMIN D PO Take by mouth     ??? vitamin B-1 (THIAMINE) 100 MG tablet Take 100 mg by mouth daily     ??? promethazine (PHENERGAN) 12.5 MG tablet Take 12.5 mg by mouth every 6 hours as needed for Nausea     ??? vitamin B-12 (CYANOCOBALAMIN) 1000 MCG tablet Place 1,000 mcg under the tongue once a week Indications: SUPPLEMENT       No  facility-administered encounter medications on file as of 12/02/2020.       COLONOSCOPY - Sept 2021- done in [facility]- ok but for polyp per pt.    No orders of the defined types were placed in this encounter.      Visit Diagnoses:   1. Generalized abdominal pain    2. Iron deficiency    3. Vitamin B deficiency    4. Vitamin D deficiency    5. Class 2 severe obesity due to excess calories with serious comorbidity and body mass index (BMI) of 35.0 to 35.9 in adult (HCC)    6. Deficiency of multiple nutrient elements    7. Intestinal malabsorption, unspecified type    8. Obstipation      Plan:    1). Diet and Exercise: 84 lb lost and 51% EBWL  2). Continue to monitor for signs and symptoms of GERD / off PPI  3). Labs: low HGB  4). Psych concerns : No  5). Excessive skin concerns:   6). Obstipation- take Miralax daily.  Note, neg CT, EGD, C scope in last few months.      I fell her recurrent abdominal pain is likely due to enlarged JJ with intermittent intussusception.  Although this had spontaneously reduced by the time patient had diagnostic lap 11/21, the CT from 12/21 had small bowel edema at the JJ anastomosis.  She points to pain in the left mid abdomen.  Will plan to resect and redo JJ.  Discussed risks of leak, admission to hospital, bleeding, recurrent intussusception and possibility of not resolving pain. She is agreeable to surgery.    I personally performed the evaluation and management of [name] in the development of a treatment plan for this patient. I personally interviewed the patient and performed an individual physical examination. In addition, I discussed the patient's condition and treatment options with them. I have also reviewed and agree with the past medical, family and social history unless otherwise noted. All of the patient's questions were answered.     I discussed/counseled the patient regarding the postoperative care plan for this patient. The patient was seen and examined  independently and relevant data reviewed by myself. A full chart review was performed.    Patient Care Team:  Clarene Essex, DO (General Surgery)  [facility]  Donette Larry, MD as Consulting Physician (  Gastroenterology)  Donia Pounds, MD as Consulting Physician (Interventional Cardiology)    Electronically signed by Mora Bellman, APRN - CNP on 12/02/2020 at 9:12 AM

## 2020-12-04 ENCOUNTER — Telehealth

## 2020-12-04 NOTE — Telephone Encounter (Signed)
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  In November 2021- she presented to the ER with left-sided abdominal pain.  A CT scan was concerning for a JJ intussusception. DR MEllert took pt to the operating room and performed a diagnostic laparoscopy.  The JJ was large however it was not intussuscept acutely.  All of her mesenteric defects were closed already.  EGD was normal.     Pt has been in the ER twice since that surgery w abdominal pain that is severe.  The CT abdomen/pelvis done Jan 19 did not show anything acute.  However, DR Andres Shad reviewed the CT scans since November, and the pt does have thickening of the small bowel at the [facility].  She was seen in the office Jan 31 and reports ongoing abdominal pain that is recurring and causes her to double over. Pain is severe when this occurs and occurs 5 of 7 days.   She is unable to consume adequate nutrition.      PLAN :  URgent revision of the JJ    Will need authorization please.

## 2020-12-04 NOTE — Telephone Encounter (Signed)
Expedited prior authorization has been initiated for Pt regarding JJ Revision surgery (CPT [phone]).  Insurance company should respond within 24-72 hours.  Authorization # [id number].

## 2020-12-04 NOTE — Telephone Encounter (Signed)
Noted, thank you.

## 2020-12-06 NOTE — Telephone Encounter (Signed)
Patient left me a VM, when I returned call she stated that she has spoken with her insurance and they told her procedure is approved.  Informed patient I would pass on her message to the team.

## 2020-12-09 NOTE — Telephone Encounter (Signed)
Patient is calling in asking for a surgery date.  Patient stated she knows its been approved and is being expedited, but stated,  "I'm in pain or I wouldn't be calling so much."  She's asking for a call back with a date.

## 2020-12-09 NOTE — Telephone Encounter (Signed)
Spoke to patient , explained that we are working on obtaining date

## 2020-12-09 NOTE — Telephone Encounter (Signed)
Advised by Alease Medina that the [name] is in place.

## 2020-12-11 NOTE — Telephone Encounter (Signed)
Bloodwork, Chest Xray & EKG Please  Covid as well

## 2020-12-11 NOTE — Telephone Encounter (Signed)
ASHLEY HER DOS IS FEB 15.  PLEASE contact for lab, CXR, EKG and covid testing. Thanks.

## 2020-12-11 NOTE — Addendum Note (Signed)
Addended byKae Heller on: 12/11/2020 04:04 PM     Modules accepted: Orders

## 2020-12-11 NOTE — Addendum Note (Signed)
Addended by: Eulogio Ditch on: 12/11/2020 04:03 PM     Modules accepted: Orders

## 2020-12-11 NOTE — Telephone Encounter (Signed)
Signed, thanks.

## 2020-12-11 NOTE — Telephone Encounter (Signed)
Just received CC today. Contacted patient, she lives 3 hours away. She will come tomorrow morning before PAT and complete testing, she will attend PAT at 1230, and have covid testing done there. She was working today and unable come today to complete anything. Patient advised to contact me with any questions.

## 2020-12-12 ENCOUNTER — Inpatient Hospital Stay: Attending: Nephrology | Primary: Student in an Organized Health Care Education/Training Program

## 2020-12-12 ENCOUNTER — Inpatient Hospital Stay: Admit: 2020-12-12 | Attending: Surgery | Primary: Student in an Organized Health Care Education/Training Program

## 2020-12-12 DIAGNOSIS — E611 Iron deficiency: Secondary | ICD-10-CM

## 2020-12-12 DIAGNOSIS — R109 Unspecified abdominal pain: Secondary | ICD-10-CM

## 2020-12-12 LAB — BASIC METABOLIC PANEL
Anion Gap: 5 mmol/L (ref 3–13)
BUN: 11 mg/dL (ref 9–20)
CO2: 28 mmol/L (ref 22–30)
Calcium: 9 mg/dL (ref 8.4–10.4)
Chloride: 103 mmol/L (ref 98–107)
Creatinine: 1.02 mg/dL (ref 0.52–1.25)
EGFR IF NonAfrican American: 69 mL/min (ref >60)
Glucose: 84 mg/dL (ref 70–100)
Potassium: 4.2 mmol/L (ref 3.5–5.1)
Sodium: 137 mmol/L (ref 135–145)
eGFR African American: 79.9 mL/min (ref >60)

## 2020-12-12 LAB — CBC
Hematocrit: 35 % (ref 35.0–47.0)
Hemoglobin: 11.1 g/dL — ABNORMAL LOW (ref 11.7–16.0)
MCH: 27.3 pg (ref 26.0–34.0)
MCHC: 31.7 % — ABNORMAL LOW (ref 32.0–36.0)
MCV: 85.9 fL (ref 79.0–98.0)
MPV: 9 fL (ref 7.4–10.4)
Platelets: 347 10*3/uL (ref 140–440)
RBC: 4.07 10*6/uL (ref 3.80–5.20)
RDW: 14.2 % (ref 11.5–14.5)
WBC: 3.6 10*3/uL (ref 3.6–10.7)

## 2020-12-12 LAB — IRON: Iron: 103 ug/dL (ref 37–170)

## 2020-12-12 LAB — ALBUMIN: Albumin,Serum: 4.1 g/dL (ref 3.5–5.0)

## 2020-12-12 LAB — COVID-19: SARS-CoV-2: NOT DETECTED

## 2020-12-12 LAB — VITAMIN B12: Vitamin B-12: 274 pg/mL (ref 239–931)

## 2020-12-12 LAB — FERRITIN: Ferritin: 8 ng/mL (ref 6–137)

## 2020-12-12 NOTE — Other (Unsigned)
Patient Acct Nbr: [id number]   Primary AUTH/CERT:   Primary Insurance Company Name: [employer] Plan name: [facility] HMO PPO  Primary Insurance Group Number: [id number]  Primary Insurance Plan Type: Health  Primary Insurance Policy Number: [id number]    Secondary AUTH/CERT:   Secondary Insurance Company Name: [employer] Plan name: [facility] Community Mdcaid  Secondary Insurance Group Number: [facility]  Secondary Insurance Plan Type: Health  Secondary Insurance Policy Number: [id number]

## 2020-12-12 NOTE — H&P (Signed)
Comprehensive PreSurgical History and Physical      Name: [name] MRN: [id number] DOB: April 08, 1981    (Age-40 y.o.)     Date of Service: Pt seen/examined on 12/12/2020     Chief Complaint:  Epigastric pain post gastric bypass 2014    History Of Present Illness:    40 y.o. female who we are asked to see/evaluate by Clarene Essex, DO for pre-operative evaluation prior to Procedure Notes:  REVISION OF JEJUNOSTOMY  GENERAL ANESTHESIA.  Patient reports she has been obese for several years. Conservative measures have failed to sustain weight loss and had gastric bypass in 2014, now has epigastric pain, nausea all the time. she has been following up with the bariatric center.     Patient denies exertional chest pain/shortness of breath. Denies dizziness, syncope, lightheadedness.    Denies fever, chills, weakness or fatigue. Patient denies any recent illness, infections, or wounds. Patient denies abdominal pain, nausea, vomiting, diarrhea, or constipation.      Patient denies hx of CAD, CHF, MI, TIA/CVA, diabetes, COPD, asthma, OSA, DVT/PE.      Past Medical History:        Diagnosis Date   ??? Anemia    ??? Chronic back pain    ??? Deficiency of multiple nutrient elements    ??? Deficiency of multiple vitamins    ??? Fatigue    ??? GERD (gastroesophageal reflux disease)    ??? Hypoglycemia    ??? Intestinal malabsorption    ??? Irregular menstrual cycle    ??? Knee pain    ??? Sleep apnea    ??? SOB (shortness of breath)        Past Surgical History:        Procedure Laterality Date   ??? BREAST LUMPECTOMY Left 11/02/1998    Benign- [place]   ??? CESAREAN SECTION  11/03/2003   ??? CHOLECYSTECTOMY  11/02/2004    [place]   ??? COLONOSCOPY     ??? ECTOPIC PREGNANCY SURGERY     ??? ENDOSCOPY, COLON, DIAGNOSTIC     ??? GASTRIC BYPASS SURGERY  10/09/2013    [id number]   ??? HYSTERECTOMY  11/02/2010    [facility]- [facility]   ??? OTHER SURGICAL HISTORY  09/07/2020    DX laparoscopy   ??? POLYPECTOMY      colon   ??? SALPINGECTOMY  11/02/2006    Ectopic  Pregnancy- [facility]       Medications Prior to Admission:    Prior to Admission medications    Medication Sig Start Date End Date Taking? Authorizing Provider   dicyclomine (BENTYL) 20 MG tablet Take 20 mg by mouth every 6 hours as needed   Yes Historical Provider, MD   gabapentin (NEURONTIN) 100 MG capsule Take 100 mg by mouth daily. Unsure of dose   Yes Historical Provider, MD   amitriptyline (ELAVIL) 10 MG tablet Take 10 mg by mouth nightly   Yes Historical Provider, MD   famotidine (PEPCID) 40 MG tablet Take 1 tablet by mouth daily GERD 11/20/20  Yes Victory Dakin, APRN - CNP   ondansetron (ZOFRAN-ODT) 4 MG disintegrating tablet Take 1 tablet by mouth 3 times daily as needed for Nausea or Vomiting 09/07/20  Yes Raleigh Nation, MD   Ferrous Sulfate (IRON PO) Take 15 mLs by mouth daily   Yes Historical Provider, MD   VITAMIN D PO Take by mouth once a week    Yes Historical Provider, MD   vitamin B-1 (THIAMINE) 100 MG tablet  Take 100 mg by mouth daily   Yes Historical Provider, MD   promethazine (PHENERGAN) 12.5 MG tablet Take 12.5 mg by mouth every 6 hours as needed for Nausea   Yes Historical Provider, MD   vitamin B-12 (CYANOCOBALAMIN) 1000 MCG tablet Place 1,000 mcg under the tongue once a week Indications: SUPPLEMENT   Yes Historical Provider, MD       ANTICOAGULATION: No  CHRONIC STEROID USE:  No  CHRONIC NARCOTIC USE: No    Allergies:  Amoxicillin, Pcn [penicillins], and Zoloft [sertraline hcl]    If patient has opioid allergy, is it okay to take Acetaminophen: N/A      Social History:   TOBACCO:   reports that she has never smoked. She has never used smokeless tobacco.  ETOH:   reports current alcohol use.  Social History     Substance and Sexual Activity   Drug Use No        Family History:      Problem Relation Age of Onset   ??? High Blood Pressure Mother    ??? Heart Disease Mother    ??? Thyroid Disease Mother    ??? Mental Illness Mother    ??? High Blood Pressure Father    ??? Diabetes Father    ??? Mental Illness  Father    ??? High Blood Pressure Sister    ??? Thyroid Disease Sister    ??? Mental Illness Sister    ??? Breast Cancer Other         Aunt       REVIEW OF SYSTEMS:   Pertinent positives as noted in the HPI. All other systems reviewed and negative.    PHYSICAL EXAM:  Vitals:  BP 116/77    Pulse 77    Temp 96.9 ??F (36.1 ??C) (Temporal)    Ht 5\' 6"  (1.676 m)    Wt 163 lb (73.9 kg)    SpO2 99%    BMI 26.31 kg/m??   BMI Classification:  Overweight (BMI 25.0-29.9)  General appearance: No apparent distress, appears stated age and cooperative.  HEENT: Normal cephalic, atraumatic without obvious deformity. Pupils equal, round, and reactive to light.  Extra ocular muscles intact. Conjunctivae/corneas clear.  Neck: No jugular venous distention. Trachea midline.  Respiratory:  Normal respiratory effort. Clear to auscultation, bilaterally without Rales/Wheezes/Rhonchi.  Cardiovascular: Regular rate and rhythm with normal S1/S2 without murmurs, rubs or gallops.  Abdomen: Soft, non-tender, non-distended with normal bowel sounds.  Musculoskeletal: No clubbing, cyanosis or edema bilaterally. Full ROM of all extremities.   Skin: Skin color, texture, turgor normal.  No rashes or lesions.  Neurologic:  Neurovascularly intact without any focal sensory/motor deficits. Cranial nerves: II-XII intact, grossly non-focal.  Psychiatric: Alert and oriented, thought content appropriate, normal insight      Labs:   Lab Results   Component Value Date    WBC 3.7 11/20/2020    HGB 10.8 (L) 11/20/2020    HCT 33.4 (L) 11/20/2020    MCV 85.2 11/20/2020    PLT 352 11/20/2020     Lab Results   Component Value Date    NA 138 11/20/2020    K 3.6 11/20/2020    CL 106 11/20/2020    CO2 25 11/20/2020    BUN 13 11/20/2020    CREATININE 1.07 11/20/2020    GLUCOSE 86 11/20/2020    CALCIUM 9.0 11/20/2020    PROT 7.6 11/20/2020    LABALBU 4.3 11/20/2020    BILITOT 0.4 11/20/2020  ALKPHOS 74 11/20/2020    AST 29 11/20/2020    ALT 14 11/20/2020    LABGLOM 75 (A) 08/01/2020        Lee's Simple Cardiac Risk Index:  High-risk surgery (intraperitoneal, intrathoracic or suprainguinal vascular surgery): no  Coronary artery disease: no  Congestive heart failure: no  History of cerebrovascular disease: no  Insulin treatment for diabetes mellitus: no  Preoperative serum creatinine > 2.0mg /dL: no    Total:   Interpretation:  0 Points Class I   1 Point Class II   2 Points Class III   >=3 Points Class IV        OSA Screening (STOP-Bang)  NO NOT COMPLETE IF ALREADY DIAGNOSED WITH OSA  Do you snore loudly (loud enough to be heard through closed doors, or your bed partner elbows you for snoring at night)?no    Do you often feel tired, fatigued, or sleepy during the daytime (such as falling asleep during driving)?no    Has anyone observed you stop breathing or choking/gasping during your sleep?no    Do you have or are being treated for high blood pressure? no    Body mass index more than 35 kg/m2?  no    Age older than 40 years old? no    Neck size large? (measured around Adam's apple)  For female, is your shirt collar 17 inches or larger?  no  For female, is your shirt collar 16 inches or larger?no    Gender = Female? no    STOP BANG SCORE = 0    Information from PAT significant to anesthetic history as recorded by Leo Fray, APRN - CNP  on 12/12/2020        Anesthesia Component:    Any problems with anesthesia?: Past General Anesthetic without complications  History of difficult intubation?: None reported  History of difficult IV access?: None reported  Family History of problems with anesthesia?: None noted   Risk Factors PONV:    Female: Yes (+1)  Motion sick or prior PONV: no  NonSmoker: Nonsmoker - (+1)  Opiods for Procedure: unknown            Airway and Dentition Patient PAT Education - Risk Assesment      ASA Score:ASA 1- Healthy nonsmoker  Mallampati  PAT assesment: 2        TMD: >4cm  Mouth Size: WNL  Neck: WNL  Dentures: left upper cavity  Partials: None  Overall Dentition: Teeth intact  with none loose,      GERD:None      Frailty Risk Assessment:                   No Risk Factors unless listed   Patient received procedure specific education prior to surgery per nursing     Tobacco:none    ETOH:  reports current alcohol use.  Drug:   Social History     Substance and Sexual Activity   Drug Use No        Functional Capacity: (>4 METS implies low cardiovascular risk from surgery):  Climb a flight of stairs or walk up a hill (5.50 METs)    PAT Pain Score:None     Postop Pain Management Plan (Pain consult ordered?): Pain consult not indicated at this time             EKG:  I have reviewed the EKG if indicated and there is no concern for ischemic changes or significant heart block.  ECHO and LP:FXTK on file   No results found for: LVEF, LVEFMODE    ASSESSMENT/PLAN:  Patient is considered intermediate  risk for this low/intermediate risk procedure/surgery (Procedure Notes:  REVISION OF JEJUNOSTOMY  GENERAL ANESTHESIA) with no reducible risk factors. Based on the above evaluation, the benefits of the planned procedure likely exceed the risks.  The patient is medically optimized to proceed with the planned procedure without any further cardiopulmonary testing.    1) Epigastric pain post bariatric bypass      Procedure above, BMP, Albumin, COVID-19, EKG   2) iron deficiency      Denies taking iron pills  3) B12 deficeincy      Non complaint with vitamin B12  4) GERD   Non-controlled on   pepcid  avoidance of triggers encouraged    5) nausea  Manage with zofran    Labs Ordered:  NO - COMPLETE PRIOR TO PAT VISIT  COVID-19 TESTING ORDERED: []   ECG Ordered:  NO - COMPLETE PRIOR TO PAT VISIT  Sleep Referral Ordered:  NO - NEGATIVE SCREEN PER SLEEP REFERRAL PROTOCOL          Electronically signed by: , APRN - CNP     Date: 12/12/2020 at 12:14 PM               PAT Protocol referenced includes:  1) Anesthesia Lab Protocol Orders  2) Perioperative Cardiovascular Risk Assessment  3) Anesthesia  Assessment   4) Pain Assessment and Acute Pain Service Consult (if appropriate)  5) Medical Clearance/Consult from Internal Medicine (IMS)  6) Shower/Wash Order (for designated surgeries)  7) OSA Screen and Sleep Clinic Referral (if appropriate)

## 2020-12-12 NOTE — H&P (Deleted)
This patient was initially registered under an incorrect medical record number for DOS [id number].    If you need to review information that was documented in the wrong MRN, please refer to XXXXX MRN XXXXXX.    Chart correction in process.

## 2020-12-12 NOTE — Discharge Instructions (Signed)
Shower with the Hibiclens product given to you in Pre-Admission Testing. Follow the instructions and wear clean clothes to bed and clean linen on the bed the night before surgery. Follow the instructions and shower the morning of surgery and wear clean, comfortable clothes to the hospital.      Please bring a photo ID and insurance information     No smoking, no alcohol 24 hours prior to surgery.    CLEAR LIQUID DIET THE DAY BEFORE YOUR SURGERY.    PLEASE REFER TO YOUR BINDER IN REGARDS TO EATING, DRINKING AND MEDICATION INSTRUCTIONS BEFORE SURGERY. CALL THE BARIATRIC CENTER IF YOU HAVE ANY QUESTIONS.      You will receive a reminder call the day before surgery with your Same Day Surgery arrival time.    If you have specific questions, please call your surgeon.        You may use or  be dropped off at the valet parking located at the main entrance on [address] and take the [facility] elevator to the first floor for same day surgery. Take a left after exiting the elevator and check in at the desk. Free parking day of surgery.    You may use the parking in the Main deck. Take the level one bridge to the H building and follow the signs for same day surgery. Check in at the desk. Free parking day of surgery.

## 2020-12-12 NOTE — Other (Unsigned)
Patient Acct Nbr: [id number]   Primary AUTH/CERT:   Primary Insurance Company Name: [employer] Plan name: [facility] HMO PPO  Primary Insurance Group Number: [id number]  Primary Insurance Plan Type: Health  Primary Insurance Policy Number: [id number]    Secondary AUTH/CERT:   Secondary Insurance Company Name: [employer] Plan name: [facility] Community Mdcaid  Secondary Insurance Group Number: [facility]  Secondary Insurance Plan Type: Health  Secondary Insurance Policy Number: [id number]

## 2020-12-13 ENCOUNTER — Telehealth

## 2020-12-13 NOTE — Telephone Encounter (Signed)
Called today to request inpatient precertification:     [x]  Inpatient precertification requested from (name of insurance rep): [employer]     [x]  Inpatient precertification complete      Precertification #    CPT    DOS:12-17-20

## 2020-12-13 NOTE — Telephone Encounter (Signed)
LM for pt to CB regarding declining Hemoglobin, ferritin and insufficient B12.

## 2020-12-13 NOTE — Telephone Encounter (Signed)
-----   Message from Mora Bellman, APRN - CNP sent at 12/13/2020  8:05 AM EST -----  Declining values.

## 2020-12-16 NOTE — Telephone Encounter (Signed)
Spoke with pt regarding low end Hemoglobin levels and ferritin. Pt to get back to taking MVI with iron following revisional surgery 12/17/20. Pt has been taking B12 at 500 mcg daily, will continue as well following surgery.   Will recheck levels in 3 months, pending orders, please sign,  Thank you so much!

## 2020-12-17 ENCOUNTER — Inpatient Hospital Stay
Admit: 2020-12-17 | Discharge: 2020-12-19 | Disposition: A | Discharge status: Home / Self Care | Payer: PRIVATE HEALTH INSURANCE | Source: Home / Self Care | Attending: Surgery | Admitting: Surgery

## 2020-12-17 DIAGNOSIS — G8918 Other acute postprocedural pain: Secondary | ICD-10-CM

## 2020-12-17 LAB — HEMOGLOBIN AND HEMATOCRIT
Hematocrit: 38.1 % (ref 35.0–47.0)
Hemoglobin: 12.1 g/dL (ref 11.7–16.0)

## 2020-12-17 LAB — BASIC METABOLIC PANEL
Anion Gap: 9 mmol/L (ref 3–13)
BUN: 9 mg/dL (ref 9–20)
CO2: 24 mmol/L (ref 22–30)
Calcium: 8.9 mg/dL (ref 8.4–10.4)
Chloride: 104 mmol/L (ref 98–107)
Creatinine: 1.32 mg/dL — ABNORMAL HIGH (ref 0.52–1.25)
EGFR IF NonAfrican American: 50.5 mL/min — AB (ref >60)
Glucose: 148 mg/dL — ABNORMAL HIGH (ref 70–100)
Potassium: 4.3 mmol/L (ref 3.5–5.1)
Sodium: 137 mmol/L (ref 135–145)
eGFR African American: 58.5 mL/min — AB (ref >60)

## 2020-12-17 LAB — POC PREGNANCY UR-QUAL: HCG, Urine, POC: NEGATIVE

## 2020-12-17 MED ORDER — SUGAMMADEX SODIUM 200 MG/2ML IV SOLN
200 | INTRAVENOUS | Status: AC
Start: 2020-12-17 — End: 2020-12-17

## 2020-12-17 MED ORDER — GABAPENTIN 100 MG PO CAPS
100 MG | Freq: Once | ORAL | Status: AC
Start: 2020-12-17 — End: 2020-12-17
  Administered 2020-12-17: 12:00:00 via ORAL

## 2020-12-17 MED ORDER — PHENYLEPHRINE HCL (PRESSORS) 1 MG/10ML IV SOSY
1 | INTRAVENOUS | Status: AC
Start: 2020-12-17 — End: 2020-12-17

## 2020-12-17 MED ORDER — HYDROMORPHONE HCL 1 MG/ML IJ SOLN
1 MG/ML | INTRAMUSCULAR | Status: DC | PRN
Start: 2020-12-17 — End: 2020-12-17

## 2020-12-17 MED ORDER — ROCURONIUM BROMIDE 50 MG/5ML IV SOLN
50 | INTRAVENOUS | Status: AC
Start: 2020-12-17 — End: 2020-12-17

## 2020-12-17 MED ORDER — MAGNESIUM SULFATE 2000 MG/50 ML IVPB PREMIX
2 | INTRAVENOUS | Status: AC
Start: 2020-12-17 — End: 2020-12-17

## 2020-12-17 MED ORDER — NORMAL SALINE FLUSH 0.9 % IV SOLN
0.9 % | INTRAVENOUS | Status: DC | PRN
Start: 2020-12-17 — End: 2020-12-19

## 2020-12-17 MED ORDER — LIDOCAINE HCL (PF) 2 % IJ SOLN
2 | INTRAMUSCULAR | Status: AC
Start: 2020-12-17 — End: 2020-12-17

## 2020-12-17 MED ORDER — MORPHINE SULFATE (PF) 4 MG/ML IJ SOLN
4 MG/ML | INTRAMUSCULAR | Status: DC | PRN
Start: 2020-12-17 — End: 2020-12-19
  Administered 2020-12-18: 02:00:00 2 mg via INTRAVENOUS

## 2020-12-17 MED ORDER — ONDANSETRON 4 MG PO TBDP
4 MG | ORAL_TABLET | Freq: Three times a day (TID) | ORAL | 0 refills | Status: AC | PRN
Start: 2020-12-17 — End: ?

## 2020-12-17 MED ORDER — ONDANSETRON HCL 4 MG PO TABS
4 MG | ORAL_TABLET | Freq: Three times a day (TID) | ORAL | 0 refills | Status: DC | PRN
Start: 2020-12-17 — End: 2020-12-23

## 2020-12-17 MED ORDER — SODIUM CHLORIDE 0.9 % IV SOLN
0.9 | INTRAVENOUS | Status: DC | PRN
Start: 2020-12-17 — End: 2020-12-19

## 2020-12-17 MED ORDER — DEXAMETH SOD PHOS-BUPIV-EPIN 0.01-0.375 %-1:200000 IJ SOSY
0.01-0.375 | INTRAMUSCULAR | Status: AC
Start: 2020-12-17 — End: 2020-12-17

## 2020-12-17 MED ORDER — HYDRALAZINE HCL 20 MG/ML IJ SOLN
20 MG/ML | INTRAMUSCULAR | Status: DC | PRN
Start: 2020-12-17 — End: 2020-12-17

## 2020-12-17 MED ORDER — ALPRAZOLAM 0.25 MG PO TBDP
0.25 MG | ORAL | Status: DC | PRN
Start: 2020-12-17 — End: 2020-12-17

## 2020-12-17 MED ORDER — SODIUM CHLORIDE 0.9 % IV SOLN
0.9 % | INTRAVENOUS | Status: DC | PRN
Start: 2020-12-17 — End: 2020-12-17

## 2020-12-17 MED ORDER — MEPERIDINE HCL 25 MG/ML IJ SOLN
25 MG/ML | INTRAMUSCULAR | Status: DC | PRN
Start: 2020-12-17 — End: 2020-12-17

## 2020-12-17 MED ORDER — OXYCODONE HCL 5 MG PO TABS
5 MG | ORAL | Status: DC | PRN
Start: 2020-12-17 — End: 2020-12-19
  Administered 2020-12-19: 10:00:00 5 mg via ORAL

## 2020-12-17 MED ORDER — PROMETHAZINE HCL 25 MG/ML IJ SOLN
25 MG/ML | Freq: Once | INTRAMUSCULAR | Status: DC | PRN
Start: 2020-12-17 — End: 2020-12-17

## 2020-12-17 MED ORDER — ENOXAPARIN SODIUM 40 MG/0.4ML SC SOLN
40 MG/0.4ML | Freq: Every day | SUBCUTANEOUS | Status: DC
Start: 2020-12-17 — End: 2020-12-19
  Administered 2020-12-18 – 2020-12-19 (×3): via SUBCUTANEOUS

## 2020-12-17 MED ORDER — PROPOFOL 200 MG/20ML IV EMUL
200 | INTRAVENOUS | Status: AC
Start: 2020-12-17 — End: 2020-12-17

## 2020-12-17 MED ORDER — DEXAMETHASONE SOD PHOSPHATE PF 10 MG/ML IJ SOLN
10 | INTRAMUSCULAR | Status: AC
Start: 2020-12-17 — End: 2020-12-17

## 2020-12-17 MED ORDER — OXYCODONE HCL 5 MG PO TABS
5 MG | ORAL | Status: DC | PRN
Start: 2020-12-17 — End: 2020-12-17

## 2020-12-17 MED ORDER — OXYCODONE-ACETAMINOPHEN 5-325 MG PO TABS
5-325 MG | ORAL_TABLET | Freq: Four times a day (QID) | ORAL | 0 refills | Status: AC | PRN
Start: 2020-12-17 — End: 2020-12-24

## 2020-12-17 MED ORDER — MORPHINE SULFATE (PF) 4 MG/ML IJ SOLN
4 MG/ML | INTRAMUSCULAR | Status: DC | PRN
Start: 2020-12-17 — End: 2020-12-19
  Administered 2020-12-17 – 2020-12-18 (×2): 4 mg via INTRAVENOUS

## 2020-12-17 MED ORDER — MIDAZOLAM HCL 2 MG/2ML IJ SOLN
2 | INTRAMUSCULAR | Status: AC
Start: 2020-12-17 — End: 2020-12-17

## 2020-12-17 MED ORDER — DIPHENHYDRAMINE HCL 50 MG/ML IJ SOLN
50 MG/ML | Freq: Once | INTRAMUSCULAR | Status: DC | PRN
Start: 2020-12-17 — End: 2020-12-17

## 2020-12-17 MED ORDER — ONDANSETRON HCL 4 MG/2ML IJ SOLN
4 MG/2ML | Freq: Four times a day (QID) | INTRAMUSCULAR | Status: DC | PRN
Start: 2020-12-17 — End: 2020-12-19
  Administered 2020-12-19 (×2): via INTRAVENOUS

## 2020-12-17 MED ORDER — DEXMEDETOMIDINE HCL 200 MCG/2ML IV SOLN
200 | INTRAVENOUS | Status: AC
Start: 2020-12-17 — End: 2020-12-17

## 2020-12-17 MED ORDER — LABETALOL HCL 5 MG/ML IV SOLN
5 MG/ML | INTRAVENOUS | Status: DC | PRN
Start: 2020-12-17 — End: 2020-12-17

## 2020-12-17 MED ORDER — NORMAL SALINE FLUSH 0.9 % IV SOLN
0.9 | Freq: Two times a day (BID) | INTRAVENOUS | Status: DC
Start: 2020-12-17 — End: 2020-12-19
  Administered 2020-12-18 – 2020-12-19 (×2): via INTRAVENOUS

## 2020-12-17 MED ORDER — CLINDAMYCIN PHOSPHATE IN D5W 900 MG/50ML IV SOLN
90050 MG/50ML | INTRAVENOUS | Status: AC
Start: 2020-12-17 — End: 2020-12-17
  Administered 2020-12-17: 13:00:00

## 2020-12-17 MED ORDER — ACETAMINOPHEN 500 MG PO TABS
500 MG | Freq: Three times a day (TID) | ORAL | Status: DC
Start: 2020-12-17 — End: 2020-12-19
  Administered 2020-12-17 – 2020-12-19 (×7): via ORAL

## 2020-12-17 MED ORDER — CEFAZOLIN SODIUM-DEXTROSE 2-4 GM/100ML-% IV SOLN
2-4100- GM/100ML-% | INTRAVENOUS | Status: DC
Start: 2020-12-17 — End: 2020-12-17

## 2020-12-17 MED ORDER — SODIUM CHLORIDE 0.9 % IV BOLUS
0.9 % | Freq: Once | INTRAVENOUS | Status: DC | PRN
Start: 2020-12-17 — End: 2020-12-17

## 2020-12-17 MED ORDER — FAMOTIDINE 20 MG PO TABS
20 MG | Freq: Two times a day (BID) | ORAL | Status: DC
Start: 2020-12-17 — End: 2020-12-19
  Administered 2020-12-18 – 2020-12-19 (×4): via ORAL

## 2020-12-17 MED ORDER — NORMAL SALINE FLUSH 0.9 % IV SOLN
0.9 % | INTRAVENOUS | Status: DC | PRN
Start: 2020-12-17 — End: 2020-12-17

## 2020-12-17 MED ORDER — ONDANSETRON HCL 4 MG/2ML IJ SOLN
4 | INTRAMUSCULAR | Status: AC
Start: 2020-12-17 — End: 2020-12-17

## 2020-12-17 MED ORDER — FAMOTIDINE 20 MG/2ML IV SOLN
202 MG/2ML | Freq: Two times a day (BID) | INTRAVENOUS | Status: DC
Start: 2020-12-17 — End: 2020-12-19

## 2020-12-17 MED ORDER — OXYCODONE HCL 5 MG PO TABS
5 MG | ORAL | Status: DC | PRN
Start: 2020-12-17 — End: 2020-12-19
  Administered 2020-12-17 – 2020-12-19 (×5): 10 mg via ORAL

## 2020-12-17 MED ORDER — ESMOLOL HCL 100 MG/10ML IV SOLN
100 | INTRAVENOUS | Status: AC
Start: 2020-12-17 — End: 2020-12-17

## 2020-12-17 MED ORDER — KETAMINE HCL 50 MG/5ML IJ SOSY
50 | INTRAMUSCULAR | Status: AC
Start: 2020-12-17 — End: 2020-12-17

## 2020-12-17 MED ORDER — LIDOCAINE HCL (PF) 1 % IJ SOLN
1 % | Freq: Once | INTRAMUSCULAR | Status: DC | PRN
Start: 2020-12-17 — End: 2020-12-17

## 2020-12-17 MED ORDER — FAMOTIDINE 20 MG PO TABS
20 MG | Freq: Once | ORAL | Status: AC
Start: 2020-12-17 — End: 2020-12-17
  Administered 2020-12-17: 12:00:00 via ORAL

## 2020-12-17 MED ORDER — ONDANSETRON HCL 4 MG/2ML IJ SOLN
4 MG/2ML | Freq: Once | INTRAMUSCULAR | Status: DC | PRN
Start: 2020-12-17 — End: 2020-12-17

## 2020-12-17 MED ORDER — NORMAL SALINE FLUSH 0.9 % IV SOLN
0.9 % | Freq: Two times a day (BID) | INTRAVENOUS | Status: DC
Start: 2020-12-17 — End: 2020-12-17

## 2020-12-17 MED ORDER — HYDROMORPHONE HCL 1 MG/ML IJ SOLN
1 MG/ML | INTRAMUSCULAR | Status: DC | PRN
Start: 2020-12-17 — End: 2020-12-17
  Administered 2020-12-17 (×2): 0.5 mg via INTRAVENOUS

## 2020-12-17 MED ORDER — ALBUTEROL SULFATE (2.5 MG/3ML) 0.083% IN NEBU
RESPIRATORY_TRACT | Status: DC
Start: 2020-12-17 — End: 2020-12-18
  Administered 2020-12-17 – 2020-12-18 (×3): via RESPIRATORY_TRACT

## 2020-12-17 MED ORDER — LACTATED RINGERS IV SOLN
INTRAVENOUS | Status: DC
Start: 2020-12-17 — End: 2020-12-17
  Administered 2020-12-17: 12:00:00 via INTRAVENOUS

## 2020-12-17 MED ORDER — KCL IN DEXTROSE-NACL 20-5-0.45 MEQ/L-%-% IV SOLN
INTRAVENOUS | Status: DC
Start: 2020-12-17 — End: 2020-12-19
  Administered 2020-12-17 – 2020-12-19 (×3): via INTRAVENOUS

## 2020-12-17 MED ORDER — CELECOXIB 400 MG PO CAPS
400 MG | Freq: Once | ORAL | Status: AC
Start: 2020-12-17 — End: 2020-12-17
  Administered 2020-12-17: 12:00:00 via ORAL

## 2020-12-17 MED ORDER — ACETAMINOPHEN 500 MG PO TABS
500 MG | Freq: Once | ORAL | Status: AC
Start: 2020-12-17 — End: 2020-12-17
  Administered 2020-12-17: 12:00:00 via ORAL

## 2020-12-17 MED ORDER — HEPARIN SODIUM (PORCINE) 5000 UNIT/ML IJ SOLN
5000 UNIT/ML | Freq: Once | INTRAMUSCULAR | Status: AC
Start: 2020-12-17 — End: 2020-12-17
  Administered 2020-12-17: 12:00:00 via SUBCUTANEOUS

## 2020-12-17 MED FILL — ESMOLOL HCL 100 MG/10ML IV SOLN: 100 MG/10ML | INTRAVENOUS | Qty: 10

## 2020-12-17 MED FILL — MAPAP 500 MG PO TABS: 500 mg | ORAL | Qty: 2

## 2020-12-17 MED FILL — MAGNESIUM SULFATE 2 GM/50ML IV SOLN: 2 GM/50ML | INTRAVENOUS | Qty: 50

## 2020-12-17 MED FILL — ROCURONIUM BROMIDE 50 MG/5ML IV SOLN: 50 MG/5ML | INTRAVENOUS | Qty: 5

## 2020-12-17 MED FILL — DEXAMETHASONE SOD PHOSPHATE PF 10 MG/ML IJ SOLN: 10 mg/mL | INTRAMUSCULAR | Qty: 1

## 2020-12-17 MED FILL — DEXMEDETOMIDINE HCL 200 MCG/2ML IV SOLN: 200 MCG/2ML | INTRAVENOUS | Qty: 4

## 2020-12-17 MED FILL — DILAUDID 1 MG/ML IJ SOLN: 1 mg/mL | INTRAMUSCULAR | Qty: 0.5

## 2020-12-17 MED FILL — MORPHINE SULFATE 4 MG/ML IJ SOLN: 4 mg/mL | INTRAMUSCULAR | Qty: 1

## 2020-12-17 MED FILL — HEPARIN SODIUM (PORCINE) 5000 UNIT/ML IJ SOLN: 5000 [IU]/mL | INTRAMUSCULAR | Qty: 1

## 2020-12-17 MED FILL — BRIDION 200 MG/2ML IV SOLN: 200 MG/2ML | INTRAVENOUS | Qty: 2

## 2020-12-17 MED FILL — CELECOXIB 400 MG PO CAPS: 400 mg | ORAL | Qty: 1

## 2020-12-17 MED FILL — PHENYLEPHRINE HCL (PRESSORS) 1 MG/10ML IV SOSY: 1 MG/0ML | INTRAVENOUS | Qty: 10

## 2020-12-17 MED FILL — FAMOTIDINE 20 MG PO TABS: 20 mg | ORAL | Qty: 1

## 2020-12-17 MED FILL — XYLOCAINE-MPF 2 % IJ SOLN: 2 % | INTRAMUSCULAR | Qty: 5

## 2020-12-17 MED FILL — OXYCODONE HCL 5 MG PO TABS: 5 mg | ORAL | Qty: 2

## 2020-12-17 MED FILL — CLEOCIN IN D5W 900 MG/50ML IV SOLN: 900 MG/50ML | INTRAVENOUS | Qty: 50

## 2020-12-17 MED FILL — KETAMINE HCL 50 MG/5ML IJ SOSY: 50 MG/5ML | INTRAMUSCULAR | Qty: 5

## 2020-12-17 MED FILL — KCL IN DEXTROSE-NACL 20-5-0.45 MEQ/L-%-% IV SOLN: 20-5-0.45 MEQ/L-%-% | INTRAVENOUS | Qty: 1000

## 2020-12-17 MED FILL — MIDAZOLAM HCL 2 MG/2ML IJ SOLN: 2 mg/mL | INTRAMUSCULAR | Qty: 2

## 2020-12-17 MED FILL — CEFAZOLIN SODIUM-DEXTROSE 2-4 GM/100ML-% IV SOLN: 2-4 GM/100ML-% | INTRAVENOUS | Qty: 100

## 2020-12-17 MED FILL — ONDANSETRON HCL 4 MG/2ML IJ SOLN: 4 MG/2ML | INTRAMUSCULAR | Qty: 2

## 2020-12-17 MED FILL — DEXAMETH SOD PHOS-BUPIV-EPIN 0.01-0.375 %-1:200000 IJ SOSY: 0.01-0.375 %-1:200000 | INTRAMUSCULAR | Qty: 60

## 2020-12-17 MED FILL — GABAPENTIN 100 MG PO CAPS: 100 mg | ORAL | Qty: 1

## 2020-12-17 MED FILL — DIPRIVAN 200 MG/20ML IV EMUL: 200 MG/20ML | INTRAVENOUS | Qty: 20

## 2020-12-17 NOTE — Care Coordination-Inpatient (Signed)
Perfect served Dr. Craige Cotta re diet and meds. He perfect served back ok per Dr Andres Shad. For diet and po meds.

## 2020-12-17 NOTE — Anesthesia Post-Procedure Evaluation (Signed)
Department of Anesthesiology                             Post Anesthesia Note    Name: [name] MRN: [id number] DOB: [birth year]    (Age-40 y.o.)     This note was authored with review of the notes of the PACU and/or Same day stay and/or direct communication with the patient with an anesthesia care provider.     POST  ANESTHESIA  EVALUATION    Patient Vitals for the past 2 hrs:   BP Temp Temp src Pulse Resp SpO2   12/17/20 1315 135/85 97.1 ??F (36.2 ??C) Temporal 71 18 100 %   12/17/20 1245 117/75 -- -- 64 -- 100 %   12/17/20 1230 114/74 -- -- 66 -- 100 %   12/17/20 1215 110/75 -- -- 67 -- 100 %   12/17/20 1130 120/76 -- -- 67 -- 100 %        BP 135/85    Pulse 71    Temp 97.1 ??F (36.2 ??C) (Temporal)    Resp 18    Ht 5\' 6"  (1.676 m)    Wt 163 lb (73.9 kg)    SpO2 100%    BMI 26.31 kg/m??      Pain Level: 8    Vital signs: Respiratory and Cardiovascular function within normal limits (See Nursing Record)  Level of consciousness: Patient awake/ Able to participate      Return to baseline mental status: Yes Airway status: Normal   Pain controlled: Yes Dental injury: No   Nausea/Vomiting controlled: Yes Complications: no   Well hydrated: Yes      Patient Experience comments: None expressed    Electronically signed by Shenita Trego, APRN - CRNA on 12/17/2020 at 1:27 PM

## 2020-12-17 NOTE — Brief Op Note (Signed)
Brief Postoperative Note    FEDERICA ALLPORT  Date of Birth:  1981-09-22  [id number]    Pre-operative Diagnosis: s/p LRYGB    Post-operative Diagnosis: Same    Procedure: Laparoscopic revision of jejunojejunostomy, small bowel resection, EGD    Anesthesia: General    Surgeons/Assistants: Pozsgay/ Courtny Bennison PGY2    Estimated Blood Loss: less than 50     Complications: None    Specimens: Was Obtained: small bowel    Findings:   See op note    Chyrel Masson, MD  PGY2, General Surgery  Pager [phone]      Electronically signed by Marlou Starks, MD on 12/17/2020 at 10:35 AM

## 2020-12-18 LAB — URINALYSIS
Bilirubin Urine: NEGATIVE mg/dL
Glucose, Ur: NORMAL mg/dL (ref <70)
Ketones, Urine: NEGATIVE mg/dL
LEUKOCYTES, UA: NEGATIVE Leu/uL
Nitrite, Urine: NEGATIVE NA
Occult Blood,Urine: NEGATIVE mg/dL
Specific Gravity, Urine: 1.009 NA (ref 1.005–1.030)
Total Protein, Urine: NEGATIVE mg/dL
Urobilinogen, Urine: NORMAL mg/dL (ref 0–1)
pH, Urine: 5 NA (ref 5.0–8.0)

## 2020-12-18 LAB — CBC
Hematocrit: 32.5 % — ABNORMAL LOW (ref 35.0–47.0)
Hemoglobin: 10.3 g/dL — ABNORMAL LOW (ref 11.7–16.0)
MCH: 27 pg (ref 26.0–34.0)
MCHC: 31.6 % — ABNORMAL LOW (ref 32.0–36.0)
MCV: 85.6 fL (ref 79.0–98.0)
MPV: 8.9 fL (ref 7.4–10.4)
Platelets: 356 10*3/uL (ref 140–440)
RBC: 3.8 10*6/uL (ref 3.80–5.20)
RDW: 13.9 % (ref 11.5–14.5)
WBC: 16.5 10*3/uL — ABNORMAL HIGH (ref 3.6–10.7)

## 2020-12-18 LAB — BASIC METABOLIC PANEL
Anion Gap: 10 mmol/L (ref 3–13)
BUN: 11 mg/dL (ref 9–20)
CO2: 20 mmol/L — ABNORMAL LOW (ref 22–30)
Calcium: 9 mg/dL (ref 8.4–10.4)
Chloride: 104 mmol/L (ref 98–107)
Creatinine: 0.98 mg/dL (ref 0.52–1.25)
EGFR IF NonAfrican American: 72.4 mL/min (ref >60)
Glucose: 128 mg/dL — ABNORMAL HIGH (ref 70–100)
Potassium: 4.1 mmol/L (ref 3.5–5.1)
Sodium: 134 mmol/L — ABNORMAL LOW (ref 135–145)
eGFR African American: 83.9 mL/min (ref >60)

## 2020-12-18 LAB — TROPONIN: Troponin I: 0.016 ng/mL (ref 0.000–0.034)

## 2020-12-18 LAB — ADD ON LAB TEST

## 2020-12-18 MED ORDER — ALBUTEROL SULFATE (2.5 MG/3ML) 0.083% IN NEBU
Freq: Four times a day (QID) | RESPIRATORY_TRACT | Status: DC | PRN
Start: 2020-12-18 — End: 2020-12-19

## 2020-12-18 MED ORDER — GABAPENTIN 100 MG PO CAPS
100 MG | Freq: Every day | ORAL | Status: DC
Start: 2020-12-18 — End: 2020-12-19
  Administered 2020-12-18 – 2020-12-19 (×2): via ORAL

## 2020-12-18 MED FILL — MAPAP 500 MG PO TABS: 500 mg | ORAL | Qty: 2

## 2020-12-18 MED FILL — FAMOTIDINE 20 MG PO TABS: 20 mg | ORAL | Qty: 1

## 2020-12-18 MED FILL — MORPHINE SULFATE 4 MG/ML IJ SOLN: 4 mg/mL | INTRAMUSCULAR | Qty: 1

## 2020-12-18 MED FILL — ENOXAPARIN SODIUM 40 MG/0.4ML SC SOLN: 40 MG/0.4ML | SUBCUTANEOUS | Qty: 0.4

## 2020-12-18 MED FILL — KCL IN DEXTROSE-NACL 20-5-0.45 MEQ/L-%-% IV SOLN: 20-5-0.45 MEQ/L-%-% | INTRAVENOUS | Qty: 1000

## 2020-12-18 MED FILL — OXYCODONE HCL 5 MG PO TABS: 5 mg | ORAL | Qty: 2

## 2020-12-18 MED FILL — GABAPENTIN 100 MG PO CAPS: 100 mg | ORAL | Qty: 1

## 2020-12-18 NOTE — Progress Notes (Signed)
Bariatric RN Case Manager Note - POD #1 - AM    Patient s/p JJ revision    UGI done  Extravasation: No  Delayed gastric emptying: No  Begin Bariatric Clear Liquid Diet: Yes   Diet protocol reviewed with patient: Yes-initiated yesterday, patient denies any questions, no N?V     height is 5\' 6"  (1.676 m) and weight is 163 lb (73.9 kg). Her temporal temperature is 98.5 ??F (36.9 ??C). Her blood pressure is 119/71 and her pulse is 83. Her respiration is 16 and oxygen saturation is 99%.     Total output last 24 hours: In: 650   Out: 615 [Urine:600]    Abdomen soft, nontender  Bowel sounds:hypoactive    Laparoscopy incisions: clean, dry and intact  Steri-strips intact: Yes  LLQ incision packed:  No      Instructed patient to ambulate and use incentive spirometer.  SCDs to bilateral lower extremities on while in bed.  Monitor tolerance to liquids    Pain Control:  Current treatment: oral pain medication tolerated-oxycodone    Home medications reviewed    Interventions: Yes-chest xray and xray ordered for patients complaints of chest pain, will follow    Electronically signed by , RN on 12/18/2020 at 8:00 AM

## 2020-12-18 NOTE — Progress Notes (Signed)
[facility] - Surgery  Bariatric Care Center  Patient Name: [name]  Date: 12/18/20    MIS-General Surgery/Bariatric Progress Note      Subjective:     The patient is doing well, postoperative day #1 from Revision of Jejunojejunostomy.  No nausea, vomiting overnight. Does endorse some chest pain.     Scheduled Meds:  ??? sodium chloride flush  10 mL IntraVENous 2 times per day   ??? famotidine  20 mg Oral BID    Or   ??? famotidine (PEPCID) injection  20 mg IntraVENous BID   ??? enoxaparin  40 mg SubCUTAneous Daily   ??? albuterol  2.5 mg Nebulization Q4H WA   ??? acetaminophen  1,000 mg Oral 3 times per day     Continuous Infusions:  ??? dextrose 5% and 0.45% NaCl with KCl 20 mEq 75 mL/hr at 12/18/20 0146   ??? sodium chloride       PRN Meds:sodium chloride flush, sodium chloride, morphine **OR** morphine, ondansetron, oxyCODONE **OR** oxyCODONE    Allergies   Allergen Reactions   ??? Amoxicillin Swelling   ??? Pcn [Penicillins] Swelling   ??? Zoloft [Sertraline Hcl] Itching       Objective:     Patient Vitals for the past 24 hrs:   BP Temp Temp src Pulse Resp SpO2   12/18/20 0559 119/71 98.5 ??F (36.9 ??C) Temporal 83 -- 99 %   12/18/20 0123 117/78 98.5 ??F (36.9 ??C) Temporal 86 16 98 %   12/17/20 2133 -- -- -- -- 16 98 %   12/17/20 2131 119/78 98.1 ??F (36.7 ??C) Temporal 83 18 100 %   12/17/20 1727 137/80 97.7 ??F (36.5 ??C) Temporal 97 18 100 %   12/17/20 1553 -- -- -- -- -- 100 %   12/17/20 1315 135/85 97.1 ??F (36.2 ??C) Temporal 71 18 100 %   12/17/20 1245 117/75 -- -- 64 -- 100 %   12/17/20 1230 114/74 -- -- 66 -- 100 %   12/17/20 1215 110/75 -- -- 67 -- 100 %   12/17/20 1130 120/76 -- -- 67 -- 100 %   12/17/20 1120 -- -- -- -- -- 100 %   12/17/20 1115 122/81 -- -- 69 -- 100 %   12/17/20 1100 121/85 -- -- 79 -- 100 %   12/17/20 1045 124/81 -- -- 74 -- 100 %   12/17/20 1030 118/71 -- -- 70 -- 100 %   12/17/20 1029 122/77 97.3 ??F (36.3 ??C) Temporal 69 16 --       Average, Min, and Max for last 24 hours  Vitals:  TEMPERATURE:  Temp  Avg: 97.9 ??F (36.6 ??C)  Min: 97.1 ??F (36.2 ??C)  Max: 98.5 ??F (36.9 ??C)    RESPIRATIONS RANGE: Resp  Avg: 17  Min: 16  Max: 18    PULSE RANGE: Pulse  Avg: 74.6  Min: 64  Max: 97    BLOOD PRESSURE RANGE:  Systolic (24hrs), Avg:121 , Min:110 , Max:137   ; Diastolic (24hrs), Avg:78, Min:71, Max:85      PULSE OXIMETRY RANGE: SpO2  Avg: 99.7 %  Min: 98 %  Max: 100 %    I/O last 3 completed shifts:  In: 650 [I.V.:650]  Out: 15 [Blood:15]    CBC:   Recent Labs     12/17/20  1050 12/18/20  0146   WBC  --  16.5*   HGB 12.1 10.3*   HCT 38.1 32.5*   PLT  --  356     BMP:    Recent Labs     12/17/20  1050 12/18/20  0146   NA 137 134*   K 4.3 4.1   CL 104 104   CO2 24 20*   BUN 9 11   CREATININE 1.32* 0.98   GLUCOSE 148* 128*       Abdomen:  Bowel sounds were normal.  The abdomen was soft and appropriately tender postoperative.  Nondistended.      Wounds are clean dry and intact.    Assessment/Plan:   1. Postoperative day #1, JJ revision  2. GI/DVT prophylaxis, continue SQ heparin  3. UGI pending  4. Increase activity  5. Trial gastric bypass clear liquid diet pending UGI  6. Pulmonary toilet  7. Initiate home medications, and oral pain control medications     Electronically signed by Maurine Cane, MD on 12/18/20 at 6:53 AM EST

## 2020-12-18 NOTE — Telephone Encounter (Signed)
Signed.

## 2020-12-18 NOTE — Progress Notes (Signed)
Bariatric RN Case Manager Note POD #1 - PM    Patient s/p JJ revision    Patient doing well   Tolerating liquids: Yes  Nausea/vomiting: No  Dysphagia: No    Adequate Urine Output: Yes    Incisions: clean, dry and intact  Steri-strips intact: Yes  .     Bowel movement: No  If yes - description:     Patient on room air: Yes    Ambulating:Yes  Using incentive spirometer: Yes    Pain Control:  Current treatment: patient defers oxycodone, states only moderate relief with tylenol. Ice packs using  Interventions: No    Continue to monitor      Electronically signed by Orvan July, RN on 12/18/2020 at 1:14 PM

## 2020-12-18 NOTE — Plan of Care (Signed)
Problem: Pain:  Goal: Pain level will decrease  Description: Pain level will decrease  Outcome: Ongoing  Goal: Control of acute pain  Description: Control of acute pain  Outcome: Ongoing  Goal: Control of chronic pain  Description: Control of chronic pain  Outcome: Ongoing     Problem: Pain:  Goal: Pain level will decrease  Description: Pain level will decrease  Outcome: Ongoing

## 2020-12-18 NOTE — Progress Notes (Signed)
Patient seen and examined, no major issues overnight however today she has some chest discomfort.  We will attempt to advance diet, check chest x-ray and EKG, increase ambulation, observation today to ensure chest pain resolves

## 2020-12-18 NOTE — Progress Notes (Signed)
Preliminary negative

## 2020-12-18 NOTE — Discharge Instructions (Signed)
[facility] DIETITIAN DISCHARGE INSTRUCTIONS    The following information was reviewed with the patient, and the patient was given a hard copy of these instructions by the Bariatric Registered Dietitian.    Overview of Post-Op Diet Protocol for Patients Following Gastric Bypass or Sleeve Gastrectomy    Your dietitian will meet with you in the hospital before you are discharged to review the diet in more details and to answer any questions you may have,      Day of Surgery (after you wake up from surgery)    Nothing to eat or drink    Post-op Day 1    If you are having an UGI xray, you will begin your clear liquids after you are notified that the results are back and you are cleared to begin    If you are NOT having an UGI xray, you will begin your clear liquids as soon as your nurse notifies you that it is OK to start.    Post-Op Day 2    Continue your bariatric clear liquid diet    Post-op Day 3    Begin full liquid diet and continue until after your 1 week post-op visit      Full Liquid DietGeneral Guidelines:  1. Divide food into three (3) small meals and three (3) small snacks.  2. Eat the food  Very slowly, using smaller size utensils. If you feel full, STOP eating.  3. Drink liquids 30 minutes before or 30 minutes after meals and snacks.  4.Do not use a straw.  5. Start tracking your protein intake - 65-75 grams daily should be your total.    Foods Allowed  In addition to all foods permitted on the clear liequid diet, you may have:  1.Diluted fruit juices that are pulp-free.  2. Low-fat, strained cream soups.  3. Sugar-free strained creamed soups  4. Sugar-free pudding with protein powder  5. Sugar substitutes  6. Thin cooked cereals  7. Low-sugar yogurt without fruit  8. Skim milk or 1% milk  9. Lactaid, soy or almond milk  10. Low-fat or nonfat cottage cheese )mashed with a fork and then chewed thoroughly before swallowing)  11. Protein powder  12. High protein, low-sugar beverages and shakes (see  list in Patient Education Manual)    You will continue to follow the full liquid diet until after you meet with your dietitian during your 1 week post-op office visit. The next diet phases will be reviewed and discussed with your during that visit,    If you have questions about your diet(s), please contact the Bariatric Dietitians at [phone].    Reviewed by   Betsy Coder, RD, LD          [facility] - Surgery  [facility]  Patient Name: [name]  Date: @TODAY @      DISCHARGE INSTRUCTIONS       Thank you very much for allowing me to participate in your care, it is truly a privilege. Below please see discharge orders that will help you during your recovery. Please do not hesitate to call the office during the day at [phone] for any questions. After hours, the same number will allow you to reach the on-call surgeon.    Please remove the Steri-Strips 5 days after surgery.  You may be instructed by nursing staff to either "leave them on until you see Dr. [phone]" or "they will fall off on their own."  Neither is  true.  Please remove the Steri-Strips as instructed 5 days after your date of surgery.  This includes any clear bandages and gauze placed in the navel, if applicable.      If you have been provided with an abdominal binder, this is for your comfort.  Please take this off in order to shower and use it as needed for your comfort.  There is no designated time frame with which you should wear the binder.  Again, it is only for your comfort and symptom relief.      Please shower the day after surgery.  Soap and water is adequate.  Keep the incisions clean and dry.  No lotions or ointments until you are seen in the office.    Surgery can hurt! Please make every effort to take the pain medicine as instructed to help reduce your discomfort. I usually recommend that you take pain medicine when you wake up in the morning and before you go to sleep.  Schedule the rest of the day  in order to not interfere with medication dosages as prescribed.  If you feel that the medicine is not working, please call the office.     Should you have nausea after surgery (the most common complaint after surgery) you will be provided with a prescription for Phenergan.  Please utilize this should you have any postoperative nausea or vomiting. If you feel that the medicine is not working, please call the office.     For any emergencies, please dial 911.    Thank you again for allowing me to participate in your care, and get well soon!

## 2020-12-18 NOTE — Progress Notes (Signed)
Patient Evaluation Form    The patient is currently receiving Albuterol Q4 while awake    Points 0 1 2 3 4  Points Totals    Pulmonary Status  (-/+) History   Smoking history < 20 pack years Smoking history > 20 pack years Pulmonary Disorder (acute or chronic) Severe or Chronic with Exacerbation 0   Surgical Status No Surgery  Trach  PEG General Surgery Lower Abdominal Thoracic or Upper Abdominal Thoracic with Pulmonary Disorder 2   Chest X-ray Clear  None Ordered Chronic Changes  CXR results Pending Infiltrates, atelectasis, pleural effusion, or edema Infiltrates in more than one lobe Infiltrate + Atelectasis, &/or pleural effusion 0   Respiratory Pattern Regular,  RR = 12-20 Increased,  RR = 21-25 DOE, irregular, or RR = 26-30 Decreased FEV1 or RR = 31-35 Severe SOB, used of of accessory muscles, or RR = > 35 0   Mental Status Alert, oriented, cooperative Confused, but follows commands Lethargic or un-able to follow commands Obtunded Comatose 0   Breath Sounds Clear to auscultation Decreased unilaterally or in bases only Decreased bilaterally Crackles or intermittent wheezes Wheezes  1   Cough Strong, spontaneous, & nonproductive Strong, spontaneous, & productive Weak, nonproductive Weak, productive or with wheezes No spontaneous cough or may require suctioning  0   Level of Activity Ambulatory Ambulatory with Assist Non-ambulatroy Paraplegic Quadriplegic 1   Triage 1  > 20 pts Triage 2  16-20 pts Triage 3  11- 15 pts Triage 4  6 - 10 pts Triage 5  0 - 5 pts TOTAL POINTS = 4          Triage Score = 5      Changing Therapy to Albuterol PRN     Electronically signed by , RCP on 12/18/2020 at 1:04 PM

## 2020-12-19 LAB — URINALYSIS
Bilirubin Urine: NEGATIVE mg/dL
Glucose, Ur: NORMAL mg/dL (ref <70)
Ketones, Urine: NEGATIVE mg/dL
LEUKOCYTES, UA: NEGATIVE Leu/uL
Nitrite, Urine: NEGATIVE NA
Occult Blood,Urine: NEGATIVE mg/dL
Specific Gravity, Urine: 1.017 NA (ref 1.005–1.030)
Total Protein, Urine: NEGATIVE mg/dL
Urobilinogen, Urine: NORMAL mg/dL (ref 0–1)
pH, Urine: 5.5 NA (ref 5.0–8.0)

## 2020-12-19 LAB — CBC
Hematocrit: 29.1 % — ABNORMAL LOW (ref 35.0–47.0)
Hemoglobin: 9.3 g/dL — ABNORMAL LOW (ref 11.7–16.0)
MCH: 27.3 pg (ref 26.0–34.0)
MCHC: 32 % (ref 32.0–36.0)
MCV: 85.4 fL (ref 79.0–98.0)
MPV: 8.5 fL (ref 7.4–10.4)
Platelets: 287 10*3/uL (ref 140–440)
RBC: 3.41 10*6/uL — ABNORMAL LOW (ref 3.80–5.20)
RDW: 14.2 % (ref 11.5–14.5)
WBC: 5.9 10*3/uL (ref 3.6–10.7)

## 2020-12-19 LAB — BASIC METABOLIC PANEL
Anion Gap: 4 mmol/L (ref 3–13)
BUN: 8 mg/dL — ABNORMAL LOW (ref 9–20)
CO2: 23 mmol/L (ref 22–30)
Calcium: 8.6 mg/dL (ref 8.4–10.4)
Chloride: 107 mmol/L (ref 98–107)
Creatinine: 0.97 mg/dL (ref 0.52–1.25)
EGFR IF NonAfrican American: 73.3 mL/min (ref >60)
Glucose: 97 mg/dL (ref 70–100)
Potassium: 4.3 mmol/L (ref 3.5–5.1)
Sodium: 134 mmol/L — ABNORMAL LOW (ref 135–145)
eGFR African American: 84.9 mL/min (ref >60)

## 2020-12-19 MED ORDER — ALBUTEROL SULFATE (5 MG/ML) 0.5% IN NEBU
Freq: Three times a day (TID) | RESPIRATORY_TRACT | 1 refills | Status: DC | PRN
Start: 2020-12-19 — End: 2021-01-29

## 2020-12-19 MED ORDER — POLYETHYLENE GLYCOL 3350 17 G PO PACK
17 g | Freq: Every day | ORAL | Status: DC
Start: 2020-12-19 — End: 2020-12-19
  Administered 2020-12-19: 13:00:00 via ORAL

## 2020-12-19 MED ORDER — METHOCARBAMOL 750 MG PO TABS
750 MG | Freq: Three times a day (TID) | ORAL | Status: DC
Start: 2020-12-19 — End: 2020-12-19
  Administered 2020-12-19 (×2): via ORAL

## 2020-12-19 MED FILL — FAMOTIDINE 20 MG PO TABS: 20 mg | ORAL | Qty: 1

## 2020-12-19 MED FILL — OXYCODONE HCL 5 MG PO TABS: 5 mg | ORAL | Qty: 1

## 2020-12-19 MED FILL — METHOCARBAMOL 750 MG PO TABS: 750 mg | ORAL | Qty: 1

## 2020-12-19 MED FILL — MAPAP 500 MG PO TABS: 500 mg | ORAL | Qty: 2

## 2020-12-19 MED FILL — FAMOTIDINE 20 MG/2ML IV SOLN: 20 MG/2ML | INTRAVENOUS | Qty: 2

## 2020-12-19 MED FILL — GABAPENTIN 100 MG PO CAPS: 100 mg | ORAL | Qty: 1

## 2020-12-19 MED FILL — OXYCODONE HCL 5 MG PO TABS: 5 mg | ORAL | Qty: 2

## 2020-12-19 MED FILL — PEG 3350 17 G PO PACK: 17 g | ORAL | Qty: 1

## 2020-12-19 MED FILL — ONDANSETRON HCL 4 MG/2ML IJ SOLN: 4 MG/2ML | INTRAMUSCULAR | Qty: 2

## 2020-12-19 MED FILL — ENOXAPARIN SODIUM 40 MG/0.4ML SC SOLN: 40 MG/0.4ML | SUBCUTANEOUS | Qty: 0.4

## 2020-12-19 MED FILL — KCL IN DEXTROSE-NACL 20-5-0.45 MEQ/L-%-% IV SOLN: 20-5-0.45 MEQ/L-%-% | INTRAVENOUS | Qty: 1000

## 2020-12-19 NOTE — Progress Notes (Signed)
IV taken out, Discharge paperwork gone over with pt who verbalizes understanding.  Meds to beds delivered medications.  Pt is waiting on her ride.

## 2020-12-19 NOTE — Progress Notes (Signed)
[facility] - Surgery  [facility] Care Center  Patient Name: [name]  Date: 12/18/20    MIS-General Surgery/Bariatric Progress Note      Subjective:     The patient is doing well, postoperative day #2 from Revision of Jejunojejunostomy.  Mild nausea, no emesis. Passing flatus, no BM yet. Endorses dysuria and urgency.    Scheduled Meds:  ??? methocarbamol  750 mg Oral TID   ??? polyethylene glycol  17 g Oral Daily   ??? gabapentin  100 mg Oral Daily   ??? sodium chloride flush  10 mL IntraVENous 2 times per day   ??? famotidine  20 mg Oral BID    Or   ??? famotidine (PEPCID) injection  20 mg IntraVENous BID   ??? enoxaparin  40 mg SubCUTAneous Daily   ??? acetaminophen  1,000 mg Oral 3 times per day     Continuous Infusions:  ??? dextrose 5% and 0.45% NaCl with KCl 20 mEq 75 mL/hr at 12/19/20 0204   ??? sodium chloride       PRN Meds:albuterol, sodium chloride flush, sodium chloride, morphine **OR** morphine, ondansetron, oxyCODONE **OR** oxyCODONE    Allergies   Allergen Reactions   ??? Amoxicillin Swelling   ??? Pcn [Penicillins] Swelling   ??? Zoloft [Sertraline Hcl] Itching       Objective:     Patient Vitals for the past 24 hrs:   BP Temp Temp src Pulse Resp SpO2   12/19/20 0506 123/79 98.7 ??F (37.1 ??C) Temporal 84 18 98 %   12/19/20 0144 115/74 98.7 ??F (37.1 ??C) Temporal 87 16 99 %   12/18/20 2101 107/78 98.5 ??F (36.9 ??C) Temporal 88 16 100 %   12/18/20 1602 113/67 98.7 ??F (37.1 ??C) Temporal 91 18 99 %   12/18/20 1249 117/78 97.7 ??F (36.5 ??C) Temporal 92 18 97 %   12/18/20 0953 -- -- -- -- 16 99 %   12/18/20 0811 109/72 98.1 ??F (36.7 ??C) Temporal 79 18 100 %       Average, Min, and Max for last 24 hours Vitals:  TEMPERATURE:  Temp  Avg: 98.4 ??F (36.9 ??C)  Min: 97.7 ??F (36.5 ??C)  Max: 98.7 ??F (37.1 ??C)    RESPIRATIONS RANGE: Resp  Avg: 17.1  Min: 16  Max: 18    PULSE RANGE: Pulse  Avg: 86.8  Min: 79  Max: 92    BLOOD PRESSURE RANGE:  Systolic (24hrs), Avg:114 , Min:107 , Max:123   ; Diastolic (24hrs), Avg:75,  Min:67, Max:79      PULSE OXIMETRY RANGE: SpO2  Avg: 98.9 %  Min: 97 %  Max: 100 %    I/O last 3 completed shifts:  In: -   Out: 600 [Urine:600]    CBC:   Recent Labs     12/17/20  1050 12/18/20  0146 12/19/20  0146   WBC  --  16.5* 5.9   HGB 12.1 10.3* 9.3*   HCT 38.1 32.5* 29.1*   PLT  --  356 287     BMP:    Recent Labs     12/17/20  1050 12/18/20  0146 12/19/20  0146   NA 137 134* 134*   K 4.3 4.1 4.3   CL 104 104 107   CO2 24 20* 23   BUN 9 11 8*   CREATININE 1.32* 0.98 0.97   GLUCOSE 148* 128* 97       Abdomen:  Bowel sounds were  normal.  The abdomen was soft and appropriately tender postoperative.  Nondistended.      Wounds are clean dry and intact.    Assessment/Plan:   1. Postoperative day #2, JJ revision  2. GI/DVT prophylaxis, continue SQ heparin  3. UGI negative, tolerating Bari CLD, will consider advancing to FLD today  4. Increase activity  5. PO robaxin ordered  6. UA for dysuria, bladder scan  7. Pulmonary toilet  8. Initiate home medications, and oral pain control medications     [facility] Dr Andres Shad    Chyrel Masson, MD  PGY2, General Surgery  Pager [phone]    Electronically signed by Maurine Cane, MD on 12/18/20 at 6:53 AM EST

## 2020-12-19 NOTE — Other (Unsigned)
Patient Acct Nbr: [id number]   Primary AUTH/CERT:   Primary Insurance Company Name: [employer] Plan name: [employer] [facility] HMO PPO  Primary Insurance Group Number: [id number]  Primary Insurance Plan Type: Health  Primary Insurance Policy Number: [id number]    Secondary AUTH/CERT:   Secondary Insurance Company Name: [employer] Plan name: [facility]  Secondary Insurance Group Number: [id number]  Secondary Insurance Plan Type: Health  Secondary Insurance Policy Number: [id number]

## 2020-12-19 NOTE — Op Note (Signed)
PATIENTRACHELLE, [name]  MEDICAL RECORD #:         [id number]  ADMISSION DATE:           12/18/2020  SURGERY DATE:             12/17/2020  ACCOUNT #:                [id number]  DATE OF BIRTH:            [birth year]  AGE:                      39  ADMITTING PHYSICIAN:      Clarene Essex, DO  ATTENDING PHYSICIAN:      Clarene Essex, DO  DICTATING PHYSICIAN:      Clarene Essex, DO                               OPERATIVE RECORD      Procedure:  LAPAROSCOPIC REVISION OF JEJUNOJEJUNOSTOMY WITH  INTRAOPERATIVE EGD.    Preoperative Diagnoses:  1.    Abdominal pain.  2.    Dilation of jejunojejunostomy.    Postoperative Diagnoses:  1.    Abdominal pain.  2.    Dilation of jejunojejunostomy.    Anesthesia:  General.    Assistant:  Resident.    Complications:  None.    Estimated Blood Loss:  Minimal.    Details:  The patient is a very pleasant 40 year old female, who had  developed left mid abdominal pain.  This has been getting worse, she  had a CT scan of the abdomen and pelvis, which did not reveal any  obvious source of pain; however, the patient did have a dilated  jejunojejunostomy, which was concerning for possible rotation and  possible obstruction.  The patient was consented for diagnostic  laparoscopy with revision of jejunojejunostomy.    Description of Procedure:  She was brought to the operative suite and  placed in supine position.  General endotracheal anesthesia was  induced.  Arms were at her sides being careful to protect bony  prominences.  We began the operation after appropriate time-out and  prep and drape by making a left lower quadrant incision.  Veress  needle was inserted without difficulty.  The opening pressure was 3 mm  pneumoperitoneum.  Optical trocar was then placed followed by a  supraumbilical 12 right-sided and a left-sided 12 and infraumbilical  12 mm trocar.  We took a general survey of the abdominal cavity.  The  Roux limb appeared normal.  There was no twisting.  There  was no  evidence of Petersen's defect, all these defects were closed.  We  evaluated the distal small intestine, which otherwise was normal.  There was no evidence of any twisting or significant change.  As we  evaluated the jejunojejunostomy, there did appear to be some chronic  inflammatory change at the base of the mesentery consistent with  chronic twisting.  At this point, we transected the 3 limbs, the Roux  limb, the biliopancreatic limb, as well as the common channel.  We  dissected down to the mesentery where the specimen was then removed  from the patient's abdomen.  We did Clete Kuch the biliopancreatic limb to  ensure there were no areas made in terms of reanastomosis.  We  performed a side-to-side triple stapled anastomosis from the Roux limb  to the common channel to re-establish continuity of the bowel and then  performed a side-to-side triple stapled jejunojejunostomy using white  load Endo-GIA staple loads approximately 20 cm distal to the Roux  limb.  The clips were also then placed at the anastomosis.  We also  then closed the mesenteric defect using Ethibond.  At this point, we  then once again ran the small intestine from proximal distal, there  were no other abnormalities.    The scope at that point was then placed down the patient's mouth,  advanced down the esophagus into the small gastric pouch.  There were  no ulcerations noted.  The anastomosis was noted to be widely patent.  The scope was then removed.  We then removed the trocars from the  patient's abdomen and closed the infraumbilical as well as left-sided  trocar using interrupted 0-Vicryl sutures.  We closed incisions using  4-0 Monocryl.  Steri-Strips were applied.  The patient was then  extubated and taken to the recovery in stable condition.    Diskriter Job ID: [id number]        Clarene Essex, DO    DOD:12/31/2020 02:45 P  MAP/dsk  DOT:12/31/2020 03:34 P  Job Number: [id number]  Document Number: [id number]  cc:   Clarene Essex, DO        [facility]        [address] [place] [address]

## 2020-12-19 NOTE — Progress Notes (Signed)
Nursing assistant wheeled pt to d/c in wheelchair.

## 2020-12-19 NOTE — Care Coordination-Inpatient (Signed)
Discharge Planning  Current Residence: Private Residence  Living Arrangements: Children  Support Systems: Children  Current Services Prior To Admission: None  Potential Assistance Needed: N/A  Potential Assistance Purchasing Medications: No  Meds-to-Beds: Does the patient want to have any new prescriptions delivered to bedside prior to discharge?: Yes  Type of Home Care Services: None  Patient expects to be discharged to:: House      Emergency Contacts  Contact 1: Name: Dessiree Sze  Contact 1: Number: [phone]  Contact 1: Relationship: brother    Social/Functional History  Lives With: Other (comment) (children ages 40 and 40yo)  Type of Home: House  Home Layout: Multi-level  Bathroom Toilet: Soil scientist:  (denies)  Bathroom Accessibility: Accessible  Home Equipment:  (denies)  ADL Assistance: Independent  Homemaking Assistance: Independent  Homemaking Responsibilities: Yes  Ambulation Assistance: Independent  Transfer Assistance: Independent  Active Driver: Yes      Met with patient at bedside to review dc planning, patient recently back to room after ambulating around unit. She is from home independently with her two teenage children but states that she is going to stay at a friend's home for a few days prior to returning home because of the distance she lives away. Patient has insurance and is able to afford medications. Admitted for obesity. OR 2/15 for Laparoscopic revision of jejunojejunostomy, small bowel resection, EGD. Patient tolerating diet and ambulating without difficulty. She is interested in nebulizer at dc as she states that she has been short of breath for awhile and the resp treatments she has been receiving while inpt have helped. Message to Dr. Halford Chessman with this information. He is agreeable to order nebulizer and breathing treatments at home. Call to [facility] with DME order with plan to deliver to bedside prior to dc. Patient with active dc order and will have transportation  to friend's home.    Plan: dc today  Electronically signed by Benson Norway, RN on 12/19/2020 at 2:34 PM

## 2020-12-19 NOTE — Progress Notes (Signed)
Patient seen earlier and examined.  Doing well, no issues except for postop incisional pain, patient feeling much better than preop, her pain has resolved.  Lower extremity duplex negative, ambulating appropriately.  Discharge Planning

## 2020-12-19 NOTE — Discharge Summary (Signed)
Unable to determine

## 2020-12-19 NOTE — Progress Notes (Signed)
Bariatric Case Manager Note - POD #2 - AM    Patient s/p JJ revision    Patient doing well, no complaints     height is 5\' 6"  (1.676 m) and weight is 163 lb (73.9 kg). Her temporal temperature is 99.2 ??F (37.3 ??C). Her blood pressure is 131/83 and her pulse is 86. Her respiration is 24 and oxygen saturation is 100%.     Abdomen soft, nontender  Laparoscopy incisions: clean, dry and intact  Steri Strips Intact: Yes  LLQ incision packed: No      Tolerating liquids: Yes -advanced to full liquids  Ambulating: Yes  Using Incentive Spirometer as directed: Yes    Adequate Urine Output: Yes    Bowel sounds:hypoactive  Bowel Movement: Yes-liquid brown    Pain Control for Incision Pain:  Pain is well controlled with current treatment.  Current treatment: oxycodone    Patient will not be discharged on post-operative anti-coagulation therapy  If yes, reason:           Electronically signed by , RN on 12/19/2020 at 1:27 PM

## 2020-12-19 NOTE — Discharge Summary (Signed)
Physician Discharge Summary     Patient ID:  [name]  [id number]  40 y.o.  12-01-1980    Admit date: 12/17/2020  Discharge date: 12/19/2020    Admitting Physician: Annamary Rummage, DO   Discharge Physician: Annamary Rummage, DO     Admission Diagnoses:     Active Problems:    Obesity  Resolved Problems:    * No resolved hospital problems. *       Discharge Diagnoses:     Active Problems:    Obesity  Resolved Problems:    * No resolved hospital problems. *       Admission Condition: good  Discharged Condition: good    Indication for Admission: Obesity [E66.9]     Hospital Course: Pt taken to OR 12/17/2020 with Dr Garald Braver for Laparoscopic revision of Jejunojejunostomy and tolerated procedure without any complications. Immediately post-op, pt was extubated and transferred to PACU in stable condition. After PACU criteria met and within 24 hrs of procedure, patient was transferred to floor. UGI POD1 was without leak or obstruction. Throughout peri-operative course, there was monitoring of vital signs, urine output and clinical status of patient. Laboratory values were reviewed and the patients electrolytes were repleted as necessary. Diet was subsequently advanced and by the day of discharge patient was ambulating, voiding and tolerating PO without difficulty. Pain was well controlled with oral medications by time of discharge.      Disposition: home    Patient Instructions:      Medication List      START taking these medications    ondansetron 4 MG tablet  Commonly known as: ZOFRAN  Take 1 tablet by mouth 3 times daily as needed for Nausea or Vomiting     oxyCODONE-acetaminophen 5-325 MG per tablet  Commonly known as: Percocet  Take 1 tablet by mouth every 6 hours as needed for Pain for up to 7 days. Intended supply: 7 days. Take lowest dose possible to manage pain        CONTINUE taking these medications    ondansetron 4 MG disintegrating tablet  Commonly known as: ZOFRAN-ODT  Take 1 tablet by mouth 3 times daily as needed  for Nausea or Vomiting        ASK your doctor about these medications    amitriptyline 10 MG tablet  Commonly known as: ELAVIL     dicyclomine 20 MG tablet  Commonly known as: BENTYL     famotidine 40 MG tablet  Commonly known as: PEPCID  Take 1 tablet by mouth daily GERD     gabapentin 100 MG capsule  Commonly known as: NEURONTIN     IRON PO     promethazine 12.5 MG tablet  Commonly known as: PHENERGAN     vitamin B-1 100 MG tablet  Commonly known as: THIAMINE     vitamin B-12 1000 MCG tablet  Commonly known as: CYANOCOBALAMIN     VITAMIN D PO           Where to Get Your Medications      These medications were sent to [facility], [place]. [address] - P [phone] - [phone]  [place] [address] [place] [address]    Phone: [phone]   ?? ondansetron 4 MG disintegrating tablet  ?? ondansetron 4 MG tablet  ?? oxyCODONE-acetaminophen 5-325 MG per tablet        Activity: activity as tolerated  Diet: Bariatric FLD  Wound  Care: Steri strips will be present. These can be removed 5 days after surgery if they do not fall off on their own. Ok to shower, but no baths or swimming pools. Allow soap and water to run over incision site and pat dry.       Follow-up with your surgeon within 2 weeks for post-operative follow up. Call office phone number to schedule your appointment.     DC TIME 67mn    Signed:  AZenda Alpers MD  12/17/2020  4:13 PM

## 2020-12-21 LAB — SURGICAL PATHOLOGY

## 2020-12-23 ENCOUNTER — Ambulatory Visit
Admit: 2020-12-23 | Discharge: 2020-12-23 | Payer: PRIVATE HEALTH INSURANCE | Attending: Surgery | Primary: Student in an Organized Health Care Education/Training Program

## 2020-12-23 DIAGNOSIS — E617 Deficiency of multiple nutrient elements: Secondary | ICD-10-CM

## 2020-12-23 NOTE — Progress Notes (Signed)
[facility]  Bariatric Care Center    One Week Post Op RD Note -Revision of JJ        Pt is here for 1 week office visit. Pt is currently on a full liquid diet. Pt will advance to a pureed diet today and follow the diet for 10 days. Then, the pt will advance to a soft diet for 2 weeks. Both of these diets have been reviewed with the pt today.    Weight Loss: -97                    Pt will start the following vitamin supplements today:  -Multivitamin with minerals and iron  -Calcium  -Vitamin B12   -Vitamin D3    Protein requirements discussed. Pt to consume 65-75 grams daily.                            Fluid requirements discussed. Pt to consume 64 oz+ daily. Pt aware that he/she must consume fluids 30 mintues before and after meals.    Behavioral/Emotional changes reviewed    Patient feels comfortable with changes in eating behaviors and associated emotional changes      Notes/Comments: Pt is doing well, difficultly with fluids, will increase      Visit completed by:  Betsy Coder RD, LD

## 2020-12-23 NOTE — Telephone Encounter (Signed)
From: [name]  To: Mora Bellman  Sent: 12/23/2020 12:29 PM EST  Subject: Fmla paperwork    Hello.Marland KitchenMarland KitchenI had emailed my paperwork for fmla so i can get a paycheck its almost due can you help with this

## 2020-12-23 NOTE — Telephone Encounter (Signed)
To FMLA team for follow up

## 2020-12-23 NOTE — Progress Notes (Signed)
BARIATRIC AND METABOLIC SURGERY  [facility]  OFFICE VISIT  12/23/20    PATIENT: [name]      DATE OF BIRTH: 03-18-1981     ----------------------------------------------------------------------------------------------------------------------    HISTORY OF PRESENT ILLNESS     Chief Complaint: Post-operative visit    [name] is a 40 y.o. female known to the [facility] and for a post-operative visit following LAPAROSCOPIC REVISION OF JJ.   ROUX EN Y 2014-    The patient has had a good recovery and denies any major problems following the operation. The symptoms have resolved. The incisional pain has been under control. The patient reports some expected anorexia and constipation . The patient denies any problems with nausea, emesis, chest pain, shortness of breath or bowel function.     Review of Systems   Constitutional: Positive for fatigue. Negative for chills and fever.   HENT: Negative for sore throat and trouble swallowing.    Respiratory: Negative.  Negative for chest tightness and shortness of breath.    Cardiovascular: Negative.  Negative for chest pain and leg swelling.   Gastrointestinal: Positive for abdominal pain and constipation. Negative for abdominal distention, diarrhea, nausea and vomiting.   Genitourinary: Negative for decreased urine volume.   Musculoskeletal: Positive for arthralgias and back pain.   Skin: Negative for rash.   Neurological: Negative for dizziness, syncope and weakness.   Psychiatric/Behavioral: Negative for confusion.        PAST HISTORIES     Past Medical History:   Diagnosis Date   ??? Anemia    ??? Chronic back pain    ??? Deficiency of multiple nutrient elements    ??? Deficiency of multiple vitamins    ??? Fatigue    ??? GERD (gastroesophageal reflux disease)    ??? Hypoglycemia    ??? Intestinal malabsorption    ??? Irregular menstrual cycle    ??? Knee pain    ??? Sleep apnea    ??? SOB (shortness of breath)       Past Surgical History:    Procedure Laterality Date   ??? BREAST LUMPECTOMY Left 11/02/1998    Benign- [place]   ??? CESAREAN SECTION  11/03/2003   ??? CHOLECYSTECTOMY  11/02/2004    [place]   ??? COLONOSCOPY     ??? ECTOPIC PREGNANCY SURGERY     ??? ENDOSCOPY, COLON, DIAGNOSTIC     ??? GASTRIC BYPASS SURGERY  10/09/2013    [facility]   ??? HYSTERECTOMY  11/02/2010    [facility]   ??? ILEOSTOMY OR JEJUNOSTOMY  12/17/2020    revision   ??? OTHER SURGICAL HISTORY  09/07/2020    DX laparoscopy   ??? POLYPECTOMY      colon   ??? SALPINGECTOMY  11/02/2006    Ectopic Pregnancy- [facility]       PHYSICAL EXAM     BP 114/79    Pulse 104    Temp 97.5 ??F (36.4 ??C)    Resp 18    Ht 5' 4.5" (1.638 m)    Wt 196 lb 9.6 oz (89.2 kg)    SpO2 100%    BMI 33.23 kg/m??     Physical Exam  Constitutional:       Appearance: Normal appearance.   HENT:      Mouth/Throat:      Mouth: Mucous membranes are dry.   Cardiovascular:      Rate and Rhythm: Normal rate and regular rhythm.  Pulses: Normal pulses.      Heart sounds: Normal heart sounds.   Pulmonary:      Effort: Pulmonary effort is normal.      Breath sounds: Normal breath sounds.   Skin:     General: Skin is warm.   Neurological:      General: No focal deficit present.      Mental Status: She is alert. Mental status is at baseline.         LABORATORY STUDIES AND IMAGING     Pathology Results:    ASSESSMENT     Encounter Diagnoses   Name Primary?   ??? Deficiency of multiple nutrient elements Yes   ??? Intestinal malabsorption, unspecified type    ??? Iron deficiency    ??? Vitamin D deficiency    ??? Vitamin B deficiency    ??? Class 1 obesity due to excess calories with serious comorbidity and body mass index (BMI) of 33.0 to 33.9 in adult    ??? Gastroesophageal reflux disease without esophagitis        PLAN     ??? Ok for return to work or regular activity in 2week  ??? Follow up with me on a PRN basis  ??? Continue regular follow up for health maintenance evaluations with PCP.  ??? Annual post-operative follow up and blood-work with  obesity medicine specialist  ??? Steri strips removed       ATTESTATION      I reviewed with the patient the details of the proposed operation.  The risks benefits and options were discussed. Risks included but were not limited to bleeding, infection, damage to other surrounding organs, cardio-pulmonary complications related to anesthesia, conversion from laparoscopic to and open procedure, the need for reoperative or endoscopic therapy, the potential for prolonged mechanical ventilation, and death. All questions were fully answered to the patient's satisfaction and they wish to proceed with surgical intervention.    Electronically signed by Clarene Essex, DO on 12/23/20    Patient Care Team:  Clarene Essex, DO (General Surgery)  [facility]  Donette Larry, MD as Consulting Physician (Gastroenterology)  Donia Pounds, MD as Consulting Physician (Interventional Cardiology)

## 2020-12-23 NOTE — Progress Notes (Signed)
[facility]  BARIATRIC CARE CENTER  PROGRESS NOTE  POST WEIGHT LOSS SURGERY FOLLOW UP - 1 WEEK      Patient: [name]        Service Date: 12/23/20           Patient is 1 week s/p Revision of JJ    Today's Weight: 196.6  Today's BMI: 33.22    Patient has the following questions: NONE    Patient is diabetic    If patient IS Diabetic:    Patient  spoken with the physician who prescribes their diabetic medications    Patient  resumed their diabetic medications as directed by their physician    Patient advised as follows by physician prescribing diabetic medications:     Pain:  Patient rates pain on scale 0-10 as: 9    Exercise Compliance:  Compliance with Recommended Exercise Plan:  Exercising:    yes       If no, reason:        If yes:         Type:   walking          Times per week:    7 times         Min per session:    10 minutes/day    Falls Risk Assessment  Patient does not  take medications which affect BP or mental status   Patient does not have newly prescribed or changed dosage of medications within past 30 days which affect BP or mental status  Patient has not fallen in the past 2 months  Patient does not demonstrate unsteady gait  Patient uses the following ambulatory assistive devices: none  Patient states the presence of the following traits which increases risk of fall: none    Patient is low risk for falls. If high or moderate risk, patient instructed not to ambulate independently in the Center, and cord for call light placed within reach of patient.    Patient is not on home O2    Patient has has a time when it was difficult to intubate them      Pre-op Weight Metrics:  Date of Initial Surgical Consultation: Consult Date: 12/02/20    Initial Weight: Initial Weight: 210 lb 3.2 oz (95.3 kg)    Initial BMI: Initial BMI: 35.52    Ideal Body Weight: Ideal Body Weight: 131 lb (59.4 kg)    Highest Pre-Op Weight: Pre-Surgical Weight: 294 lb (133.4 kg)    Highest Pre-Op BMI: Pre Surgery BMI:  49.68    Excess Body Weight: Weight to Lose: 163 lb      Post-op Weight Metrics:   %EBWL: % EBWL: 60%  Weight Change Since Last Visit: Weight Change: -13.6 lbs  Weight Change from Highest Pre-op Weight: Total Weight Change: -97.4 lbs        Completed by: [employer], LPN

## 2020-12-23 NOTE — Other (Unsigned)
Patient Acct Nbr: [id number]   Primary AUTH/CERT:   Primary Insurance Company Name: [employer] Plan name: [facility] HMO PPO  Primary Insurance Group Number: [id number]  Primary Insurance Plan Type: Health  Primary Insurance Policy Number: [id number]    Secondary AUTH/CERT:   Secondary Insurance Company Name: [employer] Plan name: [facility]  Secondary Insurance Group Number: [facility]  Secondary Insurance Plan Type: Health  Secondary Insurance Policy Number: [id number]

## 2020-12-26 NOTE — Telephone Encounter (Signed)
Pt called to ask for extension on rtw, which was originally.Feb 29 ( not a date this year) but she is still having considerable pain.  Finding it difficult to bend over.  Her intake is fair, tolerating soft foods.  Needs to increase fluid intake.  No fever.    Advised needs to increase fluid intake, and continue to increase activity.  Pain will lessen over the next 4-5 days.    Team- please send new RTW letter--date of March 7.  Said, to  'send to same place as last time."  Thanks!

## 2020-12-26 NOTE — Telephone Encounter (Signed)
Pt RTW letter emailed to [email or web address]. Per TE 09/11/20 last RTW letter was emailed.

## 2020-12-31 DIAGNOSIS — G8918 Other acute postprocedural pain: Secondary | ICD-10-CM

## 2020-12-31 NOTE — Telephone Encounter (Signed)
From: RN Merril Abbe  To: [name]  Sent: 12/31/2020 10:46 AM EST  Subject: abdominal pain follow up    [name],    I received notification that you called the after hours line early this morning with abdominal pain. I tried to call you to follow up and see how you are feeling at this time but was unable to get through. Please let me know how you are feeling, either via this mychart message or you can call the med advice number and, if I do not answer, please leave a message and I will call you back as soon as I am able. [phone].    I await your communication!    Efraim Kaufmann, RN

## 2020-12-31 NOTE — Telephone Encounter (Signed)
Routed to triage nurse

## 2020-12-31 NOTE — Telephone Encounter (Signed)
LRYGB 10/09/13 MP  Diag lap 09/07/20 with LM  DOS 12/17/2020 REVISION OF JJ MP    Will route to NP for review and direction.

## 2020-12-31 NOTE — Telephone Encounter (Signed)
Name of Caller: [name] Call Back Number: [phone]    What Procedure/Surgery did you have done?:Laparoscopic revision of Jejunojejunostomy    Who was your surgeon?:Pozsgay    What date was your surgery?:12/17/20    What is the reason for your call today (symptom):stomach pain    What provider is being paged?:Pozsgay    Time page was sent: 1:04a    Page Content:  Name [name] 1981-04-10 Call Back Number [phone] Reason for page (symptom)stomach pain Name of Surgeon Pozsgay Type of SurgeryLaparoscopic revision of Jejunojejunostomy  Date of Surgery 12/17/20

## 2020-12-31 NOTE — Telephone Encounter (Signed)
Prior message indicates pt cannot use phone until Friday.  I messaged her earlier to work on bowel issues.

## 2020-12-31 NOTE — Telephone Encounter (Signed)
Pt noted to have responded via mychart. Indicated that she is unable to communicate via phone until Friday. See Mychart message for pt communication.

## 2020-12-31 NOTE — Telephone Encounter (Signed)
LRYGB 10/09/13 MP  Diag lap 09/07/20 with LM  DOS 12/17/2020 REVISION OF JJ MP    Last OV-12/23/20 with MP, next 01/20/21 with MP    Attempted to contact pt and unable to leave message for return call.     mychart message to pt:    [name],    I received notification that you called the after hours line early this morning with abdominal pain. I tried to call you to follow up and see how you are feeling at this time but was unable to get through. Please let me know how you are feeling, either via this mychart message or you can call the med advice number and, if I do not answer, please leave a message and I will call you back as soon as I am able.  [phone].    I await your communication!    Efraim Kaufmann, RN

## 2021-01-03 ENCOUNTER — Emergency Department
Admit: 2021-01-03 | Payer: PRIVATE HEALTH INSURANCE | Primary: Student in an Organized Health Care Education/Training Program

## 2021-01-03 ENCOUNTER — Inpatient Hospital Stay
Admit: 2021-01-03 | Discharge: 2021-01-04 | Disposition: A | Discharge status: Home / Self Care | Payer: PRIVATE HEALTH INSURANCE

## 2021-01-03 DIAGNOSIS — G8918 Other acute postprocedural pain: Secondary | ICD-10-CM

## 2021-01-03 LAB — CBC WITH AUTO DIFFERENTIAL
Basophils Absolute: 0 10*3/uL (ref 0.0–0.1)
Basophils: 0.9 %
Eosinophils Absolute: 0.2 10*3/uL (ref 0.0–0.4)
Eosinophils: 4.5 %
Hematocrit: 35 % — ABNORMAL LOW (ref 37.0–47.0)
Hemoglobin: 10.9 gm/dl — ABNORMAL LOW (ref 12.0–16.0)
Immature Grans (Abs): 0.02 10*3/uL (ref 0.00–0.07)
Immature Granulocytes: 0.4 %
Lymphocytes Absolute: 1.4 10*3/uL (ref 1.0–4.8)
Lymphocytes: 31.9 %
MCH: 27.5 pg (ref 26.0–33.0)
MCHC: 31.1 gm/dl — ABNORMAL LOW (ref 32.2–35.5)
MCV: 88.4 fL (ref 81.0–99.0)
MPV: 9 fL — ABNORMAL LOW (ref 9.4–12.4)
Monocytes Absolute: 0.4 10*3/uL (ref 0.4–1.3)
Monocytes: 9.4 %
Platelets: 543 10*3/uL — ABNORMAL HIGH (ref 130–400)
RBC: 3.96 10*6/uL — ABNORMAL LOW (ref 4.20–5.40)
RDW-CV: 13.4 % (ref 11.5–14.5)
RDW-SD: 43.5 fL (ref 35.0–45.0)
Seg Neutrophils: 52.9 %
Segs Absolute: 2.4 10*3/uL (ref 1.8–7.7)
WBC: 4.5 10*3/uL — ABNORMAL LOW (ref 4.8–10.8)
nRBC: 0 /100 wbc

## 2021-01-03 LAB — ANION GAP: Anion Gap: 10 meq/L (ref 8.0–16.0)

## 2021-01-03 LAB — COMPREHENSIVE METABOLIC PANEL W/ REFLEX TO MG FOR LOW K
ALT: 9 U/L — ABNORMAL LOW (ref 11–66)
AST: 13 U/L (ref 5–40)
Albumin: 3.6 g/dL (ref 3.5–5.1)
Alkaline Phosphatase: 63 U/L (ref 38–126)
BUN: 11 mg/dL (ref 7–22)
CO2: 24 meq/L (ref 23–33)
Calcium: 9.2 mg/dL (ref 8.5–10.5)
Chloride: 106 meq/L (ref 98–111)
Creatinine: 1 mg/dL (ref 0.4–1.2)
Glucose: 80 mg/dL (ref 70–108)
Potassium reflex Magnesium: 4.5 meq/L (ref 3.5–5.2)
Sodium: 140 meq/L (ref 135–145)
Total Bilirubin: <0.2 mg/dL — ABNORMAL LOW (ref 0.3–1.2)
Total Protein: 6.8 g/dL (ref 6.1–8.0)

## 2021-01-03 LAB — C-REACTIVE PROTEIN: CRP: <0.3 mg/dl (ref 0.00–1.00)

## 2021-01-03 LAB — OSMOLALITY: Osmolality Calc: 277.8 mOsmol/kg (ref 275.0–300.0)

## 2021-01-03 LAB — LIPASE: Lipase: 95.4 U/L — ABNORMAL HIGH (ref 5.6–51.3)

## 2021-01-03 MED ORDER — ONDANSETRON HCL 4 MG/2ML IJ SOLN
4 MG/2ML | Freq: Once | INTRAMUSCULAR | Status: AC
Start: 2021-01-03 — End: 2021-01-03
  Administered 2021-01-03: 23:00:00 via INTRAVENOUS

## 2021-01-03 MED ORDER — SODIUM CHLORIDE 0.9 % IV BOLUS
0.9 % | Freq: Once | INTRAVENOUS | Status: AC
Start: 2021-01-03 — End: 2021-01-03
  Administered 2021-01-03: 23:00:00 via INTRAVENOUS

## 2021-01-03 MED ORDER — MORPHINE SULFATE 4 MG/ML IJ SOLN
4 MG/ML | Freq: Once | INTRAMUSCULAR | Status: AC
Start: 2021-01-03 — End: 2021-01-03
  Administered 2021-01-03: 23:00:00 via INTRAVENOUS

## 2021-01-03 MED FILL — ONDANSETRON HCL 4 MG/2ML IJ SOLN: 4 MG/2ML | INTRAMUSCULAR | Qty: 2

## 2021-01-03 MED FILL — MORPHINE SULFATE 4 MG/ML IJ SOLN: 4 mg/mL | INTRAMUSCULAR | Qty: 1

## 2021-01-03 NOTE — ED Notes (Signed)
Pt to ED via intake with c/o abdominal pain. Pt reports they had a bowel resection done at [facility] on 2/15. Today pt reports their abdominal pain started on 2/28 and the pain worsens when they eat or drink. Pt also reports feeling dizzy. Pt VSS. IV inserted,.     Dina Rich, RN  01/03/21 [phone]

## 2021-01-03 NOTE — ED Provider Notes (Signed)
[facility]  eMERGENCY dEPARTMENT eNCOUnter          CHIEF COMPLAINT       Chief Complaint   Patient presents with   ??? Abdominal Pain       Nurses Notes reviewed and I agree except as noted in the HPI.    HISTORY OF PRESENT ILLNESS    [name] is a 40 y.o. female who presents to the Emergency Department for the evaluation of abdominal pain. Patient reports abdominal pain started on 12/30/20. Pain is stabbing and twisting constant pain. She denies any relieving factors. Eating or drinking makes the pain worse. Patient reports 6/10 pain in the middle of abdomen. Patient states she has vomited twice since pain started. She has not had a BM in 3 days. Patient reports hx of constipation and takes milk of mag which helps with BM but hasn't tried this over the past 3 days. Patient has been on a soft food diet from previous abdominal surgery on 12/17/20 for bowel resection. She reports decreased intake due to abdominal pain. Patient denies ETOH, smoking, or marijuana use. Patient denies fever or chills. Patient has had night sweats the last few days.        The HPI was provided by the patient.        REVIEW OF SYSTEMS     Review of Systems   Constitutional: Negative for chills and fever.   HENT: Negative for congestion, rhinorrhea and sore throat.    Eyes: Negative for pain.   Respiratory: Negative for cough and shortness of breath.    Cardiovascular: Negative for chest pain.   Gastrointestinal: Positive for abdominal pain, constipation, nausea and vomiting. Negative for diarrhea.   Genitourinary: Negative for difficulty urinating.   Skin: Negative for rash.   Neurological: Positive for light-headedness.   Psychiatric/Behavioral: Negative for agitation.   All other systems reviewed and are negative.      PAST MEDICAL HISTORY    has a past medical history of Anemia, Chronic back pain, Deficiency of multiple nutrient elements, Deficiency of multiple vitamins, Fatigue, GERD (gastroesophageal reflux  disease), Hypoglycemia, Intestinal malabsorption, Irregular menstrual cycle, Knee pain, Sleep apnea, and SOB (shortness of breath).    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    CURRENT MEDICATIONS       Discharge Medication List as of 01/03/2021  8:53 PM      CONTINUE these medications which have NOT CHANGED    Details   Pediatric Multivitamins-Iron (CHILDRENS MULTIVITAMIN/IRON PO) Take 1 tablet by mouth dailyHistorical Med      calcium carbonate (TUMS) 500 MG chewable tablet Take 1 tablet by mouth in the morning, at noon, and at bedtimeHistorical Med      docusate sodium (STOOL SOFTENER) 100 MG capsule Take 100 mg by mouth dailyHistorical Med      albuterol (PROVENTIL) (5 MG/ML) 0.5% nebulizer solution Take 1 mL by nebulization 3 times daily as needed for Wheezing, Disp-120 each, R-1Normal      ondansetron (ZOFRAN-ODT) 4 MG disintegrating tablet Take 1 tablet by mouth 3 times daily as needed for Nausea or Vomiting, Disp-21 tablet, R-0Normal      dicyclomine (BENTYL) 20 MG tablet Take 20 mg by mouth every 6 hours as  neededHistorical Med      gabapentin (NEURONTIN) 100 MG capsule Take 100 mg by mouth daily. Unsure of doseHistorical Med      amitriptyline (ELAVIL) 10 MG tablet Take 10 mg by mouth nightlyHistorical Med      famotidine (PEPCID) 40 MG tablet Take 1 tablet by mouth daily GERD, Disp-30 tablet, R-0Print      Ferrous Sulfate (IRON PO) Take 15 mLs by mouth dailyHistorical Med      VITAMIN D PO Take by mouth once a week Historical Med      vitamin B-1 (THIAMINE) 100 MG tablet Take 100 mg by mouth dailyHistorical Med      promethazine  (PHENERGAN) 12.5 MG tablet Take 12.5 mg by mouth every 6 hours as needed for NauseaHistorical Med      vitamin B-12 (CYANOCOBALAMIN) 1000 MCG tablet Place 1,000 mcg under the tongue once a week Indications: SUPPLEMENT             ALLERGIES     is allergic to amoxicillin, pcn [penicillins], and zoloft [sertraline hcl].    FAMILY HISTORY     She indicated that her mother is deceased. She indicated that her father is alive. She indicated that her sister is alive. She indicated that her brother is alive. She indicated that her daughter is alive. She indicated that her son is alive. She indicated that the status of her other is unknown.   family history includes Breast Cancer in an other family member; Diabetes in her father; Heart Disease in her mother; High Blood Pressure in her father, mother, and sister; Mental Illness in her father, mother, and sister; Thyroid Disease in her mother and sister.    SOCIAL HISTORY      reports that she has never smoked. She has never used smokeless tobacco. She reports current alcohol use. She reports that she does not use drugs.    PHYSICAL EXAM     INITIAL VITALS:  height is 5' 4.5" (1.638 m) and weight is 193 lb (87.5 kg). Her oral temperature is 98.5 ??F (36.9 ??C). Her blood pressure is 116/78 and her pulse is 90. Her respiration is 17 and oxygen saturation is 100%.    Physical Exam  Vitals and nursing note reviewed.   Constitutional:       Appearance: She is well-developed.   HENT:      Head: Normocephalic and atraumatic.   Eyes:      Conjunctiva/sclera: Conjunctivae normal.      Pupils: Pupils are equal, round, and reactive to light.   Cardiovascular:      Rate and Rhythm: Normal rate and regular rhythm.      Heart sounds: Normal heart sounds. No murmur heard.  No gallop.    Pulmonary:      Effort: Pulmonary effort is normal. No respiratory distress.      Breath sounds: Normal breath sounds. No wheezing or rales.   Abdominal:      General: Bowel sounds are normal.      Palpations:  Abdomen is soft.      Tenderness: There is abdominal tenderness in the epigastric area, periumbilical area and left upper quadrant. There is no right CVA tenderness, left CVA tenderness, guarding or rebound. Negative signs include Murphy's sign.   Musculoskeletal:         General: Normal range of motion.      Cervical back: Normal range of motion.   Skin:     General: Skin is warm and dry.  Neurological:      Mental Status: She is alert and oriented to person, place, and time.   Psychiatric:         Mood and Affect: Mood normal.         Behavior: Behavior normal.         DIFFERENTIAL DIAGNOSIS:   Differential diagnoses are discussed    DIAGNOSTIC RESULTS     EKG: All EKG's are interpreted by the Emergency Department Physician who either signs or Co-signsthis chart in the absence of a cardiologist.          RADIOLOGY: non-plain film images(s) such as CT, Ultrasound and MRI are read by the radiologist.    CT ABDOMEN PELVIS W IV CONTRAST Additional Contrast? Oral   Final Result   No bowel obstruction.      This document has been electronically signed by: Salli RealMohammed Fareed Quraishi,    MD on 01/03/2021 08:21 PM      All CTs at this facility use dose modulation techniques and iterative    reconstructions, and/or weight-based dosing   when appropriate to reduce radiation to a low as reasonably achievable.          LABS:      Labs Reviewed   CBC WITH AUTO DIFFERENTIAL - Abnormal; Notable for the following components:       Result Value    WBC 4.5 (*)     RBC 3.96 (*)     Hemoglobin 10.9 (*)     Hematocrit 35.0 (*)     MCHC 31.1 (*)     Platelets 543 (*)     MPV 9.0 (*)     All other components within normal limits   COMPREHENSIVE METABOLIC PANEL W/ REFLEX TO MG FOR LOW K - Abnormal; Notable for the following components:    Total Bilirubin <0.2 (*)     ALT 9 (*)     All other components within normal limits   LIPASE - Abnormal; Notable for the following components:    Lipase 95.4 (*)     All other components within normal  limits   URINE WITH REFLEXED MICRO - Abnormal; Notable for the following components:    Ketones, Urine TRACE (*)     Character, Urine CLOUDY (*)     All other components within normal limits   GLOMERULAR FILTRATION RATE, ESTIMATED - Abnormal; Notable for the following components:    Est, Glom Filt Rate 74 (*)     All other components within normal limits   C-REACTIVE PROTEIN   ANION GAP   OSMOLALITY       EMERGENCY DEPARTMENT COURSE:   Vitals:    Vitals:    01/03/21 1638 01/03/21 1924   BP: 110/73 116/78   Pulse: 90    Resp: 17    Temp: 98.5 ??F (36.9 ??C)    TempSrc: Oral    SpO2: 100% 100%   Weight: 193 lb (87.5 kg)    Height: 5' 4.5" (1.638 m)       5:38 PM EST: The patient was seen and evaluated.    Patient presents for complaints of abdominal pain that is been ongoing for the past 5 days, started 2 weeks after having bowel resection.  States she has had difficulty maintaining the recommended fluid intake as any oral intake will increase her pain.  She was advised by the surgery team to increase fluid intake and was advised medications to cingulate bowel movement but did not attempt this, rather  came to the ED for evaluation.  She has stable anemia, white blood cell count 4.5 with trace ketonuria.  Lipase 95 and laboratory studies are otherwise within normal limits including CRP less than 0.3.  CT of the abdomen was obtained with IV and oral contrast and shows no acute abnormality.  There is minimal soft tissue gas in the left upper quadrant subcutaneous fat along with minor fat induration likely related to recent incision with no fluid collection.  This does correlate with patient's abdominal exam and results were discussed with the patient.  She was treated in the department with IV fluids, morphine and Zofran with improvement in her symptoms.  Discussed outpatient follow-up, following the instructions to produce bowel movement per her surgery team and patient was agreeable.  Return precautions were  discussed.    CRITICAL CARE:   None    CONSULTS:  None    PROCEDURES:  None    FINAL IMPRESSION      1. Post-op pain    2. Non-intractable vomiting with nausea, unspecified vomiting type          DISPOSITION/PLAN   Discharge    PATIENT REFERRED TO:  Clarene Essex, DO  [address], [address]  [place] [address]  [phone]    Call in 3 days      [facility]  [address]  [place] [address]  [phone]    If symptoms worsen      DISCHARGEMEDICATIONS:  Discharge Medication List as of 01/03/2021  8:53 PM          (Please note that portions of this note were completedwith a voice recognition program.  Efforts were made to edit the dictations but occasionally words are mis-transcribed.)        Ramiro Harvest, PA-C  01/09/21 [phone]

## 2021-01-04 LAB — URINE WITH REFLEXED MICRO
Bilirubin Urine: NEGATIVE
Blood, Urine: NEGATIVE
CASTS 2: NONE SEEN /lpf
Crystals, UA: NONE SEEN
Glucose, Ur: NEGATIVE mg/dl
Leukocyte Esterase, Urine: NEGATIVE
MISCELLANEOUS 2: NONE SEEN
Nitrite, Urine: NEGATIVE
Protein, UA: NEGATIVE
Renal Epithelial, UA: NONE SEEN
Specific Gravity, Urine: 1.026 (ref 1.002–1.030)
Urobilinogen, Urine: 1 eu/dl (ref 0.0–1.0)
Yeast, UA: NONE SEEN
pH, UA: 5 (ref 5.0–9.0)

## 2021-01-04 MED ORDER — IOPAMIDOL 76 % IV SOLN
76 % | Freq: Once | INTRAVENOUS | Status: AC | PRN
Start: 2021-01-04 — End: 2021-01-03
  Administered 2021-01-04: 01:00:00 via INTRAVENOUS

## 2021-01-20 ENCOUNTER — Ambulatory Visit
Admit: 2021-01-20 | Discharge: 2021-01-20 | Payer: PRIVATE HEALTH INSURANCE | Attending: Surgery | Primary: Student in an Organized Health Care Education/Training Program

## 2021-01-20 DIAGNOSIS — K219 Gastro-esophageal reflux disease without esophagitis: Secondary | ICD-10-CM

## 2021-01-20 NOTE — Progress Notes (Signed)
[facility]  BARIATRIC CARE CENTER  PROGRESS NOTE  POST WEIGHT LOSS SURGERY FOLLOW UP      Patient: [name]        Service Date: 01/20/21           Patient is 1  month s/p revision of JJ 12/17/2020     Today's Weight: 194.2  Today's BMI: 32.82    Post-op Weight Metrics:   %EBWL: % EBWL: 61%  Weight Change Since Last Visit: Weight Change: -2.4 lbs  Weight Change from Highest Pre-op Weight: Total Weight Change: -99.8 lbs    Patient has the following questions: Pt still has "twisting" pain, states when stomach "rumbles" is when it feels as though it is twisting pt states accompanied by nausea/vomiting/dry heaving, states cramps to same spot eating/drinking triggers pain? Pt states unable to have BM w/o assistance of stool softners/laxatives? Last BM saturday 01/18/2021 watery, green tint. Refill of bentyl? Pt states starting to get dizzy 3-4x a day standing up/sitting down    Pt would like  A dr closer to her house?     Pain:  Patient rates pain on scale 0-10 as: 6      Exercise Compliance:  Compliance with Recommended Exercise Plan:  Exercising:    no       If no, reason:        If yes:         Type:             Times per week:             Min per session:        See exercise section of office visit note for additional information     Diabetes   1. Do you currently have diabetes?       []  Yes [x]  No   2. Are you currently prescribed insulin?      []  Yes  [x]  No   []  N/A   3. Are you currently prescribed an oral medication for diabetes?    []  Yes  [x]  No   []  N/A       GERD (Gastroesophageal Reflux Disease)   1. Do you currently have GERD?       [x]  Yes  []  No   2. Do you get heartburn type symptoms more than twice per week? []  Yes  [x]  No   3. Are you currently on a medication for GERD? (not TUMS)  (examples: Prilosec/omeprazole, Zantac/ranitidine, etc.)    [x]  Yes  []  No      Hyperlipidemia (high cholesterol)   1. Do you currently have a diagnosis of high cholesterol?    []  Yes  [x]  No   2. Are you  currently prescribed a medication for high cholesterol?          (examples Lipitor/Atorvastatin, Pravastatin, Zetia, Tricor, etc.)    []  Yes  [x]  No   3. Have you been diagnosed with high cholesterol but chosen not         to take medication?      []  Yes  [x]  No      Hypertension (high blood pressure)   1. Do you currently have a diagnosis of Hypertension?     []  Yes  [x]  No   2. Are you currently on a medication for Hypertension?    []  Yes  [x]  No   3. Have you been diagnosed with Hypertension but have chosen   not to take  the medication?       []  Yes  [x]  No       Sleep Apnea   1. Do you currently have Sleep Apnea?      []  Yes  [x]  No    2. Are you on a device (CPAP, BiPAP, etc) for Sleep Apnea?    []  Yes  [x]  No    3. Have you been diagnosed with Sleep Apnea but cannot tolerate   or have chosen not to treat?     []  Yes  [x]  No       Falls Risk Assessment  Patient does not take medications which affect BP or mental status   Patient does not have newly prescribed or changed dosage of medications within past 30 days which affect BP or mental status  Patient has not fallen in the past 2 months  Patient does not demonstrate unsteady gait  Patient uses the following ambulatory assistive devices: none  Patient states the presence of the following traits which increases risk of fall: none    Patient has had a time when it was difficult for an IV to be placed.    Patient is not on home O2    Patient has has a time when it was difficult to intubate them      Pre-op Weight Metrics:  Date of Initial Surgical Consultation: Consult Date: 12/02/20    Initial Weight: Initial Weight: 210 lb 3.2 oz (95.3 kg)    Initial BMI: Initial BMI: 35.52    Ideal Body Weight: Ideal Body Weight: 131 lb (59.4 kg)    Highest Pre-Op Weight: Pre-Surgical Weight: 294 lb (133.4 kg)    Highest Pre-Op BMI: Pre Surgery BMI: 49.68    Excess Body Weight: Weight to Lose: 163 lb        Labs Completed: yes -   If NO, patient instructed to get labs drawn  today or ASAP    If YES:01/03/2021    Labs completed at [facility]? yes    If yes see Labs Tab    Labs completed at [facility] facility?     If yes see Encounters Tab - Orders only - Historical Provider - Date:         Completed by: , LPN

## 2021-01-20 NOTE — Other (Unsigned)
Patient Acct Nbr: [id number]   Primary AUTH/CERT:   Primary Insurance Company Name: [employer] Plan name: [facility] HMO PPO  Primary Insurance Group Number: [id number]  Primary Insurance Plan Type: Health  Primary Insurance Policy Number: [id number]    Secondary AUTH/CERT:   Secondary Insurance Company Name: [employer] Plan name: [facility]  Secondary Insurance Group Number: [facility]  Secondary Insurance Plan Type: Health  Secondary Insurance Policy Number: [id number]

## 2021-01-20 NOTE — Progress Notes (Signed)
HPI, PHYSICAL EXAMINATION & PLAN  POST-OP     HPI: Patient here today for 1 month post-weight loss surgery follow up    female patient feeling fairly well. Denies dysphagia, or any GERD Sx. Currently is on a PPI. Patient states diet and exercise is going fairly well. Currently is  eating 65-75 gm/day protein, and is  compliant with prescribed multivitamins and supplements.    RYGB 10/09/13 MP  Diag lap 09/07/20 with LM  DOS 12/17/2020 REVISION OF JJ    Ongoing nausea, takes Zofran, little relief.  Phenergan works but causes fatigue.  Bentyl helps mostly, has upper abdominal cramping.  Any food or liquid will cause her stomach to cramp.  Feels food just sist in stomach.  No tobacco or etoh.  Reports constipation tales multiple medication   Ongoing constipation, takes stool softener, milk of mag, seen by GI in the past.     Review of Systems   Constitutional: Positive for fatigue. Negative for chills and fever.   HENT: Negative for sore throat and trouble swallowing.    Respiratory: Negative.  Negative for chest tightness and shortness of breath.    Cardiovascular: Negative.  Negative for chest pain and leg swelling.   Gastrointestinal: Positive for abdominal pain and constipation. Negative for abdominal distention, diarrhea, nausea and vomiting.   Genitourinary: Negative.  Negative for decreased urine volume.   Musculoskeletal: Positive for back pain.   Skin: Negative for rash.   Neurological: Negative for dizziness, syncope and weakness.   Psychiatric/Behavioral: Negative for confusion.     Vital signs are stable. Labs were Completed. All labs were: pending    Physical Examination:     BP 123/78    Pulse 85    Temp 96.8 ??F (36 ??C)    Resp 18    Ht 5' 4.5" (1.638 m)    Wt 194 lb 3.2 oz (88.1 kg)    BMI 32.82 kg/m??     General:   This patient is awake, alert, and oriented, and is in no apparent distress.    Abdomen:   Obese, soft, non-tender, non-distended without masses/ No evidence of abdominal hernia / Incisions  consistent with previous surgeries.  Extremities:   No cyanosis, clubbing or edema/ No calf tenderness/No restrictions of movement, is ambulatory without assistance.  Neurological:   Intact x 4 extremities, no focal deficits notes.  Skin:    No rashes or lesions noted.    Rectal:   Deferred    Surgical site:  YH:CWCBJ, dry, intact, and nontender  Drainage from surgical site: none  Patient does not have a superficial incisional SSI      Current Medications:   Patient's Medications   New Prescriptions    No medications on file   Previous Medications    ALBUTEROL (PROVENTIL) (5 MG/ML) 0.5% NEBULIZER SOLUTION    Take 1 mL by nebulization 3 times daily as needed for Wheezing    AMITRIPTYLINE (ELAVIL) 10 MG TABLET    Take 10 mg by mouth nightly     CALCIUM CARBONATE (TUMS) 500 MG CHEWABLE TABLET    Take 1 tablet by mouth in the morning, at noon, and at bedtime    DICYCLOMINE (BENTYL) 20 MG TABLET    Take 20 mg by mouth every 6 hours as needed     DOCUSATE SODIUM (STOOL SOFTENER) 100 MG CAPSULE    Take 100 mg by mouth as needed     FAMOTIDINE (PEPCID) 40 MG TABLET    Take 1  tablet by mouth daily GERD    FERROUS SULFATE (IRON PO)    Take 15 mLs by mouth daily     GABAPENTIN (NEURONTIN) 100 MG CAPSULE    Take 100 mg by mouth daily. Unsure of dose    MAGNESIUM HYDROXIDE (MILK OF MAGNESIA PO)    Take by mouth as needed 01/16/2021 last dose    ONDANSETRON (ZOFRAN-ODT) 4 MG DISINTEGRATING TABLET    Take 1 tablet by mouth 3 times daily as needed for Nausea or Vomiting    PEDIATRIC MULTIVITAMINS-IRON (CHILDRENS MULTIVITAMIN/IRON PO)    Take 1 tablet by mouth daily    PROMETHAZINE (PHENERGAN) 12.5 MG TABLET    Take 12.5 mg by mouth every 6 hours as needed for Nausea     SENNA (SENOKOT) 8.6 MG TABLET    Take 1 tablet by mouth as needed for Constipation    VITAMIN B-1 (THIAMINE) 100 MG TABLET    Take 100 mg by mouth daily     VITAMIN B-12 (CYANOCOBALAMIN) 1000 MCG TABLET    Place 1,000 mcg under the tongue once a week Indications:  SUPPLEMENT     VITAMIN D PO    Take 50,000 Units by mouth once a week    Modified Medications    No medications on file   Discontinued Medications    No medications on file       Medications ordered during this encounter:   Outpatient Encounter Medications as of 01/20/2021   Medication Sig Dispense Refill   ??? Magnesium Hydroxide (MILK OF MAGNESIA PO) Take by mouth as needed 01/16/2021 last dose     ??? senna (SENOKOT) 8.6 MG tablet Take 1 tablet by mouth as needed for Constipation     ??? docusate sodium (STOOL SOFTENER) 100 MG capsule Take 100 mg by mouth as needed      ??? albuterol (PROVENTIL) (5 MG/ML) 0.5% nebulizer solution Take 1 mL by nebulization 3 times daily as needed for Wheezing 120 each 1   ??? ondansetron (ZOFRAN-ODT) 4 MG disintegrating tablet Take 1 tablet by mouth 3 times daily as needed for Nausea or Vomiting 21 tablet 0   ??? dicyclomine (BENTYL) 20 MG tablet Take 20 mg by mouth every 6 hours as needed      ??? amitriptyline (ELAVIL) 10 MG tablet Take 10 mg by mouth nightly      ??? famotidine (PEPCID) 40 MG tablet Take 1 tablet by mouth daily GERD 30 tablet 0   ??? Ferrous Sulfate (IRON PO) Take 15 mLs by mouth daily      ??? VITAMIN D PO Take 50,000 Units by mouth once a week      ??? vitamin B-1 (THIAMINE) 100 MG tablet Take 100 mg by mouth daily      ??? promethazine (PHENERGAN) 12.5 MG tablet Take 12.5 mg by mouth every 6 hours as needed for Nausea      ??? vitamin B-12 (CYANOCOBALAMIN) 1000 MCG tablet Place 1,000 mcg under the tongue once a week Indications: SUPPLEMENT      ??? Pediatric Multivitamins-Iron (CHILDRENS MULTIVITAMIN/IRON PO) Take 1 tablet by mouth daily (Patient not taking: Reported on 01/20/2021)     ??? calcium carbonate (TUMS) 500 MG chewable tablet Take 1 tablet by mouth in the morning, at noon, and at bedtime (Patient not taking: Reported on 01/20/2021)     ??? gabapentin (NEURONTIN) 100 MG capsule Take 100 mg by mouth daily. Unsure of dose (Patient not taking: Reported on 01/20/2021)  No  facility-administered encounter medications on file as of 01/20/2021.       Orders Placed This Encounter   Procedures   ??? Comprehensive Metabolic Panel   ??? Iron   ??? Ferritin   ??? Folate   ??? Magnesium   ??? Zinc   ??? Vitamin B12   ??? CBC       Visit Diagnoses:   1. Gastroesophageal reflux disease without esophagitis    2. Vitamin D deficiency    3. Vitamin B deficiency    4. Calcium deficiency    5. Iron deficiency    6. Deficiency of multiple nutrient elements    7. Intestinal malabsorption, unspecified type    8. Class 1 obesity due to excess calories with serious comorbidity and body mass index (BMI) of 32.0 to 32.9 in adult        Plan:      1). Cleared for all activity, no weight restrictions. All incisions healed.  2). Continue PPI until 6 month office visit  3). Labs: pending  4). F/u at 3 month office visit with standard labs    5). Progressing diet as tolerated per RD  6). Psych concerns: No  7). Dysphagia: No  8). Constipation- encouraged to f/u w GI  9). Nausea and abdominal pain- CT early March- normal.   Pt advised to seek second surgical opinion.      met with the dietitian today to review are vitamin and protein recommendations.  Increase activity as recommended, the wounds are healed, no evidence of abdominal wall hernias.  No nausea vomiting or dysphagia noted.  Appropriate bowel function, discussed with her regarding contacting us for any questions or concerns.  The lab slip was signed for the next visit, labs from n/a were reviewed and are within normal limits.  Follow-up 3 months postop.    She did well for 2 weeks post op, then started having epigastric/right sided pain again- same as prior to surgery.  The CT scans prior to surgery clearly showed pathology at the [facility] and the patient had no pain for 2 weeks post op.  She has since had a CT which only shows siginficant stool burden. She states that she will sometimes not have a bowel movement for 2 weeks and can't have a bm unless she takes medication.   I feel the majority of her pain is likely related to fecal burden.  She will follow with pcp to try Linzess and also I encouraged her to seek a second opnion close to where she lives in [place].  I asked her to please call us for follow up to see if she is able to make any progress.    I personally performed the evaluation and management of MAYMIE BRUNKE in the development of a treatment plan for this patient. I personally interviewed the patient and performed an individual physical examination. In addition, I discussed the patient's condition and treatment options with them. I have also reviewed and agree with the past medical, family and social history unless otherwise noted. All of the patient's questions were answered.     Patient Care Team:  Annamary Rummage, DO (General Surgery)  [facility]  Felizardo Hoffmann, MD as Consulting Physician (Gastroenterology)  Lawerance Cruel, MD as Consulting Physician (Interventional Cardiology)    Electronically signed by Annamary Rummage, DO on 01/20/21

## 2021-01-20 NOTE — Progress Notes (Signed)
[facility]  Bariatric Care Center    One Month Post Op RD Office Visit      Current diet: Soft   Reviewed Maintenance diet with pt and ability to add nuts, skins, seeds and salads today    Weight Loss:  -99.8 after revision    Patient consumes 3-4 small meals daily   Patient's portions are adequate for current diet: ?? cup  Protein requirements discussed- currently consuming     40-60     grams daily.  Current protein sources: shakes, yogurt, pudding textures work  Recommendations: Will cont to try to advance diet to pureed/soft    Vitamins discussed and Patient is taking the following:  -Multivitamin with minerals and iron  -Calcium  -Vitamin B12  -Vitamin D3    No new labs, taking all vitamins     Fluid requirements discussed  Current Fluid Intake:  40+  Pt is drinking sugar-free, caffeine-free and carbonation-free fluids only.    Patient waiting 30 minutes before and after meals to drink       Behavioral/Emotional changes reviewed  Patient feels comfortable with changes in eating behaviors and associated emotional changes      Notes/Comments: doing okay with full liquids and will advance as able, will increase fluids from 40-64 oz      Visit completed by:  Betsy Coder RD, LD

## 2021-01-28 ENCOUNTER — Inpatient Hospital Stay
Admit: 2021-01-28 | Discharge: 2021-01-28 | Disposition: A | Discharge status: Home / Self Care | Payer: PRIVATE HEALTH INSURANCE

## 2021-01-28 DIAGNOSIS — G8929 Other chronic pain: Secondary | ICD-10-CM

## 2021-01-28 DIAGNOSIS — R109 Unspecified abdominal pain: Secondary | ICD-10-CM

## 2021-01-28 LAB — CBC WITH AUTO DIFFERENTIAL
Basophils Absolute: 0 10*3/uL (ref 0.0–0.1)
Basophils: 1.1 %
Eosinophils Absolute: 0.1 10*3/uL (ref 0.0–0.4)
Eosinophils: 3 %
Hematocrit: 34.3 % — ABNORMAL LOW (ref 37.0–47.0)
Hemoglobin: 10.5 gm/dl — ABNORMAL LOW (ref 12.0–16.0)
Immature Grans (Abs): 0.01 10*3/uL (ref 0.00–0.07)
Immature Granulocytes: 0.3 %
Lymphocytes Absolute: 1.4 10*3/uL (ref 1.0–4.8)
Lymphocytes: 39.1 %
MCH: 27.3 pg (ref 26.0–33.0)
MCHC: 30.6 gm/dl — ABNORMAL LOW (ref 32.2–35.5)
MCV: 89.3 fL (ref 81.0–99.0)
MPV: 9.5 fL (ref 9.4–12.4)
Monocytes Absolute: 0.4 10*3/uL (ref 0.4–1.3)
Monocytes: 10.2 %
Platelets: 359 10*3/uL (ref 130–400)
RBC: 3.84 10*6/uL — ABNORMAL LOW (ref 4.20–5.40)
RDW-CV: 13.9 % (ref 11.5–14.5)
RDW-SD: 45.2 fL — ABNORMAL HIGH (ref 35.0–45.0)
Seg Neutrophils: 46.3 %
Segs Absolute: 1.7 10*3/uL — ABNORMAL LOW (ref 1.8–7.7)
WBC: 3.6 10*3/uL — ABNORMAL LOW (ref 4.8–10.8)
nRBC: 0 /100 wbc

## 2021-01-28 LAB — COMPREHENSIVE METABOLIC PANEL W/ REFLEX TO MG FOR LOW K
ALT: 10 U/L — ABNORMAL LOW (ref 11–66)
AST: 13 U/L (ref 5–40)
Albumin: 3.6 g/dL (ref 3.5–5.1)
Alkaline Phosphatase: 70 U/L (ref 38–126)
BUN: 9 mg/dL (ref 7–22)
CO2: 27 meq/L (ref 23–33)
Calcium: 9.1 mg/dL (ref 8.5–10.5)
Chloride: 106 meq/L (ref 98–111)
Creatinine: 1.2 mg/dL (ref 0.4–1.2)
Glucose: 81 mg/dL (ref 70–108)
Potassium reflex Magnesium: 4.6 meq/L (ref 3.5–5.2)
Sodium: 140 meq/L (ref 135–145)
Total Bilirubin: 0.2 mg/dL — ABNORMAL LOW (ref 0.3–1.2)
Total Protein: 6.5 g/dL (ref 6.1–8.0)

## 2021-01-28 LAB — PTH, INTACT: Pth Intact: 75.7 pg/mL — ABNORMAL HIGH (ref 15.0–65.0)

## 2021-01-28 LAB — LACTIC ACID: Lactic Acid: 1 mmol/L (ref 0.5–2.0)

## 2021-01-28 LAB — LIPASE: Lipase: 41 U/L (ref 5.6–51.3)

## 2021-01-28 LAB — ANION GAP: Anion Gap: 7 meq/L — ABNORMAL LOW (ref 8.0–16.0)

## 2021-01-28 LAB — T4, FREE: T4 Free: 1.3 ng/dL (ref 0.93–1.76)

## 2021-01-28 LAB — OSMOLALITY: Osmolality Calc: 277.1 mOsmol/kg (ref 275.0–300.0)

## 2021-01-28 LAB — TSH: TSH: 2.95 u[IU]/mL (ref 0.400–4.200)

## 2021-01-28 MED ORDER — METOCLOPRAMIDE HCL 10 MG PO TABS
10 MG | ORAL_TABLET | Freq: Four times a day (QID) | ORAL | 0 refills | Status: AC | PRN
Start: 2021-01-28 — End: ?

## 2021-01-28 NOTE — ED Notes (Signed)
Patient to ED by self. Patient ambulated to room. Patient states she has had mid abdominal pain for many years. She has a history of associated abdominal surgeries and procedures. Patient states pain has gotten worse today. She states that she has seen multiple specialists as well as another physician to get second opinions which recommended interventions including but not limited to ERCP. Patient resting easy in bed, unlabored respirations, side rails up x2, and call light in reach.     [name]  01/28/21 1601       Amie Portland, RN  01/28/21 1616

## 2021-01-28 NOTE — ED Provider Notes (Signed)
[facility] CENTER  eMERGENCY dEPARTMENT eNCOUnter          CHIEF COMPLAINT       Chief Complaint   Patient presents with   ??? Abdominal Pain       Nurses Notes reviewed and I agree except as noted inthe HPI.    HISTORY OF PRESENT ILLNESS    [name] is a 40 y.o. female who presents to the Emergency Department for the evaluation of chronic epigastric abdominal pain. She reports the abdominal pain has been an ongoing complaint since 2016. She states the pain is currently an 8/10. Pain is a stabbing constant pain. She denies any alleviating factors. Patient reports eating and drinking make the pain worse. She also reports frequent vomiting to nonspecific foods and beverages. She reports chronic constipation and has not had a bowel movement in 6 days. She reports being about 50% compliant with taking Linzess which was recently prescribed about a week ago. She states this produces a bowel movement but it is small and hard. Patient reports still having night sweats at random. On review of medications, patient reports never taking Tums, has discontinued taking Pepcid Dulcolax, and Milk of Magnesia since February, and does not take Zofran of Phenergan due to no relief. Patient reports improvement in sleep with Bentyl but ran out of prescriptions 3 weeks ago, last took Senna 1 week ago, and has taken Linzess about 50% of the time since being prescribed about a week ago. She states poor compliance with taking Vitamin supplements, but takes her iron when her symptoms start to occur. Patient denies any further associated fever, melena, hematochezia, dysuria, oliguria, edema, shortness of breath, or chest pain.  She presents today without any acute change in symptoms but states she recently got a second opinion via an online provider and could not get her GI provider to contact her back to discuss the additional testing that was recommended.  She was frustrated by this and was not sure how to proceed so she came  to the emergency department for evaluation.  Denies acute change in her symptoms today.      The HPI was provided by the patient.        REVIEW OF SYSTEMS     Review of Systems   Constitutional: Positive for appetite change (solids and liquids cause pain and vomiting), diaphoresis (night sweats) and unexpected weight change (reports 30lb loss since November 2021).   Gastrointestinal: Positive for abdominal pain, constipation, nausea and vomiting.   Neurological: Positive for dizziness.   All other systems reviewed and are negative.      PAST MEDICAL HISTORY    has a past medical history of Anemia, Chronic back pain, Deficiency of multiple nutrient elements, Deficiency of multiple vitamins, Fatigue, GERD (gastroesophageal reflux disease), Hypoglycemia, Intestinal malabsorption, Irregular menstrual cycle, Knee pain, Sleep apnea, and SOB (shortness of breath).    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    CURRENT MEDICATIONS       Previous Medications    ALBUTEROL (PROVENTIL) (5 MG/ML) 0.5% NEBULIZER SOLUTION    Take 1 mL by nebulization 3 times daily as needed for Wheezing    AMITRIPTYLINE (ELAVIL) 10 MG TABLET    Take 10 mg by mouth nightly  CALCIUM CARBONATE (TUMS) 500 MG CHEWABLE TABLET    Take 1 tablet by mouth in the morning, at noon, and at bedtime    DICYCLOMINE (BENTYL) 20 MG TABLET    Take 20 mg by mouth every 6 hours as needed     DOCUSATE SODIUM (STOOL SOFTENER) 100 MG CAPSULE    Take 100 mg by mouth as needed     FAMOTIDINE (PEPCID) 40 MG TABLET    Take 1 tablet by mouth daily GERD    FERROUS SULFATE (IRON PO)    Take 15 mLs by  mouth daily     GABAPENTIN (NEURONTIN) 100 MG CAPSULE    Take 100 mg by mouth daily. Unsure of dose    MAGNESIUM HYDROXIDE (MILK OF MAGNESIA PO)    Take by mouth as needed 01/16/2021 last dose    ONDANSETRON (ZOFRAN-ODT) 4 MG DISINTEGRATING TABLET    Take 1 tablet by mouth 3 times daily as needed for Nausea or Vomiting    PEDIATRIC MULTIVITAMINS-IRON (CHILDRENS MULTIVITAMIN/IRON PO)    Take 1 tablet by mouth daily    PROMETHAZINE (PHENERGAN) 12.5 MG TABLET    Take 12.5 mg by mouth every 6 hours as needed for Nausea     SENNA (SENOKOT) 8.6 MG TABLET    Take 1 tablet by mouth as needed for Constipation    VITAMIN B-1 (THIAMINE) 100 MG TABLET    Take 100 mg by mouth daily     VITAMIN B-12 (CYANOCOBALAMIN) 1000 MCG TABLET    Place 1,000 mcg under the tongue once a week Indications: SUPPLEMENT     VITAMIN D PO    Take 50,000 Units by mouth once a week        ALLERGIES     is allergic to amoxicillin, pcn [penicillins], and zoloft [sertraline hcl].    FAMILY HISTORY     She indicated that her mother is deceased. She indicated that her father is alive. She indicated that her sister is alive. She indicated that her brother is alive. She indicated that her daughter is alive. She indicated that her son is alive. She indicated that the status of her other is unknown.   family history includes Breast Cancer in an other family member; Diabetes in her father; Heart Disease in her mother; High Blood Pressure in her father, mother, and sister; Mental Illness in her father, mother, and sister; Thyroid Disease in her mother and sister.    SOCIAL HISTORY      reports that she has never smoked. She has never used smokeless tobacco. She reports current alcohol use. She reports that she does not use drugs.    PHYSICAL EXAM     INITIAL VITALS:  weight is 188 lb (85.3 kg). Her oral temperature is 98.1 ??F (36.7 ??C). Her blood pressure is 141/90 (abnormal) and her pulse is 100. Her respiration is 16 and oxygen saturation is 100%.    Physical  Exam  Vitals and nursing note reviewed.   Constitutional:       Appearance: She is well-developed.   HENT:      Head: Normocephalic and atraumatic.   Eyes:      Conjunctiva/sclera: Conjunctivae normal.   Cardiovascular:      Rate and Rhythm: Normal rate and regular rhythm.      Heart sounds: Normal heart sounds. No murmur heard.  No friction rub.   Pulmonary:      Effort: Pulmonary effort is normal. No respiratory distress.  Breath sounds: Normal breath sounds.   Abdominal:      General: Bowel sounds are normal.      Palpations: Abdomen is soft.      Tenderness: There is abdominal tenderness in the epigastric area and periumbilical area. There is no guarding. Negative signs include Rovsing's sign and McBurney's sign.      Comments: Multiple 1 cm post-surgical scars across abdomen. Generalized abdominal tenderness to light palpation across epigastric and periumbilical regions.    Skin:     General: Skin is warm.   Neurological:      General: No focal deficit present.      Mental Status: She is alert and oriented to person, place, and time.   Psychiatric:         Mood and Affect: Mood normal.         DIFFERENTIAL DIAGNOSIS:   Differential diagnoses are discussed    DIAGNOSTIC RESULTS     EKG: All EKG's are interpreted by the Emergency Department Physician who either signs or Co-signsthis chart in the absence of a cardiologist.          RADIOLOGY: non-plain film images(s) such as CT, Ultrasound and MRI are read by the radiologist.    No orders to display       LABS:      Labs Reviewed   CBC WITH AUTO DIFFERENTIAL - Abnormal; Notable for the following components:       Result Value    WBC 3.6 (*)     RBC 3.84 (*)     Hemoglobin 10.5 (*)     Hematocrit 34.3 (*)     MCHC 30.6 (*)     RDW-SD 45.2 (*)     Segs Absolute 1.7 (*)     All other components within normal limits   COMPREHENSIVE METABOLIC PANEL W/ REFLEX TO MG FOR LOW K - Abnormal; Notable for the following components:    Total Bilirubin 0.2 (*)     ALT 10 (*)      All other components within normal limits   ANION GAP - Abnormal; Notable for the following components:    Anion Gap 7.0 (*)     All other components within normal limits   GLOMERULAR FILTRATION RATE, ESTIMATED - Abnormal; Notable for the following components:    Est, Glom Filt Rate 60 (*)     All other components within normal limits   LIPASE   LACTIC ACID   OSMOLALITY   URINALYSIS WITH REFLEX TO CULTURE   PTH, INTACT   TSH   T4, FREE       EMERGENCY DEPARTMENT COURSE:   Vitals:    Vitals:    01/28/21 1556   BP: (!) 141/90   Pulse: 100   Resp: 16   Temp: 98.1 ??F (36.7 ??C)   TempSrc: Oral   SpO2: 100%   Weight: 188 lb (85.3 kg)      5:42 PM EDT: The patient was seen and evaluated.    Patient presents with borderline tachycardia, mild hypertension and stable laboratory studies requesting assistance with electronic second consult.  She desires to find a new local GI provider and information for 1 will be provided with discharge paperwork.  The consult recommendations were reviewed and PTH/TSH were added here but still pending upon time of discharge.  As patient has no acute change in symptoms and available labs are without acute abnormality, she is agreeable with arranging appointment with family medicine provider as she does not  currently have 1 and outpatient GI referral.  Will give trial of Reglan as she has failed to respond to Zofran and Phenergan in the past and return precautions were discussed with the patient who denied further needs upon discharge.  Consult documentation was scanned into the media tab for future reference.    CRITICAL CARE:   None    CONSULTS:  None    PROCEDURES:  None    FINAL IMPRESSION      1. Chronic abdominal pain          DISPOSITION/PLAN   Discharge    PATIENT REFERRED TO:  No follow-up provider specified.    DISCHARGEMEDICATIONS:  New Prescriptions    No medications on file       (Please note that portions of this note were completedwith a voice recognition program.  Efforts were  made to edit the dictations but occasionally words are mis-transcribed.)        Ramiro Harvest, PA-C  02/03/21 1457

## 2021-01-28 NOTE — Telephone Encounter (Signed)
Pt left message to indicate she had completed second opinion and had questions, and would like [place] to review the findings.  I see she is ER [place] town.  I left a message to let her know, best to f/u w surgeon she sought second opinion from, but she can call me tomorrow to discuss .

## 2021-01-29 ENCOUNTER — Ambulatory Visit
Admit: 2021-01-29 | Discharge: 2021-01-29 | Payer: PRIVATE HEALTH INSURANCE | Attending: Student in an Organized Health Care Education/Training Program | Primary: Student in an Organized Health Care Education/Training Program

## 2021-01-29 DIAGNOSIS — R109 Unspecified abdominal pain: Secondary | ICD-10-CM

## 2021-01-29 DIAGNOSIS — G8929 Other chronic pain: Secondary | ICD-10-CM

## 2021-01-29 LAB — GLOMERULAR FILTRATION RATE, ESTIMATED
Est, Glom Filt Rate: 50 mL/min/{1.73_m2} — AB
Est, Glom Filt Rate: 62 mL/min/{1.73_m2} — AB

## 2021-01-29 NOTE — Progress Notes (Signed)
S: 40 y.o. female with   Chief Complaint   Patient presents with   ??? ED Follow-up     [facility] 01/28/2021 Follow-up Abdominal Pains also see Media 01/28/2021     Establishing care with new physician, etc. New patient, 1st time visit to [facility].     -ED f/u:  Has chronic abd pain  No taking any of her meds at the moment  Has had multiple abd surgeries, including rouen-y in 2014  abd is chronic, felt to be from scar tissue  Went to ER last night to ensure not obstructed  Labs and Ct abd/pelvis were reassuring.   Pain is tolerable today  Bowels moving  Has apt to see GI Thursday, [place].       BP Readings from Last 3 Encounters:   01/29/21 122/74   01/28/21 (!) 141/90   01/20/21 123/78     Wt Readings from Last 3 Encounters:   01/29/21 193 lb 9.6 oz (87.8 kg)   01/28/21 188 lb (85.3 kg)   01/20/21 194 lb 3.2 oz (88.1 kg)       O: VS:  height is 5' 4.5" (1.638 m) and weight is 193 lb 9.6 oz (87.8 kg). Her oral temperature is 98.2 ??F (36.8 ??C). Her blood pressure is 122/74 and her pulse is 78. Her respiration is 16 and oxygen saturation is 99%.   AAO/NAD, appropriate affect for mood  CV:  RRR, no murmur  Resp: CTAB    Admission on 01/28/2021, Discharged on 01/28/2021   Component Date Value Ref Range Status   ??? WBC 01/28/2021 3.6* 4.8 - 10.8 thou/mm3 Final   ??? RBC 01/28/2021 3.84* 4.20 - 5.40 mill/mm3 Final   ??? Hemoglobin 01/28/2021 10.5* 12.0 - 16.0 gm/dl Final   ??? Hematocrit 01/28/2021 34.3* 37.0 - 47.0 % Final   ??? MCV 01/28/2021 89.3  81.0 - 99.0 fL Final   ??? MCH 01/28/2021 27.3  26.0 - 33.0 pg Final   ??? MCHC 01/28/2021 30.6* 32.2 - 35.5 gm/dl Final   ??? RDW-CV 30/05/6225 13.9  11.5 - 14.5 % Final   ??? RDW-SD 01/28/2021 45.2* 35.0 - 45.0 fL Final   ??? Platelets 01/28/2021 359  130 - 400 thou/mm3 Final   ??? MPV 01/28/2021 9.5  9.4 - 12.4 fL Final   ??? Seg Neutrophils 01/28/2021 46.3  % Final   ??? Lymphocytes 01/28/2021 39.1  % Final   ??? Monocytes 01/28/2021 10.2  % Final   ??? Eosinophils 01/28/2021 3.0   % Final   ??? Basophils 01/28/2021 1.1  % Final   ??? Immature Granulocytes 01/28/2021 0.3  % Final   ??? Segs Absolute 01/28/2021 1.7* 1.8 - 7.7 thou/mm3 Final   ??? Lymphocytes Absolute 01/28/2021 1.4  1.0 - 4.8 thou/mm3 Final   ??? Monocytes Absolute 01/28/2021 0.4  0.4 - 1.3 thou/mm3 Final   ??? Eosinophils Absolute 01/28/2021 0.1  0.0 - 0.4 thou/mm3 Final   ??? Basophils Absolute 01/28/2021 0.0  0.0 - 0.1 thou/mm3 Final   ??? Immature Grans (Abs) 01/28/2021 0.01  0.00 - 0.07 thou/mm3 Final   ??? nRBC 01/28/2021 0  /100 wbc Final   ??? Glucose 01/28/2021 81  70 - 108 mg/dL Final   ??? CREATININE 01/28/2021 1.2  0.4 - 1.2 mg/dL Final   ??? BUN 33/35/4562 9  7 - 22 mg/dL Final   ??? Sodium 56/38/9373 140  135 - 145 meq/L Final   ??? Potassium reflex Magnesium 01/28/2021 4.6  3.5 - 5.2 meq/L Final   ??? Chloride 01/28/2021 106  98 - 111 meq/L Final   ??? CO2 01/28/2021 27  23 - 33 meq/L Final   ??? Calcium 01/28/2021 9.1  8.5 - 10.5 mg/dL Final   ??? AST 16/08/9603 13  5 - 40 U/L Final   ??? Alkaline Phosphatase 01/28/2021 70  38 - 126 U/L Final   ??? Total Protein 01/28/2021 6.5  6.1 - 8.0 g/dL Final   ??? Albumin 54/07/8118 3.6  3.5 - 5.1 g/dL Final   ??? Total Bilirubin 01/28/2021 0.2* 0.3 - 1.2 mg/dL Final   ??? ALT 14/78/2956 10* 11 - 66 U/L Final   ??? Lipase 01/28/2021 41.0  5.6 - 51.3 U/L Final   ??? Lactic Acid 01/28/2021 1.0  0.5 - 2.0 mmol/L Final   ??? Anion Gap 01/28/2021 7.0* 8.0 - 16.0 meq/L Final   ??? Est, Glom Filt Rate 01/28/2021 50* ml/min/1.20m2 Corrected   ??? Osmolality Calc 01/28/2021 277.1  275.0 - 300.0 mOsmol/kg Final   ??? Pth Intact 01/28/2021 75.7* 15.0 - 65.0 pg/mL Final   ??? TSH 01/28/2021 2.950  0.400 - 4.200 uIU/mL Final   ??? T4 Free 01/28/2021 1.30  0.93 - 1.76 ng/dL Final       Narrative   CT abdomen and pelvis with contrast   ??   Comparison: None   ??   Findings:   No consolidation or effusion.   ??   Cholecystectomy. Abdominal solid organs unremarkable. No nephrolithiasis.   No bowel obstruction, pneumoperitoneum, or pneumatosis.   ??    Hysterectomy. Ovaries are unremarkable.   Normal appendix.   No acute fracture.   Minimal soft tissue gas in the left upper quadrant subcutaneous fat along    with minor fat induration. Likely related to recent incision. There is no    fluid collection.   ??   ??   Impression   No bowel obstruction.   ??   This document has been electronically signed by: Salli Real,    MD on 01/03/2021 08:21 PM   ??   All CTs at this facility use dose modulation techniques and iterative    reconstructions, and/or weight-based dosing   when appropriate to reduce radiation to a low as reasonably achievable.        Diagnosis Orders   1. Chronic abdominal pain     2. Normocytic anemia         Plan  -chronic abd pain: tough case. Likely related to all prior surgeries. A definitive etiology likely won't be found. Will await GI rec's. Resume meds, including elavil, gaba, reglan and pepcid. Labs and CT reviewed above.   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 F/u bariatrics. May need f/u labs in the future.       Health Maintenance Due   Topic Date Due   ??? Hepatitis C screen  Never done   ??? Varicella vaccine (1 of 2 - 2-dose childhood series) Never done   ??? COVID-19 Vaccine (1) Never done   ??? Depression Screen  Never done   ??? HIV screen  Never done   ??? DTaP/Tdap/Td vaccine (1 - Tdap) Never done   ??? Flu vaccine (1) Never done         Attending Physician Statement  I have discussed the case, including pertinent history and exam findings with the resident. I also have seen the patient and performed key portions of the examination. I agree with the documented assessment and plan  as documented by the resident.  GC modifier added to this encounter      Stark Klein, DO 01/29/2021 3:59 PM

## 2021-01-29 NOTE — Patient Instructions (Addendum)
Can call [phone] for counselor     Thank you   1. Thank you for trusting [place] with your healthcare needs. You may receive a survey regarding today's visit. It would help [place] out if you would take a few moments to provide your feedback. We value your input.  2. Please bring in ALL medications BOTTLES, including inhalers, herbal supplements, over the counter, prescribed & non-prescribed medicine. The office would like actual medication bottles and a list.   3. Please note our OFFICE POLICIES:   a. Prior to getting your labs drawn, please check with your insurance company for benefits and eligibility of lab services. Often, insurance companies cover certain tests for preventative visits only. It is patient's responsibility to see what is covered.   b. We are unable to change a diagnosis after the test has been performed.   c. Lab orders will not be re-printed. Please hold onto your original lab orders and take them to your lab to be completed.   d. If you no show your scheduled appointment three times, you will be dismissed from this practice.   e. Reschedules must be completed 24 hours prior to your schedule appointment.   4. If the list below has been completed, PLEASE FAX RECORDS TO OUR OFFICE @ [phone]. Once the records have been received we will update your records at our office:  Health Maintenance Due   Topic Date Due   ??? Hepatitis C screen  Never done   ??? Varicella vaccine (1 of 2 - 2-dose childhood series) Never done   ??? COVID-19 Vaccine (1) Never done   ??? Depression Screen  Never done   ??? HIV screen  Never done   ??? DTaP/Tdap/Td vaccine (1 - Tdap) Never done   ??? Flu vaccine (1) Never done

## 2021-01-29 NOTE — Progress Notes (Signed)
Health Maintenance Due   Topic Date Due   ??? Hepatitis C screen  Never done   ??? Varicella vaccine (1 of 2 - 2-dose childhood series) Never done   ??? COVID-19 Vaccine (1) Never done Declined   ??? Depression Screen  Never done Completed    ??? HIV screen  Never done   ??? DTaP/Tdap/Td vaccine (1 - Tdap) Never done   ??? Flu vaccine (1) Never done Declined

## 2021-01-29 NOTE — Progress Notes (Signed)
[name] (DOB:  13-May-1981) is a 40 y.o. female,New patient, here for evaluation of the following chief complaint(s):  ED Follow-up [facility] 01/28/2021 Follow-up Abdominal Pains also see Media 01/28/2021)         ASSESSMENT/PLAN:  1. Chronic abdominal pain  2. Normocytic anemia     Recommend medication compliance and followup with specialists as directed.  Will see back In 2 weeks after further chart review    Return in about 2 weeks (around 02/12/2021).         Subjective   SUBJECTIVE/OBJECTIVE:  HPI    Chronic Abdominal Pain  Patient has extensive surgical history and was recently seen in ER for acute on chronic abdominal pain 2/2 constipation. In ER, had CT which appeared normal, no obstruction but continued constipation. Patient has seen GI for this before and did get medications rpescribed for this, but has been noncompliant.  Was given fiber, senna, dulcolax, and milk of mag and linzess.  Linzess started about a month ago and only medication she is on currently.      pain is currently an 3/10. Pain is a stabbing frequent pain. She denies any alleviating factors. Patient reports eating and drinking make the pain worse. She also reports frequent vomiting to nonspecific foods and beverages. Has been given reglan and zofran and only mild relief from these.  She states this produces a bowel movement but it is small and hard.    Of note,  Had intussception in November. Seen through [facility] at surgery, Dr. Denton Lank at GI (appointment tomorrow), and Dr. Allena Katz at cardiology.      Also has not been compliant on iron or vitamin D, advised     Review of Systems   Constitutional: Negative for appetite change and unexpected weight change.   HENT: Negative for congestion and hearing loss.    Eyes: Negative for pain and visual disturbance.   Respiratory: Negative for cough and shortness of breath.    Cardiovascular: Negative for chest pain and leg swelling.   Gastrointestinal: Positive for abdominal pain and nausea.  Negative for constipation and diarrhea.   Genitourinary: Negative for difficulty urinating, dysuria, hematuria and menstrual problem.   Musculoskeletal: Negative for arthralgias and myalgias.   Psychiatric/Behavioral: Negative for behavioral problems and sleep disturbance.          Objective   Physical Exam  Vitals and nursing note reviewed.   Constitutional:       General: She is not in acute distress.  HENT:      Head: Normocephalic and atraumatic.      Nose: Nose normal.      Mouth/Throat:      Mouth: Mucous membranes are moist.      Pharynx: Oropharynx is clear.   Eyes:      Extraocular Movements: Extraocular movements intact.      Pupils: Pupils are equal, round, and reactive to light.   Cardiovascular:      Rate and Rhythm: Normal rate and regular rhythm.      Pulses: Normal pulses.      Heart sounds: Normal heart sounds.   Pulmonary:      Effort: Pulmonary effort is normal.      Breath sounds: Normal breath sounds.   Abdominal:      Palpations: Abdomen is soft.      Tenderness: There is no abdominal tenderness.   Musculoskeletal:         General: No signs of injury. Normal range of motion.  Cervical back: Normal range of motion and neck supple.   Skin:     General: Skin is warm and dry.      Capillary Refill: Capillary refill takes less than 2 seconds.   Neurological:      General: No focal deficit present.      Mental Status: She is alert and oriented to person, place, and time. Mental status is at baseline.   Psychiatric:         Mood and Affect: Mood normal.         Behavior: Behavior normal.                  An electronic signature was used to authenticate this note.    --Jalene Mullet, MD

## 2021-02-06 NOTE — Telephone Encounter (Signed)
Pt returned call today.  Indicated DR Andres Shad said he would complete surgery if suggested by second opinion.   Advised, dont believe this was why he referred out but can ask him.  Confirmed w her, she did not actually see a Careers adviser.  She put information into a web site, called [facility].  Submitted questions and records and an opinion was rendered.   She believes DR Rockne Coons said he would do another surgery if indicated.  Advised I would ask him.  Said she had another call coming in, had to go.  Will advise once I confer w MP.

## 2021-02-06 NOTE — Telephone Encounter (Signed)
Reviewed w Dr Andres Shad.  Agree w J/J- but already did surgery.  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

## 2021-02-07 NOTE — Telephone Encounter (Signed)
Message sent.

## 2021-02-13 ENCOUNTER — Ambulatory Visit
Admit: 2021-02-13 | Discharge: 2021-02-13 | Payer: PRIVATE HEALTH INSURANCE | Attending: Student in an Organized Health Care Education/Training Program | Primary: Student in an Organized Health Care Education/Training Program

## 2021-02-13 DIAGNOSIS — G8929 Other chronic pain: Secondary | ICD-10-CM

## 2021-02-13 DIAGNOSIS — R109 Unspecified abdominal pain: Secondary | ICD-10-CM

## 2021-02-13 MED ORDER — GABAPENTIN 100 MG PO CAPS
100 MG | ORAL_CAPSULE | Freq: Every day | ORAL | 1 refills | Status: AC
Start: 2021-02-13 — End: 2021-08-12

## 2021-02-13 MED ORDER — AMITRIPTYLINE HCL 10 MG PO TABS
10 MG | ORAL_TABLET | Freq: Every evening | ORAL | 1 refills | Status: AC
Start: 2021-02-13 — End: 2023-04-21

## 2021-02-13 MED ORDER — VITAMIN B-12 1000 MCG PO TABS
1000 MCG | ORAL_TABLET | ORAL | 1 refills | Status: AC
Start: 2021-02-13 — End: ?

## 2021-02-13 NOTE — Telephone Encounter (Signed)
Yep! Weekly

## 2021-02-13 NOTE — Telephone Encounter (Signed)
Pharmacy called for clarification on the Vit B-12 Rx sent over today. They wanted to clarify if pt is to take weekly since the dispense amount was for 30 tabs. Please advise.

## 2021-02-13 NOTE — Progress Notes (Signed)
[name] (DOB:  11-23-1980) is a 40 y.o. female,Established patient, here for evaluation of the following chief complaint(s):  Medication Refill         ASSESSMENT/PLAN:  1. Chronic abdominal pain  Assessment & Plan:     Orders:  -     amitriptyline (ELAVIL) 10 MG tablet; Take 1 tablet by mouth nightly, Disp-90 tablet, R-1Normal  2. Numbness of fingers of both hands  Assessment & Plan:     Orders:  -     gabapentin (NEURONTIN) 100 MG capsule; Take 1 capsule by mouth daily for 180 days. Unsure of dose, Disp-90 capsule, R-1Normal  3. Intestinal malabsorption, unspecified type  -     vitamin B-12 (CYANOCOBALAMIN) 1000 MCG tablet; Take 1 tablet by mouth once a week Indications: SUPPLEMENT, Disp-30 tablet, R-1Normal     Continue current medications.  Improving.  Refills as above.     Discussed recent labs, encouraged increased hydration and taking supplements as prescribed.      Mood controlled, but asked to see therapist.  Pt will consider.        Return in about 2 months (around 04/15/2021).         Subjective   SUBJECTIVE/OBJECTIVE:  HPI     Since Last Visit  Saw Dr. Denton Lank cant do lap ERCP. Started on xifaxin.  BM improving on this.  Was going once a week now more like once every 3 days.  Planning to go to [facility] on these issues.      Has Chronic low back pain and has been on gabapentin for about a year but noncompliant.  Wants to refill this as it was helping.          PMH  Mood Disorder  Trying to stay busy with sons graduation party.      Mood - "still depressed". She feels lonely, sad, down. Down mood is moderate. Still has a feeling of "I'm not worthy". She is a little more positive however as she is seeing the future. No SI / HI. "I'm not as bad as I was".   ??  Shadows since 2010 "are still there". She reports at that time she wanted to end her life.  A week prior to her inpatient stay she tried to overdose on pills.  Sometimes she looks in her rear view mirror in the car and has to talk  herself out of that there is someone in her back seat. In the past she has thought she has heard a basketball in the middle of the night. She feels paranoid at times.  Difficulty falling asleep.      Has had palpitations 2/2 panic attacks vs. PTSD.  Seen by cardiology.  Cardiolite completed.      She states 7-06/2018 she did live in a homeless shelter but has since signed a lease and has an appropriate living situation. She states psychiatric hospitalization in 2017 was suicide attempt.     Treatment has involved IOP, celexa, hydroxazine, zoloft     She has a small support system. She considers a friend a hindrance. 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    Spoke w/ Dr. Andres Shad.  They are contemplating another surgery.      Went to ER for this recently.  Said she has no PCP (?)  or GI (?)    Had chronic abdominal pain 2/2 endometriosis (s/p hysterectomy in 2012) since at least 2013, before RYGB  Recent testing: UGI Completed at ED visit 12/2020/ no bowel obstruction.  EGD OSH 12/2019 [facility]    She was requesting an appointment with Dr. Alberteen Spindle - Not able to be seen.    Seeing Dr. Andres Shad (surgery) at [facility] for this.    Also seeing Dr. Allena Katz (GI) here for this  CT early March- normal.   Linzess has been improving over past month  Ongoing nausea, takes Zofran, little relief.  Phenergan works but causes fatigue.  Bentyl helps mostly, has upper abdominal cramping.  Elavil provided modest relief.    Denies GERD, but was to be on PPI for next 6 months  Any food or liquid will cause her stomach to cramp.    Ongoing constipation, takes stool softener, xifaxin, milk of mag, seen by GI in the past.       Patient states diet and exercise is going fairly well. Currently is  eating 65-75 gm/day protein, and is non compliant with prescribed multivitamins and  supplements.      Doesn't want to go back to bariatric clinic, has some outstanding labs.          Review of Systems   Constitutional: Negative for appetite change and unexpected weight change.   HENT: Negative for congestion and hearing loss.    Eyes: Negative for pain and visual disturbance.   Respiratory: Negative for cough and shortness of breath.    Cardiovascular: Negative for chest pain and leg swelling.   Gastrointestinal: Positive for abdominal pain, constipation, nausea and vomiting. Negative for diarrhea.   Genitourinary: Negative for difficulty urinating, dysuria, hematuria and vaginal pain.   Musculoskeletal: Negative for arthralgias and myalgias.   Psychiatric/Behavioral: Negative for behavioral problems, sleep disturbance and suicidal ideas.          Objective   Physical Exam  Vitals and nursing note reviewed.   Constitutional:       General: She is not in acute distress.  HENT:      Head: Normocephalic and atraumatic.      Nose: Nose normal.      Mouth/Throat:      Mouth: Mucous membranes are moist.      Pharynx: Oropharynx is clear.   Eyes:      Extraocular Movements: Extraocular movements intact.      Pupils: Pupils are equal, round, and reactive to light.   Cardiovascular:      Rate and Rhythm: Normal rate and regular rhythm.      Pulses: Normal pulses.      Heart sounds: Normal heart sounds.   Pulmonary:      Effort: Pulmonary effort is normal.      Breath sounds: Normal breath sounds.   Abdominal:      Palpations: Abdomen is soft.      Tenderness: There is no abdominal tenderness.   Musculoskeletal:         General: No signs of injury. Normal range of motion.      Cervical back: Normal range of motion and neck supple.   Skin:     General: Skin is warm and dry.      Capillary Refill: Capillary refill takes less than 2 seconds.   Neurological:      General: No focal deficit present.      Mental Status: She is alert and oriented to person, place, and time. Mental  status is at baseline.    Psychiatric:         Mood and Affect: Mood normal.         Behavior: Behavior normal.              An electronic signature was used to authenticate this note.    --Jalene Mullet, MD

## 2021-02-13 NOTE — Progress Notes (Signed)
S: 40 y.o. female with   Chief Complaint   Patient presents with   ??? Medication Refill       Chronic abdominal pain- saw GI and was started on Xifaxan. Initially thinking this is due to adhesions. Prior hx of RYGB. Xifaxan is helping with chronic constipation. Planning on going to see surgery for further evaluation.     Doesn't like to take bentyl, linzess, reglan, elavil.     Hx of mood disorder- on elavil around 50% of the time.    Hx of endometriosis. Hx of hysterectomy.     BP Readings from Last 3 Encounters:   02/13/21 120/68   01/29/21 122/74   01/28/21 (!) 141/90     Wt Readings from Last 3 Encounters:   02/13/21 195 lb (88.5 kg)   01/29/21 193 lb 9.6 oz (87.8 kg)   01/28/21 188 lb (85.3 kg)           O: VS:  height is 5\' 4"  (1.626 m) and weight is 195 lb (88.5 kg). Her oral temperature is 98.1 ??F (36.7 ??C). Her blood pressure is 120/68 and her pulse is 76. Her respiration is 20 and oxygen saturation is 99%.      Diagnosis Orders   1. Chronic abdominal pain     2. Numbness of fingers of both hands         Plan  Refill B12  Refill Gabapentin  Refill Elavil  F/u with GI and surgeon as previously scheduled.   RTC in 1 month.     Health Maintenance Due   Topic Date Due   ??? Hepatitis C screen  Never done   ??? Varicella vaccine (1 of 2 - 2-dose childhood series) Never done   ??? COVID-19 Vaccine (1) Never done   ??? HIV screen  Never done   ??? DTaP/Tdap/Td vaccine (1 - Tdap) Never done       I have discussed the case, including pertinent history and exam findings with the resident. I agree with the documented assessment and plan as documented by the resident.        Serrita Lueth, DO 02/13/2021 8:50 AM

## 2021-02-13 NOTE — Progress Notes (Signed)
S: 40 y.o. female with   Chief Complaint   Patient presents with   ??? Medication Refill       Chief complaint, HOPI, and all pertinent details of the case reviewed with the resident.    Please see resident's note for specific details discussed at today's visit. C/o constipation but doing better on new zyfaxin from GI.  She thinks her pain is from adhesions and is going to see surgeon again    BP Readings from Last 3 Encounters:   02/13/21 120/68   01/29/21 122/74   01/28/21 (!) 141/90     Wt Readings from Last 3 Encounters:   02/13/21 195 lb (88.5 kg)   01/29/21 193 lb 9.6 oz (87.8 kg)   01/28/21 188 lb (85.3 kg)       O: VS:  height is 5\' 4"  (1.626 m) and weight is 195 lb (88.5 kg). Her oral temperature is 98.1 ??F (36.7 ??C). Her blood pressure is 120/68 and her pulse is 76. Her respiration is 20 and oxygen saturation is 99%.      Diagnosis Orders   1. Chronic abdominal pain     2. Numbness of fingers of both hands         Plan:  Refill elavil, b12, gabapentin  Encourage to take multivitamin  F/u with surgeon  F/u here in 1 month    Health Maintenance Due   Topic Date Due   ??? Hepatitis C screen  Never done   ??? Varicella vaccine (1 of 2 - 2-dose childhood series) Never done   ??? COVID-19 Vaccine (1) Never done   ??? HIV screen  Never done   ??? DTaP/Tdap/Td vaccine (1 - Tdap) Never done       Attending Physician Statement  I have discussed the case, including pertinent history and exam findings with the resident.  I agree with the documented assessment and plan as documented by the resident.

## 2021-02-13 NOTE — Patient Instructions (Addendum)
[phone]  [phone] [id number]  [facility]     64 OZ of water a day.  Would recommend adding magnesium to daily regimen, which can be found in BodyArmor, Pedialyte or coconut water,  These have electrolytes which can increase the movement ability of the gut better than water or gatorade can. can add in fruit juices like prune juice every day, 4-8oz.  Space out drinks, just a sip every 30 minutes or so.  Adding in leafy vegatables such spinach.  Daily metamucil 17g in mornings. Can mix with either water or pedialyte.  Miralax daily if not having daily regular consistency stools.  Takes a day for miralax to work.   May need to add in senna if getting to day 2-3, which helps draw fluid into bowels.  If 3rd day without BM can add magnesium citrate (milk of magnesia) as it works fastest.

## 2021-02-13 NOTE — Telephone Encounter (Signed)
Pharmacy informed and verbalized understanding.

## 2021-02-25 ENCOUNTER — Ambulatory Visit
Admit: 2021-02-25 | Discharge: 2021-02-25 | Payer: PRIVATE HEALTH INSURANCE | Attending: Student in an Organized Health Care Education/Training Program | Primary: Student in an Organized Health Care Education/Training Program

## 2021-02-25 ENCOUNTER — Telehealth

## 2021-02-25 DIAGNOSIS — R109 Unspecified abdominal pain: Secondary | ICD-10-CM

## 2021-02-25 DIAGNOSIS — G8929 Other chronic pain: Secondary | ICD-10-CM

## 2021-02-25 NOTE — Telephone Encounter (Addendum)
LRYGB 10/09/13 MP  Diag lap 09/07/20 with LM  DOS 12/17/2020 REVISION OF JJ MP    Last OV: 01/20/2021 w/ MP 1 month POP, No BCC appointments scheduled cancelled per pt.      See pt message below - to NP for direction.

## 2021-02-25 NOTE — Telephone Encounter (Signed)
See TE 02/25/21 for pended referral order.

## 2021-02-25 NOTE — Progress Notes (Signed)
[name] is a 40 y.o. female who presents today for:  Chief Complaint   Patient presents with   ??? Referral - General       Goals    None         HPI:     Abdominal Pain  This is a chronic problem. The problem occurs constantly. The pain is located in the epigastric region, RUQ and LUQ. The pain is at a severity of 8/10. The pain is severe. The quality of the pain is cramping. The abdominal pain radiates to the back. Associated symptoms include belching, diarrhea, flatus and nausea. Pertinent negatives include no constipation, dysuria, hematochezia, hematuria, melena or vomiting. The pain is aggravated by eating. Relieved by: Sleep. Treatments tried: Xifaxan, Bentyl, Reglan, Senna, Pepcid. The treatment provided no relief. Prior diagnostic workup includes GI consult. Her past medical history is significant for abdominal surgery.     Wants a referral to [facility] surgery because of chronic abdominal pain following gastric bypass and revision.  She Ate some Chipotle yesterday and had diarrhea and abdominal pain. Got concerned and went to the emergency department. Work up in ED was unremarkable.    Current Outpatient Medications   Medication Sig Dispense Refill   ??? dicyclomine (BENTYL) 10 MG capsule Take 10 mg by mouth in the morning and at bedtime     ??? amitriptyline (ELAVIL) 10 MG tablet Take 1 tablet by mouth nightly 90 tablet 1   ??? gabapentin (NEURONTIN) 100 MG capsule Take 1 capsule by mouth daily for 180 days. Unsure of dose 90 capsule 1   ??? vitamin B-12 (CYANOCOBALAMIN) 1000 MCG tablet Take 1 tablet by mouth once a week Indications: SUPPLEMENT 30 tablet 1   ??? XIFAXAN 550 MG tablet Take 550 mg by mouth 3 times daily     ??? LINZESS 145 MCG capsule TAKE 1 CAPSULE BY MOUTH EVERY DAY ON AN EMPTY STOMACH     ??? metoclopramide (REGLAN) 10 MG tablet Take 1 tablet by mouth 4 times daily as needed (nausea/vomiting) 20 tablet 0   ??? senna (SENOKOT) 8.6 MG tablet Take 1 tablet by mouth as needed for Constipation      ???  Pediatric Multivitamins-Iron (CHILDRENS MULTIVITAMIN/IRON PO) Take 1 tablet by mouth daily      ??? calcium carbonate (TUMS) 500 MG chewable tablet Take 1 tablet by mouth in the morning, at noon, and at bedtime      ??? docusate sodium (STOOL SOFTENER) 100 MG capsule Take 100 mg by mouth as needed      ??? ondansetron (ZOFRAN-ODT) 4 MG disintegrating tablet Take 1 tablet by mouth 3 times daily as needed for Nausea or Vomiting 21 tablet 0   ??? famotidine (PEPCID) 40 MG tablet Take 1 tablet by mouth daily GERD 30 tablet 0   ??? Ferrous Sulfate (IRON PO) Take 15 mLs by mouth daily      ??? VITAMIN D PO Take 50,000 Units by mouth once a week      ??? vitamin B-1 (THIAMINE) 100 MG tablet Take 100 mg by mouth daily        No current facility-administered medications for this visit.          Food Insecurity: Food Insecurity Present   ??? Worried About Programme researcher, broadcasting/film/video in the Last Year: Sometimes true   ??? Ran Out of Food in the Last Year: Sometimes true       Health Maintenance   Topic Date Due   ???  Varicella vaccine (1 of 2 - 2-dose childhood series) Never done   ??? COVID-19 Vaccine (1) Never done   ??? HIV screen  Never done   ??? Hepatitis C screen  Never done   ??? DTaP/Tdap/Td vaccine (1 - Tdap) Never done   ??? Flu vaccine (Season Ended) 07/03/2021   ??? Depression Screen  01/29/2022   ??? Hepatitis A vaccine  Aged Out   ??? Hepatitis B vaccine  Aged Out   ??? Hib vaccine  Aged Out   ??? Meningococcal (ACWY) vaccine  Aged Out   ??? Pneumococcal 0-64 years Vaccine  Aged Out       ROS:      Review of Systems   Gastrointestinal: Positive for abdominal distention, abdominal pain, diarrhea, flatus and nausea. Negative for constipation, hematochezia, melena and vomiting.   Genitourinary: Negative for dysuria and hematuria.   Neurological: Positive for dizziness.     Objective:     Vitals:    02/25/21 0818   BP: 122/64   Site: Right Upper Arm   Position: Sitting   Cuff Size: Medium Adult   Pulse: 66   Resp: 16   Temp: 98.6 ??F (37 ??C)   SpO2: 98%   Weight:  202 lb 9.6 oz (91.9 kg)   Height: 5\' 4"  (1.626 m)       Body mass index is 34.78 kg/m??.    Wt Readings from Last 3 Encounters:   02/25/21 202 lb 9.6 oz (91.9 kg)   02/13/21 195 lb (88.5 kg)   01/29/21 193 lb 9.6 oz (87.8 kg)     BP Readings from Last 3 Encounters:   02/25/21 122/64   02/13/21 120/68   01/29/21 122/74       Physical Exam  Constitutional:       General: She is not in acute distress.     Appearance: Normal appearance. She is not ill-appearing.   HENT:      Head: Normocephalic and atraumatic.      Right Ear: External ear normal.      Left Ear: External ear normal.      Nose: Nose normal. No congestion or rhinorrhea.      Mouth/Throat:      Mouth: Mucous membranes are moist.   Eyes:      General: No scleral icterus.        Right eye: No discharge.         Left eye: No discharge.      Extraocular Movements: Extraocular movements intact.      Conjunctiva/sclera: Conjunctivae normal.      Pupils: Pupils are equal, round, and reactive to light.   Cardiovascular:      Rate and Rhythm: Normal rate and regular rhythm.      Heart sounds: Normal heart sounds. No murmur heard.      Pulmonary:      Effort: Pulmonary effort is normal. No respiratory distress.      Breath sounds: Normal breath sounds.   Abdominal:      General: Bowel sounds are normal. There is no distension.      Palpations: Abdomen is soft.      Tenderness: There is abdominal tenderness in the right upper quadrant, epigastric area, left upper quadrant and left lower quadrant. There is no guarding or rebound.   Musculoskeletal:         General: No swelling. Normal range of motion.      Cervical back: Normal range of motion and neck  supple.   Skin:     Findings: No erythema or rash.   Neurological:      General: No focal deficit present.      Mental Status: She is alert and oriented to person, place, and time. Mental status is at baseline.      Cranial Nerves: No cranial nerve deficit, dysarthria or facial asymmetry.   Psychiatric:         Mood and  Affect: Mood and affect normal.         Speech: Speech normal. She is communicative.         Behavior: Behavior normal. Behavior is cooperative.         Assessment / Plan:     1. Chronic abdominal pain  Dilana presents to for the evaluation of chronic abdominal pain.  Patient has been seen in the emergency department multiple times since her last appointment.  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  Unclear what is causing patient's abdominal pain.  This may be related to adhesions from her multiple abdominal surgeries that she has had over the years.  Patient desires referral to [facility] for further evaluation.  This was provided today.  Patient may also benefit from some lifestyle modifications and these were discussed with the patient.  Patient will follow up in clinic as needed for this problem.  - External Referral To General Surgery    2. History of gastric bypass  Given patient's extensive history of abdominal surgery and prior gastric bypass with revision, it would be appropriate for her to be evaluated by general surgeon.  - External Referral To General Surgery           Return if symptoms worsen or fail to improve.      Medications Prescribed:  No orders of the defined types were placed in this encounter.      No future appointments.    Patient given educational materials - see patient instructions.  Discussed use, benefit, and side effects of prescribed medications.  All patient questions answered.  Patient voiced understanding. Reviewed health maintenance.  Instructed to continue current medications, diet and exercise.  Patient agreed with treatment plan. Follow up as directed.     Electronically signed by Linus Salmons, MD on 02/25/2021 at 1:49 PM    This note was electronically signed. Parts of this note may have been dictated by use of voice recognition software and electronically transcribed. The note may contain errors not detected in proofreading.  Please refer to my supervising physician's documentation if my documentation differs.

## 2021-02-25 NOTE — Telephone Encounter (Signed)
[name]- please refer her to the Bariatric center at [facility]- thanks.  abd pain, s/p Roux en y/

## 2021-02-25 NOTE — Progress Notes (Signed)
SUBJECTIVE     [name] is a 40 y.o.female    Chief Complaint   Patient presents with   ??? Referral - General       Chief complaint, HOPI, and all pertinent details of the case reviewed with the resident.    Please see resident's note for specific details discussed at today's visit.    Pt presents with complaints of abdominal pain, which is chronic for her.  She has had hx of rouen y, cholecystectomy, hysterectomy, bariatric revision.  Recent CT abd was essentially normal without obstruction.  Bowels are now moving.  This episode of acute diarrhea and bloating occurred after eating chipotle.  Would like referral to gen surg for further eval.    Patient Active Problem List   Diagnosis   ??? Fatigue   ??? SOB (shortness of breath)   ??? Anemia   ??? Intestinal malabsorption   ??? Deficiency of multiple nutrient elements   ??? Lower abdominal pain   ??? Chronic abdominal pain   ??? Nausea   ??? Abnormal CT of the abdomen   ??? History of gastric bypass   ??? Obesity   ??? Dilation of small bowel anastomosis   ??? Constipation   ??? Low back pain   ??? Night sweats   ??? Numbness of fingers of both hands   ??? Endometriosis       Current Outpatient Medications   Medication Sig Dispense Refill   ??? dicyclomine (BENTYL) 10 MG capsule Take 10 mg by mouth in the morning and at bedtime     ??? amitriptyline (ELAVIL) 10 MG tablet Take 1 tablet by mouth nightly 90 tablet 1   ??? gabapentin (NEURONTIN) 100 MG capsule Take 1 capsule by mouth daily for 180 days. Unsure of dose 90 capsule 1   ??? vitamin B-12 (CYANOCOBALAMIN) 1000 MCG tablet Take 1 tablet by mouth once a week Indications: SUPPLEMENT 30 tablet 1   ??? XIFAXAN 550 MG tablet Take 550 mg by mouth 3 times daily     ??? LINZESS 145 MCG capsule TAKE 1 CAPSULE BY MOUTH EVERY DAY ON AN EMPTY STOMACH     ??? metoclopramide (REGLAN) 10 MG tablet Take 1 tablet by mouth 4 times daily as needed (nausea/vomiting) 20 tablet 0   ??? senna (SENOKOT) 8.6 MG tablet Take 1 tablet by mouth as needed for Constipation      ???  Pediatric Multivitamins-Iron (CHILDRENS MULTIVITAMIN/IRON PO) Take 1 tablet by mouth daily      ??? calcium carbonate (TUMS) 500 MG chewable tablet Take 1 tablet by mouth in the morning, at noon, and at bedtime      ??? docusate sodium (STOOL SOFTENER) 100 MG capsule Take 100 mg by mouth as needed      ??? ondansetron (ZOFRAN-ODT) 4 MG disintegrating tablet Take 1 tablet by mouth 3 times daily as needed for Nausea or Vomiting 21 tablet 0   ??? famotidine (PEPCID) 40 MG tablet Take 1 tablet by mouth daily GERD 30 tablet 0   ??? Ferrous Sulfate (IRON PO) Take 15 mLs by mouth daily      ??? VITAMIN D PO Take 50,000 Units by mouth once a week      ??? vitamin B-1 (THIAMINE) 100 MG tablet Take 100 mg by mouth daily        No current facility-administered medications for this visit.       Review of Systems per Dr. Abelina Bachelor    OBJECTIVE  BP 122/64 (Site: Right Upper Arm, Position: Sitting, Cuff Size: Medium Adult)    Pulse 66    Temp 98.6 ??F (37 ??C)    Resp 16    Ht 5\' 4"  (1.626 m)    Wt 202 lb 9.6 oz (91.9 kg)    SpO2 98%    BMI 34.78 kg/m??   BP Readings from Last 3 Encounters:   02/25/21 122/64   02/13/21 120/68   01/29/21 122/74       Wt Readings from Last 3 Encounters:   02/25/21 202 lb 9.6 oz (91.9 kg)   02/13/21 195 lb (88.5 kg)   01/29/21 193 lb 9.6 oz (87.8 kg)     Body mass index is 34.78 kg/m??.    Physical Exam per Dr. 01/31/21    No results found for: LABA1C    Lab Results   Component Value Date    CHOL 152 09/07/2015    TRIG 61 09/07/2015    HDL 65 (H) 09/07/2015       The ASCVD Risk score (Goff DC Jr., et al., 2013) failed to calculate for the following reasons:    The 2013 ASCVD risk score is only valid for ages 12 to 67    Lab Results   Component Value Date    NA 140 01/28/2021    K 4.6 01/28/2021    CL 106 01/28/2021    CO2 27 01/28/2021    BUN 9 01/28/2021    CREATININE 1.2 01/28/2021    GLUCOSE 81 01/28/2021    CALCIUM 9.1 01/28/2021    PROT 6.5 01/28/2021    LABALBU 3.6 01/28/2021    BILITOT 0.2 (L) 01/28/2021     ALKPHOS 70 01/28/2021    AST 13 01/28/2021    ALT 10 (L) 01/28/2021    LABGLOM 50 (A) 01/28/2021       Lab Results   Component Value Date    TSH 2.950 01/28/2021    T3TOTAL 93 01/23/2016    T4FREE 1.30 01/28/2021       Lab Results   Component Value Date    WBC 3.6 (L) 01/28/2021    HGB 10.5 (L) 01/28/2021    HCT 34.3 (L) 01/28/2021    MCV 89.3 01/28/2021    PLT 359 01/28/2021         No future appointments.    ASSESSMENT       Diagnosis Orders   1. Chronic abdominal pain  External Referral To General Surgery   2. History of gastric bypass  External Referral To General Surgery       PLAN      After discussion with Dr. 01/30/2021, we agreed on plan as follows:    1. Discussion with patient that unfortunately these are likely chronic issues related to her multiple abdominal surgeries / adhesions.  Work on increase fluids, healthy diet, exercise. Small meals frequently.    2. Will refer to gen surg per patient's request  3. Labs/imaging reviewed.           Attending Physician Statement  I have discussed the case, including pertinent history and exam findings with the resident.  I agree with the documented assessment and plan as documented by the resident.  GE modifier added  to this encounter        Electronically signed by Abelina Bachelor, MD on 02/25/2021 at 8:59 AM

## 2021-02-25 NOTE — Telephone Encounter (Signed)
Signed.

## 2021-02-25 NOTE — Progress Notes (Signed)
[facility]  [address]  [place] [address]  Dept: [phone]  Loc: [phone]      [name] (DOB:  Aug 03, 1981) is a 40 y.o. female, here for evaluation of the following chief complaint(s):  Referral - General    JR precepting note    ASSESSMENT/PLAN:  1. Chronic abdominal pain  -     External Referral To General Surgery  2. History of gastric bypass  -     External Referral To General Surgery    Source of pain is likely her multiple abdominal surgeries causing scar tissue.   Reassurance today that her CT scans are normal.   Okay to refer to gen surg however pt is advised her pain with likely be chronic and that lifestyle changes will be best for her to prevent other causes of pain.    SUBJECTIVE/OBJECTIVE:  HPI     Hx of gastric bypass with revision in February of this year.  Went to ED yesterday with diarrhea after eating Chipotle.  She wants to be referred to general surgery for more workup. Has a history of cholecystectomy but still having RUQ pain.       Review of Systems per Dr. Enis Slipper note    Physical Exam per Dr. Enis Slipper note      Vitals:    02/25/21 0818   BP: 122/64   Site: Right Upper Arm   Position: Sitting   Cuff Size: Medium Adult   Pulse: 66   Resp: 16   Temp: 98.6 ??F (37 ??C)   SpO2: 98%   Weight: 202 lb 9.6 oz (91.9 kg)   Height: 5\' 4"  (1.626 m)         An electronic signature was used to authenticate this note.    -- , MD

## 2021-02-25 NOTE — Telephone Encounter (Signed)
Referral pended.

## 2021-02-26 NOTE — Telephone Encounter (Signed)
Referral order faxed to [phone] along with snapshot, last OV note and op notes.

## 2021-03-06 ENCOUNTER — Encounter
Attending: Student in an Organized Health Care Education/Training Program | Primary: Student in an Organized Health Care Education/Training Program

## 2021-03-18 ENCOUNTER — Encounter
Payer: PRIVATE HEALTH INSURANCE | Attending: Nephrology | Primary: Student in an Organized Health Care Education/Training Program

## 2021-04-16 ENCOUNTER — Encounter
Attending: Student in an Organized Health Care Education/Training Program | Primary: Student in an Organized Health Care Education/Training Program

## 2023-04-16 ENCOUNTER — Inpatient Hospital Stay
Admit: 2023-04-16 | Discharge: 2023-04-16 | Disposition: A | Discharge status: Home / Self Care | Payer: PRIVATE HEALTH INSURANCE | Attending: Emergency Medicine

## 2023-04-16 ENCOUNTER — Emergency Department: Admit: 2023-04-16 | Payer: PRIVATE HEALTH INSURANCE

## 2023-04-16 DIAGNOSIS — J029 Acute pharyngitis, unspecified: Secondary | ICD-10-CM

## 2023-04-16 LAB — CBC WITH AUTO DIFFERENTIAL
Basophils Absolute: 0.1 10*3/uL (ref 0.0–0.1)
Basophils: 1 %
Eosinophils Absolute: 0.2 10*3/uL (ref 0.0–0.4)
Eosinophils: 4.5 %
Hematocrit: 32.7 % — ABNORMAL LOW (ref 37.0–47.0)
Hemoglobin: 10 gm/dl — ABNORMAL LOW (ref 12.0–16.0)
Immature Grans (Abs): 0.03 10*3/uL (ref 0.00–0.07)
Immature Granulocytes %: 0.6 %
Lymphocytes Absolute: 1.8 10*3/uL (ref 1.0–4.8)
Lymphocytes: 35.3 %
MCH: 26.9 pg (ref 26.0–33.0)
MCHC: 30.6 gm/dl — ABNORMAL LOW (ref 32.2–35.5)
MCV: 87.9 fL (ref 81.0–99.0)
MPV: 10 fL (ref 9.4–12.4)
Monocytes %: 8.4 %
Monocytes Absolute: 0.4 10*3/uL (ref 0.4–1.3)
Neutrophils Absolute: 2.6 10*3/uL (ref 1.8–7.7)
Platelets: 402 10*3/uL — ABNORMAL HIGH (ref 130–400)
RBC: 3.72 10*6/uL — ABNORMAL LOW (ref 4.20–5.40)
RDW-CV: 14.5 % (ref 11.5–14.5)
RDW-SD: 46.7 fL — ABNORMAL HIGH (ref 35.0–45.0)
Seg Neutrophils: 50.2 %
WBC: 5.1 10*3/uL (ref 4.8–10.8)
nRBC: 0 /100 wbc

## 2023-04-16 LAB — BASIC METABOLIC PANEL W/ REFLEX TO MG FOR LOW K
BUN: 10 mg/dL (ref 7–22)
CO2: 22 meq/L — ABNORMAL LOW (ref 23–33)
Calcium: 8.3 mg/dL — ABNORMAL LOW (ref 8.5–10.5)
Chloride: 105 meq/L (ref 98–111)
Creatinine: 1 mg/dL (ref 0.4–1.2)
Glucose: 83 mg/dL (ref 70–108)
Potassium reflex Magnesium: 4.3 meq/L (ref 3.5–5.2)
Sodium: 139 meq/L (ref 135–145)

## 2023-04-16 LAB — GROUP A STREP, REFLEX: Rapid Strep A Screen: NEGATIVE

## 2023-04-16 LAB — URINE WITH REFLEXED MICRO
Bacteria, UA: NONE SEEN /hpf
Bilirubin, Urine: NEGATIVE
Blood, Urine: NEGATIVE
CASTS 2: NONE SEEN /lpf
Casts UA: >15 /lpf
Crystals, UA: NONE SEEN
Glucose, Ur: NEGATIVE mg/dl
MISCELLANEOUS 2: NONE SEEN
Nitrite, Urine: NEGATIVE
Protein, UA: NEGATIVE
Renal Epithelial, UA: NONE SEEN
Specific Gravity, UA: >1.03 — AB (ref 1.002–1.030)
Urobilinogen, Urine: 1 eu/dl (ref 0.0–1.0)
Yeast, UA: NONE SEEN
pH, Urine: 5.5 (ref 5.0–9.0)

## 2023-04-16 LAB — EKG 12-LEAD
Atrial Rate: 76 {beats}/min
P Axis: 71 degrees
P-R Interval: 154 ms
Q-T Interval: 376 ms
QRS Duration: 82 ms
QTc Calculation (Bazett): 423 ms
R Axis: 33 degrees
T Axis: 46 degrees
Ventricular Rate: 76 {beats}/min

## 2023-04-16 LAB — C-REACTIVE PROTEIN: CRP: 1.04 mg/dl — ABNORMAL HIGH (ref 0.00–1.00)

## 2023-04-16 LAB — COVID-19 & INFLUENZA COMBO
Influenza A: NOT DETECTED
Influenza B: NOT DETECTED
SARS-CoV-2 RNA, RT PCR: NOT DETECTED

## 2023-04-16 LAB — TROPONIN: Troponin, High Sensitivity: <6 ng/L (ref 0–12)

## 2023-04-16 LAB — GLOMERULAR FILTRATION RATE, ESTIMATED: Est, Glom Filt Rate: 72 mL/min/{1.73_m2} (ref >60)

## 2023-04-16 LAB — BRAIN NATRIURETIC PEPTIDE: Pro-BNP: 120.5 pg/mL (ref 0.0–124.0)

## 2023-04-16 LAB — OSMOLALITY: Osmolality Calc: 275.7 mOsmol/kg (ref 275.0–300.0)

## 2023-04-16 LAB — ANION GAP: Anion Gap: 12 meq/L (ref 8.0–16.0)

## 2023-04-16 MED ORDER — FLUTICASONE PROPIONATE 50 MCG/ACT NA SUSP
50 | Freq: Every day | NASAL | 0 refills | Status: AC
Start: 2023-04-16 — End: ?

## 2023-04-16 MED ORDER — NITROFURANTOIN MONOHYD MACRO 100 MG PO CAPS
100 | ORAL_CAPSULE | Freq: Two times a day (BID) | ORAL | 0 refills | Status: AC
Start: 2023-04-16 — End: 2023-04-23

## 2023-04-16 MED ORDER — BENZONATATE 100 MG PO CAPS
100 | ORAL_CAPSULE | Freq: Three times a day (TID) | ORAL | 0 refills | Status: AC | PRN
Start: 2023-04-16 — End: 2023-04-23

## 2023-04-16 NOTE — ED Notes (Signed)
Patient presents to the ED with complaints of having chest pain, SOB and back pain that travels down both of her legs. She states that her symptoms started approximately 3 days ago and has been getting worse. She has no other complaints at this time.

## 2023-04-16 NOTE — ED Provider Notes (Signed)
[facility]  EMERGENCY DEPARTMENT ENCOUNTER        PATIENT NAME: [name]  MRN: [id number]  DOB: 02-Dec-1980  DOE: 04/16/2023  PROVIDER: Burna Cash, MD    Patient was seen and evaluated at 2:57 PM EDT. Nurses Notes are reviewed and I agree except as noted in the HPI.  Chief Complaint   Patient presents with    Chest Pain    Pharyngitis    Back Pain     HISTORY OF PRESENT ILLNESS     [name] is a 42 y.o. female presenting for evaluation of sore throat, hoarseness, chest pain, and back pain.    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  Patient states over the last 4 days, she has been feeling sore throat, fatigue, gradual loss of voice, chest pain, and lower back pain radiating to bilateral hips.  Patient feels she is broken into pieces.  She reports no fever or chills.  No chest pain.  No SOB.  Appetite has been poor.  No nausea or vomiting.  No hematuria or dysuria.  No diarrhea.  No leg swelling.    This HPI was provided by patient.     PAST MEDICAL HISTORY    has a past medical history of Anemia, Chronic back pain, Deficiency of multiple nutrient elements, Deficiency of multiple vitamins, Fatigue, GERD (gastroesophageal reflux disease), Hypoglycemia, Intestinal malabsorption, Irregular menstrual cycle, Knee pain, Partial small bowel obstruction (HCC), Post-op pain, SBO (small bowel obstruction) (HCC), Sleep apnea, and SOB (shortness of breath).    SURGICAL HISTORY      has a past surgical history that includes Cesarean section (11/03/2003); Hysterectomy (11/02/2010); salpingectomy (11/02/2006); Cholecystectomy (11/02/2004); Gastric bypass surgery (10/09/2013); Breast lumpectomy (Left, 11/02/1998); other surgical history (09/07/2020); Endoscopy, colon, diagnostic; Colonoscopy; Ectopic pregnancy surgery; polypectomy; and Jejunostomy (12/17/2020).    CURRENT MEDICATIONS       Previous Medications    AMITRIPTYLINE (ELAVIL) 10 MG TABLET    Take  1 tablet by mouth nightly    CALCIUM CARBONATE (TUMS) 500 MG CHEWABLE TABLET    Take 1 tablet by mouth in the morning, at noon, and at bedtime     DICYCLOMINE (BENTYL) 10 MG CAPSULE    Take 10 mg by mouth in the morning and at bedtime    DOCUSATE SODIUM (STOOL SOFTENER) 100 MG CAPSULE    Take 100 mg by mouth as needed     FAMOTIDINE (PEPCID) 40 MG TABLET    Take 1 tablet by mouth daily GERD    FERROUS SULFATE (IRON PO)    Take 15 mLs by mouth daily     GABAPENTIN (NEURONTIN) 100 MG CAPSULE    Take 1 capsule by mouth daily for 180 days. Unsure of dose    LINZESS 145 MCG CAPSULE    TAKE 1 CAPSULE BY MOUTH EVERY DAY ON AN EMPTY STOMACH    METOCLOPRAMIDE (REGLAN) 10 MG TABLET    Take 1 tablet by mouth 4 times daily as needed (nausea/vomiting)    ONDANSETRON (ZOFRAN-ODT) 4 MG DISINTEGRATING TABLET    Take 1 tablet by mouth 3 times daily as needed for Nausea or Vomiting    PEDIATRIC MULTIVITAMINS-IRON (CHILDRENS MULTIVITAMIN/IRON PO)    Take 1 tablet by mouth daily     SENNA (SENOKOT) 8.6 MG TABLET    Take 1 tablet by mouth as needed for Constipation     VITAMIN B-1 (THIAMINE) 100 MG TABLET  Take 100 mg by mouth daily     VITAMIN B-12 (CYANOCOBALAMIN) 1000 MCG TABLET    Take 1 tablet by mouth once a week Indications: SUPPLEMENT    VITAMIN D PO    Take 50,000 Units by mouth once a week     XIFAXAN 550 MG TABLET    Take 550 mg by mouth 3 times daily       ALLERGIES     is allergic to amoxicillin, pcn [penicillins], and zoloft [sertraline hcl].    FAMILY HISTORY     She indicated that her mother is deceased. She indicated that her father is alive. She indicated that her sister is alive. She indicated that her brother is alive. She indicated that her daughter is alive. She indicated that her son is alive. She indicated that the status of her other is unknown.   family history includes Breast Cancer in an other family member; Diabetes in her father; Heart Disease in her mother; High Blood Pressure in her father, mother, and  sister; Mental Illness in her father, mother, and sister; Thyroid Disease in her mother and sister.    SOCIAL HISTORY      reports that she has never smoked. She has never used smokeless tobacco. She reports current alcohol use. She reports that she does not use drugs.    PHYSICAL EXAM      height is 1.626 m (5\' 4" ) and weight is 90.7 kg (200 lb). Her blood pressure is 114/80 and her pulse is 78. Her respiration is 18 and oxygen saturation is 100%.   Physical Exam  Vitals and nursing note reviewed.   Constitutional:       Appearance: Normal appearance. She is well-developed.   HENT:      Head: Normocephalic and atraumatic.      Nose: Nose normal.      Mouth/Throat:      Mouth: Mucous membranes are moist.      Pharynx: Posterior oropharyngeal erythema present.      Comments:   Patent airway, tonsils are not enlarged, moderate erythema from oropharynx, without tonsillar exudates.  Patient has hoarseness.  Eyes:      General: No scleral icterus.        Right eye: No discharge.         Left eye: No discharge.      Pupils: Pupils are equal, round, and reactive to light.   Neck:      Trachea: No tracheal deviation.   Cardiovascular:      Rate and Rhythm: Normal rate and regular rhythm.      Heart sounds: Normal heart sounds. No murmur heard.  Pulmonary:      Effort: Pulmonary effort is normal. No respiratory distress.      Breath sounds: No stridor. No wheezing, rhonchi or rales.      Comments:   Lungs are clear to auscultation bilaterally.  Abdominal:      General: Bowel sounds are normal. There is no distension.      Palpations: Abdomen is soft.      Tenderness: There is no abdominal tenderness. There is no guarding or rebound.      Comments:   Abdomen is nondistended, soft abdomen without tenderness, no guarding or rebound pain.  No CVA tenderness.   Musculoskeletal:         General: No swelling or tenderness.      Cervical back: Normal range of motion and neck supple.   Skin:  General: Skin is warm and dry.    Neurological:      Mental Status: She is alert and oriented to person, place, and time.      Cranial Nerves: No cranial nerve deficit.      Sensory: No sensory deficit.      Motor: No weakness.   Psychiatric:         Behavior: Behavior normal.         Thought Content: Thought content normal.         Judgment: Judgment normal.       FORMAL DIAGNOSTIC RESULTS   RADIOLOGY: Interpretation per the Radiologist below, if available at the time of this note (none if blank):  XR CHEST PORTABLE   Final Result   Stable radiographic appearance of the chest. No evidence of an   acute process.               **This report has been created using voice recognition software. It may contain   minor errors which are inherent in voice recognition technology.**      Electronically signed by Dr. Sherre Poot        EKG: (Interpreted by me)  Sinus rhythm, VR 76, PR 154, QRS 82, QT 423, no acute ischemic changes    LABS: (None if blank)  Results for orders placed or performed during the hospital encounter of 04/16/23   COVID-19 & Influenza Combo    Specimen: Nasopharyngeal Swab   Result Value Ref Range    SARS-CoV-2 RNA, RT PCR NOT DETECTED NOT DETECTED    Influenza A NOT DETECTED NOT DETECTED    Influenza B NOT DETECTED NOT DETECTED   Brain Natriuretic Peptide   Result Value Ref Range    Pro-BNP 120.5 0.0 - 124.0 pg/mL   Basic Metabolic Panel w/ Reflex to MG   Result Value Ref Range    Sodium 139 135 - 145 meq/L    Potassium reflex Magnesium 4.3 3.5 - 5.2 meq/L    Chloride 105 98 - 111 meq/L    CO2 22 (L) 23 - 33 meq/L    Glucose 83 70 - 108 mg/dL    BUN 10 7 - 22 mg/dL    Creatinine 1.0 0.4 - 1.2 mg/dL    Calcium 8.3 (L) 8.5 - 10.5 mg/dL   CBC with Auto Differential   Result Value Ref Range    WBC 5.1 4.8 - 10.8 thou/mm3    RBC 3.72 (L) 4.20 - 5.40 mill/mm3    Hemoglobin 10.0 (L) 12.0 - 16.0 gm/dl    Hematocrit 44.0 (L) 37.0 - 47.0 %    MCV 87.9 81.0 - 99.0 fL    MCH 26.9 26.0 - 33.0 pg    MCHC 30.6 (L) 32.2 - 35.5 gm/dl    RDW-CV 10.2  72.5 - 14.5 %    RDW-SD 46.7 (H) 35.0 - 45.0 fL    Platelets 402 (H) 130 - 400 thou/mm3    MPV 10.0 9.4 - 12.4 fL    Seg Neutrophils 50.2 %    Lymphocytes 35.3 %    Monocytes % 8.4 %    Eosinophils 4.5 %    Basophils 1.0 %    Immature Granulocytes % 0.6 %    Neutrophils Absolute 2.6 1.8 - 7.7 thou/mm3    Lymphocytes Absolute 1.8 1.0 - 4.8 thou/mm3    Monocytes Absolute 0.4 0.4 - 1.3 thou/mm3    Eosinophils Absolute 0.2 0.0 - 0.4 thou/mm3    Basophils Absolute  0.1 0.0 - 0.1 thou/mm3    Immature Grans (Abs) 0.03 0.00 - 0.07 thou/mm3    nRBC 0 /100 wbc   Troponin   Result Value Ref Range    Troponin, High Sensitivity < 6 0 - 12 ng/L   Group A Strep, Reflex   Result Value Ref Range    Rapid Strep A Screen Negative NEGATIVE    REFLEX THROAT C + S INDICATED    Urine with Reflexed Micro   Result Value Ref Range    Glucose, Ur NEGATIVE NEGATIVE mg/dl    Bilirubin, Urine NEGATIVE NEGATIVE    Ketones, Urine TRACE (A) NEGATIVE    Specific Gravity, UA >1.030 (A) 1.002 - 1.030    Blood, Urine NEGATIVE NEGATIVE    pH, Urine 5.5 5.0 - 9.0    Protein, UA NEGATIVE NEGATIVE    Urobilinogen, Urine 1.0 0.0 - 1.0 eu/dl    Nitrite, Urine NEGATIVE NEGATIVE    Leukocyte Esterase, Urine MODERATE (A) NEGATIVE    Color, UA YELLOW STRAW-YELLOW    Character, Urine CLOUDY (A) CLEAR-SL CLOUD   Anion Gap   Result Value Ref Range    Anion Gap 12.0 8.0 - 16.0 meq/L   Glomerular Filtration Rate, Estimated   Result Value Ref Range    Est, Glom Filt Rate 72 >60 ml/min/1.96m2   Osmolality   Result Value Ref Range    Osmolality Calc 275.7 275.0 - 300.0 mOsmol/kg   EKG 12 Lead   Result Value Ref Range    Ventricular Rate 76 BPM    Atrial Rate 76 BPM    P-R Interval 154 ms    QRS Duration 82 ms    Q-T Interval 376 ms    QTc Calculation (Bazett) 423 ms    P Axis 71 degrees    R Axis 33 degrees    T Axis 46 degrees       (Any cultures that may have been sent were not resulted at the time of this patient visit)    ED COURSE AND MEDICAL DECISION MAKING (MDM)      ROKEISHA BEITH is a 42 y.o. female presenting for evaluation of sore throat, hoarseness, chest pain, and back pain.    Differential Diagnosis: Viral illness, laryngitis, strep pharyngitis, bronchitis/pneumonia, UTI, anxiety  Initial plan: Labs including CBC, BMP, troponin, BNP, COVID-19, influenza, GAS, and UA.  Chest x-ray    ED workups are reassuring.  UA has mild UTI changes.  Negative strep, negative COVID-19, and negative influenza screening.  CBC and BMP are unremarkable.  Clinically I suspect the patient has viral illness causing pharyngitis laryngitis.  Patient is reassured.  I recommended supportive care for suspected viral infection.  Regarding her UTI, I recommend a course of nitrofurantoin.  Discharged with prescriptions of nitrofurantoin, Tessalon Perles, and Flonase.  PCP follow-up in 1 week.    ED MEDICATIONS:  (None if blank)  Medications - No data to display  CONSULTANTS:  None    PROCEDURES:   None    CRITICAL CARE:   None    Vitals:    04/16/23 1426   BP: 114/80   Pulse: 78   Resp: 18   TempSrc: Oral   SpO2: 100%   Weight: 90.7 kg (200 lb)   Height: 1.626 m (5\' 4" )       NUMBER AND COMPLEXITY OF PROBLEMS        Problem List This Visit: Sore throat, hoarseness, chest pain, back pain, anxiety  Pertinent Comorbid Conditions:  See HPI, PMH and PSH    DATA REVIEWED(none if left blank)    Code Status:  Reviewed with patient and/or family as full code    External Documentation Reviewed: Relevant record in care everywhere is reviewed (If there is any).    Previous relevant patient encounter documents & history available on EMR was reviewed: Yes (If there is any)    See Formal Diagnostic Results above for the lab and radiology tests and orders.    Shared Decision-Making: ED workups, treatment plan and dispositions are discussed with the patient/family, questions answered     FINAL IMPRESSION AND DISPOSITION     1. Acute pharyngitis, unspecified etiology    2. Urinary tract infection in female    3.  Laryngitis        DISPOSITION Decision To Discharge 04/16/2023 06:33:23 PM      OUTPATIENT FOLLOW UP THE PATIENT:  Percell Boston, DO  [address]  [address]  [place] [address]  [phone]    In 1 week  ED discharge follow-up      DISCHARGE MEDICATIONS:(None if blank)  New Prescriptions    BENZONATATE (TESSALON PERLES) 100 MG CAPSULE    Take 1 capsule by mouth 3 times daily as needed for Cough    FLUTICASONE (FLONASE) 50 MCG/ACT NASAL SPRAY    2 sprays by Each Nostril route daily    NITROFURANTOIN, MACROCRYSTAL-MONOHYDRATE, (MACROBID) 100 MG CAPSULE    Take 1 capsule by mouth 2 times daily for 7 days       Burna Cash, MD        Burna Cash, MD  04/16/23 [phone]

## 2023-04-17 LAB — SEDIMENTATION RATE: Sed Rate, Automated: 27 mm/hr — ABNORMAL HIGH (ref 0–20)

## 2023-04-18 LAB — CULTURE, REFLEXED, URINE

## 2023-04-18 LAB — CULTURE, THROAT: Throat/Nose Culture: NORMAL

## 2023-04-19 NOTE — Telephone Encounter (Signed)
Left VM for pt to return call.

## 2023-04-19 NOTE — Telephone Encounter (Signed)
-----   Message from [place] sent at 04/19/2023 10:43 AM EDT -----  Regarding: RE: ED Follow Up  Contact: [phone]  Please call the patient to schedule an appointment to be seen. She needs to be evaluated prior to sending any prescriptions in. Thanks!    ----- Message -----  From: Drue Novel, MA  Sent: 04/19/2023  10:35 AM EDT  To: Percell Boston, DO  Subject: FW: ED Follow Up                                   ----- Message -----  From: [name]  Sent: 04/19/2023  10:34 AM EDT  To: [facility] Clinical Staff  Subject: ED Follow Up                                     Good morning, I was unable to get my prescription. My son said they never called it in.., I haven't been able to go because I just started my job and can't miss time I work 7am-7pm. 'm off tomorrow so i was going to try and get it all handled. I would do the revisit but I have a bunch of other issues if like to speak with someone about. I was going to call and make an appointment. You so much for following up... I'm the meantime are to able to send the pharmacy my prescriptions?

## 2023-04-21 ENCOUNTER — Inpatient Hospital Stay: Payer: PRIVATE HEALTH INSURANCE

## 2023-04-21 ENCOUNTER — Inpatient Hospital Stay: Admit: 2023-04-21 | Payer: PRIVATE HEALTH INSURANCE

## 2023-04-21 ENCOUNTER — Ambulatory Visit: Admit: 2023-04-21 | Discharge: 2023-04-21 | Payer: PRIVATE HEALTH INSURANCE

## 2023-04-21 DIAGNOSIS — G8929 Other chronic pain: Secondary | ICD-10-CM

## 2023-04-21 DIAGNOSIS — M542 Cervicalgia: Secondary | ICD-10-CM

## 2023-04-21 MED ORDER — ESCITALOPRAM OXALATE 10 MG PO TABS
10 | ORAL_TABLET | Freq: Every day | ORAL | 1 refills | Status: AC
Start: 2023-04-21 — End: ?

## 2023-04-21 NOTE — Telephone Encounter (Signed)
Called and spoke to patient, reviewed results. Patient voiced understanding, then the call dropped.

## 2023-04-21 NOTE — Telephone Encounter (Signed)
From: [name]  To: Jacklynn Barnacle, DO  Sent: 04/21/2023 2:13 PM EDT  Subject: Call dropped...    Hello, it was nice to see and meet you today. I received the call from Ms. Roe Rutherford, but the call dropped. I was quite sure I understood what the results meant (and [employer] is not my friend). I wasn't sure what she was saying about the lower back ? I'm ok with going anywhere for physical therapy, wherever you give me I'm fine with it. Thanks so much, sorry to bother you.

## 2023-04-21 NOTE — Patient Instructions (Signed)
[facility]*  (Call [employer]/211 if need more resources.)        [facility]:  What they offer: Helps provide affordable, decent, safe housing.  Help further your education in buying a home and securing your financial future with Family Self-Sufficiency (FSS).  They have a home ownership program that helps Housing Choice voucher holders.  They have the Single Room Occupancy (SRO) which helps single homeless males apply to live in the [facility].  Shelter Plus care vouchers for individuals and small households in [place], [place] and [place] counties, who have at least 1 disable person and are homeless. Project based vouchers for subsidized units by the public housing authority's section 8 program.   Phone Number: 419-228-6065  Website: www.[facility].com    [facility]:  What they offer: Help for Domestic violence victims. Obtain emergency confidential shelter, individual and housing advocacy.    Phone Number: 419-228-4357  Website: https://[facility].com    [facility]:  What they offer: Provide prevention services before families reach crisis, Homeless Shelter for families, stabilizing programs once they have secured housing to insure, they remain independent.   Phone Number:  419-879-4600  Website: https://[facility].org/latest/affiliate/[facility]          [facility]:  What they offer: Help for women in crisis. Provide the basic needs - shelter, clothing, food - as well as access to medical care, education, and transportation to necessary appointments and job interviews.  Phone Number's: 419-236-7935 or 567-289-5724  Website: https://[facility].org  [facility]:  What they offer: Overnight shelter for Males 21 years + or at-risk youth on a temporary basis. Day shelter for inclement weather. Meals served daily, clothing distribution, personal care items. Showers & laundry.  Transitional/low-income housing.  Phone Number: 419-224-6961  Website: https://[facility].org  [facility]:  What they offer: Shelter for homeless and/or abused women with or without children. on-site services, along with several area social service agencies, assist everyone with housing, along with personal, educational and vocational goals, meals, showers and laundry.  Phone Number: 419-222-4463  Website: http://[facility]org  [facility]:  What they offer:  Provide supportive housing for adult males and Females.  Phone Number: 567-204-5278  [facility]:  What they offer: Rapid-rehousing, homeless prevention, supportive services, shallow subsidy   Phone Number's: 888-751-1238 or 937-222-0403  Website: http://[facility]

## 2023-04-21 NOTE — Progress Notes (Signed)
I saw and evaluated the patient, performing the key elements of the service.  I discussed the findings, assessment and plan with the resident and agree with the resident's findings and plan as documented in the resident's note. GC modifier added.

## 2023-04-21 NOTE — Progress Notes (Signed)
[facility]  [name]  [address]  [place] [address]  Dept: [phone]  Dept Fax: [phone]  Loc: [phone]      [name] is a 42 y.o. female who presents todayfor her medical conditions/complaints as noted below.  [name] is c/o of ED Follow-up (And would like to talk to you about lower back pain )      :     ED follow-up from 04/16/2023 secondary to acute pharyngitis.  Patient presented to the emergency department at that time for evaluation of sore throat, hoarseness, chest pain, back pain.    Overall workup was reassuring, UA had mild UTI changes, negative strep, negative COVID-19, negative influenza screenings.  All other blood work was benign.  Discharge patient with prescription of nitrofurantoin, and Flonase.    Today:  Back pain, hands numb, has been unable to walk sometimes after work.    Back pain - has been going on for several years, since 2007. States heels of feet will have a stabbing pain.  States that she has noticed recently that she cannot stand as long, as she will have increased pain.  States that she does have shooting pain down both legs.  When asked where her pain is at she point specifically to bilateral sacroiliac joints/piriformis muscles.  Would like to start physical therapy.    Depression: Was previously on medications for mood. Will start and then stop when feeling better, has been years. States currently things have not been good.  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  She states that she has been on Lexapro in the past and tolerated this very well.  Will think about therapy and counseling.  Denies SI/HI at this time.      Patient Active Problem List   Diagnosis    Fatigue    SOB (shortness of breath)    Anemia    Intestinal malabsorption    Deficiency of multiple  nutrient elements    Lower abdominal pain    Chronic abdominal pain    Nausea    Abnormal CT of the abdomen    History of gastric bypass    Obesity    Dilation of small bowel anastomosis    Constipation    Low back pain    Night sweats    Numbness of fingers of both hands    Endometriosis      Goals    None       The patient is allergic to amoxicillin, pcn [penicillins], and zoloft [sertraline hcl].    Medical History  [name] has a past medical history of Anemia, Chronic back pain, Deficiency of multiple nutrient elements, Deficiency of multiple vitamins, Fatigue, GERD (gastroesophageal reflux disease), Hypoglycemia, Intestinal malabsorption, Irregular menstrual cycle, Knee pain, Partial small bowel obstruction (HCC), Post-op pain, SBO (small bowel obstruction) (HCC), Sleep apnea, and SOB (shortness of breath).    Past SurgicalHistory  The patient  has a past surgical history that includes Cesarean section (11/03/2003); Hysterectomy (11/02/2010); salpingectomy (11/02/2006); Cholecystectomy (11/02/2004); Gastric bypass surgery (10/09/2013); Breast lumpectomy (Left, 11/02/1998); other surgical history (09/07/2020); Endoscopy, colon, diagnostic; Colonoscopy; Ectopic pregnancy surgery; polypectomy; and Jejunostomy (12/17/2020).    Family History  This patient's family history includes Breast Cancer in an other family member; Diabetes in her father; Heart  Disease in her mother; High Blood Pressure in her father, mother, and sister; Mental Illness in her father, mother, and sister; Thyroid Disease in her mother and sister.    Social History  [name]  reports that she has never smoked. She has never used smokeless tobacco. She reports current alcohol use. She reports that she does not use drugs.    Medications    Current Outpatient Medications:     escitalopram (LEXAPRO) 10 MG tablet, Take 1 tablet by mouth daily, Disp: 30 tablet, Rfl: 1    fluticasone (FLONASE) 50 MCG/ACT nasal spray, 2 sprays by Each Nostril route daily,  Disp: 16 g, Rfl: 0    nitrofurantoin, macrocrystal-monohydrate, (MACROBID) 100 MG capsule, Take 1 capsule by mouth 2 times daily for 7 days, Disp: 14 capsule, Rfl: 0    benzonatate (TESSALON PERLES) 100 MG capsule, Take 1 capsule by mouth 3 times daily as needed for Cough, Disp: 21 capsule, Rfl: 0    dicyclomine (BENTYL) 10 MG capsule, Take 10 mg by mouth in the morning and at bedtime (Patient not taking: Reported on 04/21/2023), Disp: , Rfl:     gabapentin (NEURONTIN) 100 MG capsule, Take 1 capsule by mouth daily for 180 days. Unsure of dose, Disp: 90 capsule, Rfl: 1    vitamin B-12 (CYANOCOBALAMIN) 1000 MCG tablet, Take 1 tablet by mouth once a week Indications: SUPPLEMENT (Patient not taking: Reported on 04/21/2023), Disp: 30 tablet, Rfl: 1    XIFAXAN 550 MG tablet, Take 550 mg by mouth 3 times daily (Patient not taking: Reported on 04/21/2023), Disp: , Rfl:     LINZESS 145 MCG capsule, TAKE 1 CAPSULE BY MOUTH EVERY DAY ON AN EMPTY STOMACH (Patient not taking: Reported on 04/21/2023), Disp: , Rfl:     metoclopramide (REGLAN) 10 MG tablet, Take 1 tablet by mouth 4 times daily as needed (nausea/vomiting) (Patient not taking: Reported on 04/21/2023), Disp: 20 tablet, Rfl: 0    senna (SENOKOT) 8.6 MG tablet, Take 1 tablet by mouth as needed for Constipation  (Patient not taking: Reported on 04/21/2023), Disp: , Rfl:     Pediatric Multivitamins-Iron (CHILDRENS MULTIVITAMIN/IRON PO), Take 1 tablet by mouth daily  (Patient not taking: Reported on 04/21/2023), Disp: , Rfl:     calcium carbonate (TUMS) 500 MG chewable tablet, Take 1 tablet by mouth in the morning, at noon, and at bedtime  (Patient not taking: Reported on 04/21/2023), Disp: , Rfl:     docusate sodium (STOOL SOFTENER) 100 MG capsule, Take 100 mg by mouth as needed  (Patient not taking: Reported on 04/21/2023), Disp: , Rfl:     ondansetron (ZOFRAN-ODT) 4 MG disintegrating tablet, Take 1 tablet by mouth 3 times daily as needed for Nausea or Vomiting (Patient not  taking: Reported on 04/21/2023), Disp: 21 tablet, Rfl: 0    famotidine (PEPCID) 40 MG tablet, Take 1 tablet by mouth daily GERD (Patient not taking: Reported on 04/21/2023), Disp: 30 tablet, Rfl: 0    Ferrous Sulfate (IRON PO), Take 15 mLs by mouth daily  (Patient not taking: Reported on 04/21/2023), Disp: , Rfl:     VITAMIN D PO, Take 50,000 Units by mouth once a week  (Patient not taking: Reported on 04/21/2023), Disp: , Rfl:     vitamin B-1 (THIAMINE) 100 MG tablet, Take 100 mg by mouth daily  (Patient not taking: Reported on 04/21/2023), Disp: , Rfl:     Subjective:      Review of Systems   Constitutional:  Negative for  chills, fatigue and fever.   HENT:  Positive for congestion and postnasal drip.    Respiratory:  Positive for cough. Negative for shortness of breath.    Cardiovascular:  Negative for chest pain.   Gastrointestinal:  Negative for abdominal pain.   Neurological:  Negative for headaches.       Objective:     Vitals:    04/21/23 0805   BP: 112/68   Site: Right Upper Arm   Position: Sitting   Pulse: 68   Resp: 20   Temp: 98.2 F (36.8 C)   TempSrc: Oral   SpO2: 100%   Weight: 91.8 kg (202 lb 6.4 oz)   Height: 1.626 m (5\' 4" )       Physical Exam  Vitals reviewed.   HENT:      Head: Normocephalic.      Right Ear: External ear normal.      Left Ear: External ear normal.      Nose: Nose normal.      Mouth/Throat:      Mouth: Mucous membranes are moist.   Eyes:      Conjunctiva/sclera: Conjunctivae normal.   Cardiovascular:      Rate and Rhythm: Normal rate and regular rhythm.      Pulses: Normal pulses.      Heart sounds: Normal heart sounds. No murmur heard.     No gallop.   Pulmonary:      Effort: Pulmonary effort is normal.      Breath sounds: Normal breath sounds. No wheezing, rhonchi or rales.   Abdominal:      General: There is no distension.      Palpations: Abdomen is soft. There is no mass.      Hernia: No hernia is present.   Musculoskeletal:      Right lower leg: No edema.      Left lower leg: No  edema.   Skin:     General: Skin is warm.   Neurological:      General: No focal deficit present.      Mental Status: She is alert.      Motor: No weakness.      Gait: Gait normal.      Comments: Negative Spurling's testing, tenderness to palpation of bilateral sacroiliac joint/piriformis musculature, tenderness to palpation at C5/C6 with hypertonicity noted to bilateral trapezius muscles   Psychiatric:         Mood and Affect: Mood normal.         Behavior: Behavior normal.         Thought Content: Thought content normal.         Judgment: Judgment normal.         Lab Results   Component Value Date    WBC 5.1 04/16/2023    HGB 10.0 (L) 04/16/2023    HCT 32.7 (L) 04/16/2023    PLT 402 (H) 04/16/2023    CHOL 152 09/07/2015    TRIG 61 09/07/2015    HDL 65 (H) 09/07/2015    ALT 10 (L) 01/28/2021    AST 13 01/28/2021    NA 139 04/16/2023    K 4.3 04/16/2023    CL 105 04/16/2023    CREATININE 1.0 04/16/2023    BUN 10 04/16/2023    CO2 22 (L) 04/16/2023    TSH 2.950 01/28/2021       [facility] [facility]:   1. Chronic bilateral low back pain with bilateral sciatica  -Acute on chronic exacerbation of low  back pain, chronic since 2007 however pain is worsened recently  - Continue forward with x-ray of lumbar spine since pain has acutely worsened, physical therapy as below  - Okay to continue over-the-counter Voltaren gel, ibuprofen, Tylenol.  Can also use heat/ice.  - XR LUMBAR SPINE (2-3 VIEWS); Future  - [facility]    2. Neck pain  -Acute on chronic exacerbation, has had chronic neck pain however worsened recently, has some numbness and tingling sensation down into her fingertips  - Continue forward with x-ray imaging as below and physical therapy  - [facility]  - XR CERVICAL SPINE (4-5 VIEWS); Future    3. Depression, unspecified depression type  -Chronic, uncontrolled currently, patient has been on Lexapro in the past and this helped her well  - Start Lexapro 10 mg at this time, therapy  and counseling discussed with patient, she states she will think about this and talk about it at next visit in 4 to 6 weeks  - escitalopram (LEXAPRO) 10 MG tablet; Take 1 tablet by mouth daily  Dispense: 30 tablet; Refill: 1    4. Viral URI  -Acute, improving, continue Flonase and Tessalon Perles as prescribed to the emergency department    5. Acute cystitis without hematuria  -Acute, improving, continue out Macrobid antibiotic until completion    Plan will be to see patient back in 4 to 6 weeks to further discuss mood and to further establish care with other past medical history at that time.        Return for 4-6 weeks with me for mood, back pain.    Orders Placed   Orders Placed This Encounter   Procedures    XR LUMBAR SPINE (2-3 VIEWS)     Standing Status:   Future     Standing Expiration Date:   04/20/2024     Order Specific Question:   Reason for exam:     Answer:   Chronic lower lumbar spine pain since 2007, worsening recently    XR CERVICAL SPINE (4-5 VIEWS)     Standing Status:   Future     Standing Expiration Date:   04/20/2024     Order Specific Question:   Reason for exam:     Answer:   Cervical spine pain, pain into fingertips    [facility]     Referral Priority:   Routine     Referral Type:   Eval and Treat     Referral Reason:   Specialty Services Required     Requested Specialty:   Physical Therapist     Number of Visits Requested:   1       Prescriptions given/sent  Orders Placed This Encounter   Medications    escitalopram (LEXAPRO) 10 MG tablet     Sig: Take 1 tablet by mouth daily     Dispense:  30 tablet     Refill:  1       Patient given educational materials - see patient instructions.  Discussed use, benefit, and side effects of recommended medications.  All patient questions answered.  Pt voiced understanding.  Reviewed health maintenance.              Electronically signed by Jacklynn Barnacle, DO on 04/21/2023 at 8:52 AM

## 2023-04-21 NOTE — Telephone Encounter (Signed)
Can we let patient know the following:    X-ray of lumbar spine demonstrating well-maintained disc and joint spaces, some chronic degenerative changes to the to lower lumbar levels however no concerns for compression    X-ray of cervical spine demonstrating possible muscle spasms, there is some mild to space narrowing to C5 and C6 vertebral levels.    Based on all the above continuing forward with physical therapy is appropriate.  Based on the x-ray imaging of the cervical spine with disc space narrowing, could consider nerve study/EMG to see if this is why she is experiencing numbness and tingling sensation in her fingers.  If this is something she is interested in before next appointment I can always place an order, if she would rather work with physical therapy and be seen in 1 month's time and discussed them, that would be fine as well.  Thank you

## 2023-05-10 NOTE — Telephone Encounter (Signed)
From: [name]  To: Jacklynn Barnacle, DO  Sent: 05/08/2023 10:11 PM EDT  Subject: I definitely need your help    Hi Dr. Gala Lewandowsky,  I'd like to talk with you about a serious matter. 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 I me someone who was HIV positive. He says that he undetectable and can't pass it and even went so far as to show me his paperwork which now I realize I never seen his name on the documents. We had unprotected sex... Basically because he talked a good game and said we'd married so that we are living right under God's eyes... But then he blocked me on everything and said he didn't want to be together now I'm stuck wondering if he's infected me. We slept together on June 3.... I did it because if figured I'm already suicidal maybe he's the one that might be my forever...so I understood what I was getting myself into and i know that there is a such thing as serodiscordant couples I did my research. Just not well enough. Please help me... I'm honestly going out of my mind.

## 2023-06-04 ENCOUNTER — Ambulatory Visit: Admit: 2023-06-04 | Discharge: 2023-06-04 | Payer: PRIVATE HEALTH INSURANCE

## 2023-06-04 ENCOUNTER — Other Ambulatory Visit: Admit: 2023-06-04 | Payer: PRIVATE HEALTH INSURANCE

## 2023-06-04 DIAGNOSIS — Z114 Encounter for screening for human immunodeficiency virus [HIV]: Secondary | ICD-10-CM

## 2023-06-04 LAB — HEMOGLOBIN A1C
Estimated Avg Glucose: 93 mg/dL (ref 70–126)
Hemoglobin A1C: 5.1 % (ref 4.4–6.4)

## 2023-06-04 LAB — LIPID, FASTING
Cholesterol, Fasting: 171 mg/dl (ref 100–199)
HDL: 75 mg/dL
LDL Cholesterol: 83 mg/dL
Triglyceride, Fasting: 63 mg/dl (ref 0–199)

## 2023-06-04 LAB — HEPATITIS C ANTIBODY: Hepatitis C Ab: NEGATIVE

## 2023-06-04 LAB — TSH WITH REFLEX: TSH: 2.12 u[IU]/mL (ref 0.400–4.200)

## 2023-06-04 MED ORDER — EMTRICITABINE-TENOFOVIR DF 200-300 MG PO TABS
200-300 MG | ORAL_TABLET | Freq: Every day | ORAL | 2 refills | Status: AC
Start: 2023-06-04 — End: ?

## 2023-06-04 MED ORDER — QUETIAPINE FUMARATE 25 MG PO TABS
25 | ORAL_TABLET | Freq: Every day | ORAL | 1 refills | Status: AC
Start: 2023-06-04 — End: ?

## 2023-06-04 NOTE — Progress Notes (Cosign Needed)
[facility]  [facility]  [address]  [place] [address]  Dept: [phone]  Dept Fax: [phone]  Loc: [phone]      [name] is a 42 y.o. female who presents todayfor her medical conditions/complaints as noted below.  [name] is c/o of Follow-up (Discuss possible HIV infection. )      :     Last seen by me on 04/21/2023    Patient has acute concerns regarding possible HIV infection.  Patient states that she has been in a relationship with her partner who has tested positive for HIV however has paperwork stating that this is undetectable.  She states that she is unsure how often patient is getting checked or even when his last check was.  She states she is concerned that she might have HIV.  She goes on to state that she would like further testing for STIs as well, denies any discharge or any other red flag symptoms.  Further discussion was had regarding PrEP and if patient would be interested.  She states that she would be interested at this time.  That has been sent in today.    Chronic bilateral low back pain: X-ray lumbar spine well-maintained disc joint some chronic degenerative changes no concerns.  X-ray cervical spine mild space narrowing between C5 and C6.  Patient states that her job hours a change from 7 PM to 7 AM so she has been unable to get physical therapy however she is still very interested and will schedule this soon.  Symptoms are controlled at this point.    Mood disorder: Patient states that the Lexapro has been okay but not really working that well.  Was previously on Zoloft and did not tolerate that either.  She goes on to state that she has been sleeping very poorly around 3 hours at night.  She states that this has been chronic in nature.  She states that she will also have impulsive buying, gambling, feels like she has to get up and work in the middle the night, ruminating thoughts.  Patient was  following with psychology in the past and told that she has bipolar however never formally diagnosed by a psychiatrist.  Is agreeable to starting a mood stabilizer at this point in time and discontinuing Lexapro since there has been no benefit.      Patient Active Problem List   Diagnosis    Fatigue    SOB (shortness of breath)    Anemia    Intestinal malabsorption    Deficiency of multiple nutrient elements    Lower abdominal pain    Chronic abdominal pain    Nausea    Abnormal CT of the abdomen    History of gastric bypass    Obesity    Dilation of small bowel anastomosis    Constipation    Low back pain    Night sweats    Numbness of fingers of both hands    Endometriosis      Goals    None       The patient is allergic to amoxicillin, pcn [penicillins], and zoloft [sertraline hcl].    Medical History  [name] has a past medical history of Anemia, Chronic back pain, Deficiency of multiple nutrient elements, Deficiency of multiple vitamins, Fatigue, GERD (gastroesophageal reflux disease), Hypoglycemia, Intestinal malabsorption, Irregular menstrual cycle, Knee pain, Partial small bowel obstruction (HCC), Post-op pain, SBO (  small bowel obstruction) (HCC), Sleep apnea, and SOB (shortness of breath).    Past SurgicalHistory  The patient  has a past surgical history that includes Cesarean section (11/03/2003); Hysterectomy (11/02/2010); salpingectomy (11/02/2006); Cholecystectomy (11/02/2004); Gastric bypass surgery (10/09/2013); Breast lumpectomy (Left, 11/02/1998); other surgical history (09/07/2020); Endoscopy, colon, diagnostic; Colonoscopy; Ectopic pregnancy surgery; polypectomy; and Jejunostomy (12/17/2020).    Family History  This patient's family history includes Breast Cancer in an other family member; Diabetes in her father; Heart Disease in her mother; High Blood Pressure in her father, mother, and sister; Mental Illness in her father, mother, and sister; Thyroid Disease in her mother and sister.    Social  History  [name]  reports that she has never smoked. She has never used smokeless tobacco. She reports current alcohol use. She reports that she does not use drugs.    Medications    Current Outpatient Medications:     QUEtiapine (SEROQUEL) 25 MG tablet, Take 1 tablet by mouth daily, Disp: 60 tablet, Rfl: 1    emtricitabine-tenofovir (TRUVADA) 200-300 MG per tablet, Take 1 tablet by mouth daily, Disp: 30 tablet, Rfl: 2    escitalopram (LEXAPRO) 10 MG tablet, Take 1 tablet by mouth daily, Disp: 30 tablet, Rfl: 1    fluticasone (FLONASE) 50 MCG/ACT nasal spray, 2 sprays by Each Nostril route daily, Disp: 16 g, Rfl: 0    dicyclomine (BENTYL) 10 MG capsule, Take 10 mg by mouth in the morning and at bedtime (Patient not taking: Reported on 04/21/2023), Disp: , Rfl:     gabapentin (NEURONTIN) 100 MG capsule, Take 1 capsule by mouth daily for 180 days. Unsure of dose, Disp: 90 capsule, Rfl: 1    vitamin B-12 (CYANOCOBALAMIN) 1000 MCG tablet, Take 1 tablet by mouth once a week Indications: SUPPLEMENT (Patient not taking: Reported on 04/21/2023), Disp: 30 tablet, Rfl: 1    XIFAXAN 550 MG tablet, Take 550 mg by mouth 3 times daily (Patient not taking: Reported on 04/21/2023), Disp: , Rfl:     LINZESS 145 MCG capsule, TAKE 1 CAPSULE BY MOUTH EVERY DAY ON AN EMPTY STOMACH (Patient not taking: Reported on 04/21/2023), Disp: , Rfl:     metoclopramide (REGLAN) 10 MG tablet, Take 1 tablet by mouth 4 times daily as needed (nausea/vomiting) (Patient not taking: Reported on 04/21/2023), Disp: 20 tablet, Rfl: 0    senna (SENOKOT) 8.6 MG tablet, Take 1 tablet by mouth as needed for Constipation  (Patient not taking: Reported on 04/21/2023), Disp: , Rfl:     Pediatric Multivitamins-Iron (CHILDRENS MULTIVITAMIN/IRON PO), Take 1 tablet by mouth daily  (Patient not taking: Reported on 04/21/2023), Disp: , Rfl:     calcium carbonate (TUMS) 500 MG chewable tablet, Take 1 tablet by mouth in the morning, at noon, and at bedtime  (Patient not taking:  Reported on 04/21/2023), Disp: , Rfl:     docusate sodium (STOOL SOFTENER) 100 MG capsule, Take 100 mg by mouth as needed  (Patient not taking: Reported on 04/21/2023), Disp: , Rfl:     ondansetron (ZOFRAN-ODT) 4 MG disintegrating tablet, Take 1 tablet by mouth 3 times daily as needed for Nausea or Vomiting (Patient not taking: Reported on 04/21/2023), Disp: 21 tablet, Rfl: 0    famotidine (PEPCID) 40 MG tablet, Take 1 tablet by mouth daily GERD (Patient not taking: Reported on 04/21/2023), Disp: 30 tablet, Rfl: 0    Ferrous Sulfate (IRON PO), Take 15 mLs by mouth daily  (Patient not taking: Reported on 04/21/2023), Disp: , Rfl:  VITAMIN D PO, Take 50,000 Units by mouth once a week  (Patient not taking: Reported on 04/21/2023), Disp: , Rfl:     vitamin B-1 (THIAMINE) 100 MG tablet, Take 100 mg by mouth daily  (Patient not taking: Reported on 04/21/2023), Disp: , Rfl:     Subjective:      Review of Systems   Constitutional:  Negative for chills and fever.   Respiratory:  Negative for shortness of breath.    Cardiovascular:  Negative for chest pain.   Gastrointestinal:  Negative for abdominal pain.   Genitourinary:  Negative for dysuria, pelvic pain and vaginal discharge.   Neurological:  Negative for headaches.       Objective:     Vitals:    06/04/23 0905   BP: 118/78   Site: Left Upper Arm   Position: Sitting   Cuff Size: Medium Adult   Pulse: 70   Resp: 21   Temp: 98.2 F (36.8 C)   TempSrc: Oral   SpO2: 98%   Weight: 89.3 kg (196 lb 12.8 oz)   Height: 1.626 m (5\' 4" )       Physical Exam  Vitals reviewed.   Cardiovascular:      Rate and Rhythm: Normal rate and regular rhythm.      Pulses: Normal pulses.      Heart sounds: Normal heart sounds. No murmur heard.     No gallop.   Pulmonary:      Effort: Pulmonary effort is normal.      Breath sounds: Normal breath sounds. No wheezing, rhonchi or rales.   Abdominal:      General: There is no distension.      Palpations: Abdomen is soft.   Musculoskeletal:      Right lower  leg: No edema.      Left lower leg: No edema.   Skin:     General: Skin is warm.   Neurological:      General: No focal deficit present.      Mental Status: She is alert.   Psychiatric:         Mood and Affect: Mood normal.         Behavior: Behavior normal.         Thought Content: Thought content normal.         Judgment: Judgment normal.         Lab Results   Component Value Date    WBC 5.1 04/16/2023    HGB 10.0 (L) 04/16/2023    HCT 32.7 (L) 04/16/2023    PLT 402 (H) 04/16/2023    CHOL 152 09/07/2015    TRIG 61 09/07/2015    HDL 65 (H) 09/07/2015    ALT 10 (L) 01/28/2021    AST 13 01/28/2021    NA 139 04/16/2023    K 4.3 04/16/2023    CL 105 04/16/2023    CREATININE 1.0 04/16/2023    BUN 10 04/16/2023    CO2 22 (L) 04/16/2023    TSH 2.950 01/28/2021       [place]:   1. Encounter for screening for HIV  -Patient's current partner has tested HIV positive states has undetectable CD4 count however unclear how often patient is getting tested  - Will continue forward with HIV screen as below and also have sent in prep for patient to start and take daily, she understands.  - HIV Screen; Future    2. Need for hepatitis C screening test  - Hepatitis C  Antibody; Future    3. Chronic bilateral low back pain with bilateral sciatica  4. Neck pain  -Chronic degenerative changes noted on x-ray imaging of cervical and lumbar spine, patient is to continue forward physical therapy, could consider EMG in the future if needed    5. Lipid screening  - Lipid, Fasting; Future    6. Diabetes mellitus screening  - Hemoglobin A1C; Future    7. Mood disorder (HCC)  -Chronic and uncontrolled, Lexapro added no benefit, patient lacking sleep only 3 to 4 hours a night, impulsive buying and ruminating thoughts, continue forward with trial of Seroquel as below, check thyroid labs as it has been sometime since thyroid has been checked for patient  - TSH With Reflex Ft4; Future  - QUEtiapine (SEROQUEL) 25 MG tablet; Take 1 tablet by mouth daily   Dispense: 60 tablet; Refill: 1    8. Possible exposure to STI  -Patient is concerned about exposure of STI, is agreeable to urine testing, declines any discharge, testing as below  - Neisseria Gonorrhoeae, NAA  - Chlamydia Trachomatis, NAA  - Trichomonas Vaginalis by NAA; Future    9. HIV exposure  -Please see HIV screening as above.  Prep ordered for patient  - HIV Screen; Future  - emtricitabine-tenofovir (TRUVADA) 200-300 MG per tablet; Take 1 tablet by mouth daily  Dispense: 30 tablet; Refill: 2    Return for 1 month with me labs, mood.    Orders Placed   Orders Placed This Encounter   Procedures    Neisseria Gonorrhoeae, NAA    Chlamydia Trachomatis, NAA    Trichomonas Vaginalis by NAA     Standing Status:   Future     Number of Occurrences:   1     Standing Expiration Date:   06/03/2024    Hepatitis C Antibody     Standing Status:   Future     Number of Occurrences:   1     Standing Expiration Date:   06/03/2024    TSH With Reflex Ft4     Standing Status:   Future     Number of Occurrences:   1     Standing Expiration Date:   06/03/2024    Lipid, Fasting     Standing Status:   Future     Number of Occurrences:   1     Standing Expiration Date:   06/03/2024    Hemoglobin A1C     Standing Status:   Future     Number of Occurrences:   1     Standing Expiration Date:   06/03/2024    HIV Screen     Standing Status:   Future     Number of Occurrences:   1     Standing Expiration Date:   06/03/2024       Prescriptions given/sent  Orders Placed This Encounter   Medications    QUEtiapine (SEROQUEL) 25 MG tablet     Sig: Take 1 tablet by mouth daily     Dispense:  60 tablet     Refill:  1    emtricitabine-tenofovir (TRUVADA) 200-300 MG per tablet     Sig: Take 1 tablet by mouth daily     Dispense:  30 tablet     Refill:  2       Patient given educational materials - see patient instructions.  Discussed use, benefit, and side effects of recommended medications.  All patient questions answered.  Pt voiced understanding.  Reviewed  health maintenance.              Electronically signed by Jacklynn Barnacle, DO on 06/04/2023 at 11:20 AM

## 2023-06-04 NOTE — Progress Notes (Signed)
Health Maintenance Due   Topic Date Due    Hepatitis B vaccine (1 of 3 - 3-dose series) Never done    COVID-19 Vaccine (1) Never done    Varicella vaccine (1 of 2 - 2-dose childhood series) Never done    HIV screen  Never done    Hepatitis C screen  Never done    DTaP/Tdap/Td vaccine (1 - Tdap) Never done    Lipids  05/25/2021    Breast cancer screen  Never done    Flu vaccine (1) Never done

## 2023-06-04 NOTE — Progress Notes (Signed)
I saw and evaluated the patient, performing the key elements of the service.  I discussed the findings, assessment and plan with the resident and agree with the resident's findings and plan as documented in the resident's note. GC modifier added.

## 2023-06-05 LAB — HIV SCREEN

## 2023-06-08 LAB — C.TRACHOMATIS N.GONORRHOEAE DNA
C. Trachomatis Amplified: NEGATIVE
NEISSERIA GONORRHOEAE BY AMP: NEGATIVE

## 2023-06-10 ENCOUNTER — Telehealth

## 2023-06-10 LAB — TRICHOMONAS VAGINALIS BY NAA: Trichomonas Vaginalis by NAA: POSITIVE — AB

## 2023-06-10 MED ORDER — METRONIDAZOLE 500 MG PO TABS
500 | ORAL_TABLET | Freq: Two times a day (BID) | ORAL | 0 refills | Status: AC
Start: 2023-06-10 — End: 2023-06-17

## 2023-06-10 NOTE — Telephone Encounter (Signed)
Can we let [name] know the following, blood testing was reviewed, HIV negative, chlamydia gonorrhea negative, hepatitis C negative, thyroid labs normal, cholesterol looks good, hemoglobin A1c for diabetes check looks normal.    Patient did however have a positive trichomonas test, I will be sending in antibiotic for her called metronidazole that she will take twice a day for the next 7 days.  If she has any questions please let me know.    Thanks.

## 2023-06-10 NOTE — Telephone Encounter (Signed)
Attempted to contact patient with results - phone is silent then rings busy.

## 2023-06-11 NOTE — Telephone Encounter (Signed)
Attempted to contact patient, phone rings busy.

## 2023-06-14 NOTE — Telephone Encounter (Signed)
Third attempt to contact pt, left VM, [facility] message sent.

## 2023-07-13 ENCOUNTER — Ambulatory Visit: Admit: 2023-07-13 | Discharge: 2023-07-13 | Payer: PRIVATE HEALTH INSURANCE

## 2023-07-13 VITALS — BP 116/84 | HR 78 | Temp 97.90000°F | Resp 18 | Ht 64.0 in | Wt 191.6 lb

## 2023-07-13 DIAGNOSIS — F39 Unspecified mood [affective] disorder: Secondary | ICD-10-CM

## 2023-07-13 MED ORDER — DULOXETINE HCL 30 MG PO CPEP
30 | ORAL_CAPSULE | Freq: Every day | ORAL | 1 refills | Status: DC
Start: 2023-07-13 — End: 2023-10-11

## 2023-07-13 NOTE — Progress Notes (Signed)
 [facility]  [facility]  [address]  [place] [address]  Dept: [phone]  Dept Fax: [phone]  Loc: [phone]      [name] is a 42 y.o. female who presents todayfor her medical conditions/complaints as noted below.  [name] is c/o of Follow-up (Pt states both knees have been causing her pain walking up steps/walking )      :     Patient last seen by me on 06/04/2023.    At that visit there was some concerns that patient may have HIV, had discussed Truvada  at that visit but patient declined starting that, her HIV testing was negative.  She stated that she would no longer be dealing with her significant other that was HIV positive.  No concerns at this visit.    Chronic bilateral low back pain/neck pain: Wanted to try physical therapy prior to doing EMG.  Patient states she has not made physical therapy but has plans to do very soon.    Mood disorder: Chronic and uncontrolled symptoms at last visit.  Lexapro  was added no benefit for patient.  She states that she has been lacking sleep only 3 to 4 hours a night with impulsive buying and ruminating thoughts.  Started Seroquel  at 25 mg last visit, per patient message on 8/3 she was sleeping much better.     Interval update: Patient continues to sleep very well with Seroquel  but is still having outburst of crying episodes and some depression.  She would like to try different medication.  Discussed Cymbalta  which might also help with some of her pain related issues, she is agreeable.  Denies SI/HI    Reviewed lab work for STI testing and other basic blood work.  Everything looked good except for a positive trichomonas test, patient states that metronidazole  was too expensive for her to pick up so she has not taken but has no current symptoms.    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 Drinking more water more protein.  Has lost some  weight      Patient Active Problem List   Diagnosis    Fatigue    SOB (shortness of breath)    Anemia    Intestinal malabsorption    Deficiency of multiple nutrient elements    Lower abdominal pain    Chronic abdominal pain    Nausea    Abnormal CT of the abdomen    History of gastric bypass    Obesity    Dilation of small bowel anastomosis    Constipation    Low back pain    Night sweats    Numbness of fingers of both hands    Endometriosis      Goals    None       The patient is allergic to amoxicillin, pcn [penicillins], and zoloft  [sertraline  hcl].    Medical History  Talar has a past medical history of Anemia, Chronic back pain, Deficiency of multiple nutrient elements, Deficiency of multiple vitamins, Fatigue, GERD (gastroesophageal reflux disease), Hypoglycemia, Intestinal malabsorption, Irregular menstrual cycle, Knee pain, Partial small bowel obstruction (HCC), Post-op pain, SBO (small bowel obstruction) (HCC), Sleep apnea, and SOB (shortness of breath).    Past SurgicalHistory  The patient  has a past surgical history that includes Cesarean section (11/03/2003); Hysterectomy (11/02/2010); salpingectomy (11/02/2006); Cholecystectomy (11/02/2004); Gastric bypass surgery (10/09/2013); Breast lumpectomy (Left, 11/02/1998); other  surgical history (09/07/2020); Endoscopy, colon, diagnostic; Colonoscopy; Ectopic pregnancy surgery; polypectomy; and Jejunostomy (12/17/2020).    Family History  This patient's family history includes Breast Cancer in an other family member; Diabetes in her father; Heart Disease in her mother; High Blood Pressure in her father, mother, and sister; Mental Illness in her father, mother, and sister; Thyroid Disease in her mother and sister.    Social History  Melena  reports that she has never smoked. She has never used smokeless tobacco. She reports current alcohol use. She reports that she does not use drugs.    Medications    Current Outpatient Medications:     DULoxetine  (CYMBALTA )  30 MG extended release capsule, Take 1 capsule by mouth daily, Disp: 30 capsule, Rfl: 1    QUEtiapine  (SEROQUEL ) 25 MG tablet, Take 1 tablet by mouth daily, Disp: 60 tablet, Rfl: 1    fluticasone  (FLONASE ) 50 MCG/ACT nasal spray, 2 sprays by Each Nostril route daily, Disp: 16 g, Rfl: 0    dicyclomine (BENTYL) 10 MG capsule, Take 1 capsule by mouth in the morning and at bedtime, Disp: , Rfl:     gabapentin  (NEURONTIN ) 100 MG capsule, Take 1 capsule by mouth daily for 180 days. Unsure of dose, Disp: 90 capsule, Rfl: 1    vitamin B-12 (CYANOCOBALAMIN) 1000 MCG tablet, Take 1 tablet by mouth once a week Indications: SUPPLEMENT, Disp: 30 tablet, Rfl: 1    XIFAXAN 550 MG tablet, Take 1 tablet by mouth 3 times daily, Disp: , Rfl:     LINZESS  145 MCG capsule, , Disp: , Rfl:     metoclopramide  (REGLAN ) 10 MG tablet, Take 1 tablet by mouth 4 times daily as needed (nausea/vomiting), Disp: 20 tablet, Rfl: 0    senna (SENOKOT) 8.6 MG tablet, Take 1 tablet by mouth as needed for Constipation, Disp: , Rfl:     Pediatric Multivitamins-Iron (CHILDRENS MULTIVITAMIN/IRON PO), Take 1 tablet by mouth daily, Disp: , Rfl:     calcium  carbonate (TUMS) 500 MG chewable tablet, Take 1 tablet by mouth in the morning, at noon, and at bedtime, Disp: , Rfl:     docusate sodium  (STOOL SOFTENER) 100 MG capsule, Take 1 capsule by mouth as needed, Disp: , Rfl:     ondansetron  (ZOFRAN -ODT) 4 MG disintegrating tablet, Take 1 tablet by mouth 3 times daily as needed for Nausea or Vomiting, Disp: 21 tablet, Rfl: 0    famotidine  (PEPCID ) 40 MG tablet, Take 1 tablet by mouth daily GERD, Disp: 30 tablet, Rfl: 0    Ferrous Sulfate (IRON PO), Take 15 mLs by mouth daily, Disp: , Rfl:     VITAMIN D PO, Take 50,000 Units by mouth once a week, Disp: , Rfl:     vitamin B-1 (THIAMINE) 100 MG tablet, Take 1 tablet by mouth daily, Disp: , Rfl:     emtricitabine -tenofovir  (TRUVADA ) 200-300 MG per tablet, Take 1 tablet by mouth daily (Patient not taking: Reported on  07/13/2023), Disp: 30 tablet, Rfl: 2    Subjective:      Review of Systems   Constitutional:  Negative for chills and fever.   Respiratory:  Negative for shortness of breath.    Cardiovascular:  Negative for chest pain.   Gastrointestinal:  Negative for abdominal pain.   Neurological:  Negative for headaches.       Objective:     Vitals:    07/13/23 1621   BP: 116/84   Site: Left Upper Arm   Pulse: 78  Resp: 18   Temp: 97.9 F (36.6 C)   TempSrc: Oral   SpO2: 98%   Weight: 86.9 kg (191 lb 9.6 oz)   Height: 1.626 m (5' 4)       Physical Exam  Vitals reviewed.   Cardiovascular:      Rate and Rhythm: Normal rate and regular rhythm.      Pulses: Normal pulses.      Heart sounds: Normal heart sounds. No murmur heard.     No gallop.   Pulmonary:      Effort: Pulmonary effort is normal.      Breath sounds: Normal breath sounds. No wheezing, rhonchi or rales.   Abdominal:      General: There is no distension.      Palpations: Abdomen is soft.   Musculoskeletal:      Right lower leg: No edema.      Left lower leg: No edema.   Skin:     General: Skin is warm.   Neurological:      General: No focal deficit present.      Mental Status: She is alert.   Psychiatric:         Mood and Affect: Mood normal.         Behavior: Behavior normal.         Thought Content: Thought content normal.         Judgment: Judgment normal.         Lab Results   Component Value Date    WBC 5.1 04/16/2023    HGB 10.0 (L) 04/16/2023    HCT 32.7 (L) 04/16/2023    PLT 402 (H) 04/16/2023    CHOL 152 09/07/2015    TRIG 61 09/07/2015    HDL 75 06/04/2023    ALT 10 (L) 01/28/2021    AST 13 01/28/2021    NA 139 04/16/2023    K 4.3 04/16/2023    CL 105 04/16/2023    CREATININE 1.0 04/16/2023    BUN 10 04/16/2023    CO2 22 (L) 04/16/2023    TSH 2.120 06/04/2023    LABA1C 5.1 06/04/2023       /Plan:   1. Mood disorder (HCC)  -Chronic, uncontrolled, believed initially to be secondary to lack of sleep, has been taking Seroquel  which has helped greatly with her  sleep but still has some uncontrolled mood problems  - Has tried Lexapro  she states that this made her feel no emotions at all, has had a allergic reaction to Zoloft   - Proceed forward with Cymbalta , may gain some benefit secondary to her chronic pain as well, discussed with patient that it could take 4 to 6 weeks for this to take full effect she understands.  - DULoxetine  (CYMBALTA ) 30 MG extended release capsule; Take 1 capsule by mouth daily  Dispense: 30 capsule; Refill: 1    2. Neck pain  3. Chronic bilateral low back pain with bilateral sciatica  -Chronic, symptomatic but controlled, continue with physical therapy as ordered prior, consider EMG if needed in the future based on symptoms    Plan will be to follow-up in 6 weeks to discuss mood after starting Cymbalta     Orders Placed   No orders of the defined types were placed in this encounter.      Prescriptions given/sent  Orders Placed This Encounter   Medications    DULoxetine  (CYMBALTA ) 30 MG extended release capsule     Sig: Take 1 capsule by mouth daily  Dispense:  30 capsule     Refill:  1       Patient given educational materials - see patient instructions.  Discussed use, benefit, and side effects of recommended medications.  All patient questions answered.  Pt voiced understanding.  Reviewed health maintenance.              Electronically signed by Massie Grove, DO on 07/13/2023 at 5:52 PM

## 2023-07-13 NOTE — Progress Notes (Signed)
 S: 42 y.o. female with   Chief Complaint   Patient presents with    Follow-up     Pt states both knees have been causing her pain walking up steps/walking        HPI: please see resident note for HPI and ROS.    BP Readings from Last 3 Encounters:   07/13/23 116/84   06/04/23 118/78   04/21/23 112/68     Wt Readings from Last 3 Encounters:   07/13/23 86.9 kg (191 lb 9.6 oz)   06/04/23 89.3 kg (196 lb 12.8 oz)   04/21/23 91.8 kg (202 lb 6.4 oz)       O: VS:  height is 1.626 m (5' 4) and weight is 86.9 kg (191 lb 9.6 oz). Her oral temperature is 97.9 F (36.6 C). Her blood pressure is 116/84 and her pulse is 78. Her respiration is 18 and oxygen saturation is 98%.   AAO/NAD, appropriate affect for mood     Diagnosis Orders   1. Mood disorder (HCC)  DULoxetine  (CYMBALTA ) 30 MG extended release capsule      2. Neck pain        3. Chronic bilateral low back pain with bilateral sciatica            Plan:  Please refer to resident note for full plan.    42 year old female here for follow up. Recent labs positive for trichomonas; prescribed Flagyl  but has not yet picked it up. Complains of chronic bilateral low back pain and neck pain; referred to PT previously and is scheduled for appointment. Moods are improved but not fully controlled on Seroquel . Sleep is improved. No SI/HI. OK to start Cymbalta  as ordered above. Declines mammogram ordered today. Follow up in 6 weeks or sooner if needed.     Health Maintenance Due   Topic Date Due    Varicella vaccine (1 of 2 - 13+ 2-dose series) Never done    Hepatitis B vaccine (1 of 3 - 19+ 3-dose series) Never done    DTaP/Tdap/Td vaccine (1 - Tdap) Never done    Breast cancer screen  Never done    Flu vaccine (1) Never done    COVID-19 Vaccine (1 - 2023-24 season) Never done       Attending Physician Statement  I have discussed the case, including pertinent history and exam findings with the resident. I also have seen the patient and performed key portions of the examination.  I  agree with the documented assessment and plan as documented by the resident.        Marah Park E Mikaella Escalona, DO 07/14/2023 10:47 AM

## 2023-09-12 ENCOUNTER — Encounter: Admit: 2018-03-21 | Payer: MEDICARE

## 2023-09-12 ENCOUNTER — Encounter: Admit: 2018-09-21 | Discharge: 2018-09-21 | Payer: MEDICARE | Attending: Family Medicine

## 2023-09-13 ENCOUNTER — Encounter: Admit: 2023-09-13 | Discharge: 2018-04-01 | Payer: MEDICARE | Attending: Internal Medicine

## 2023-09-14 ENCOUNTER — Encounter: Admit: 2023-09-14 | Payer: MEDICARE

## 2023-09-15 ENCOUNTER — Encounter: Admit: 2023-09-15 | Discharge: 2018-03-23 | Payer: MEDICARE

## 2023-09-16 ENCOUNTER — Encounter: Admit: 2023-09-16 | Discharge: 2018-04-28 | Payer: MEDICARE | Attending: Dermatology

## 2023-09-17 ENCOUNTER — Encounter: Payer: MEDICARE | Attending: Internal Medicine

## 2023-09-17 ENCOUNTER — Encounter: Admit: 2023-09-18 | Discharge: 2018-09-23 | Payer: MEDICARE | Attending: Registered Nurse

## 2023-09-17 ENCOUNTER — Encounter: Admit: 2023-09-17 | Payer: MEDICARE

## 2023-09-17 MED FILL — AMPICILLIN SODIUM 250 MG IJ SOLR: 250 MG | Injection | Qty: 20

## 2023-09-17 MED FILL — DEXMEDETOMIDINE HCL IN NACL 200-0.9 MCG/50ML-% IV SOLN: 200-0.9 MCG/50ML-% | INTRAVENOUS | Qty: 50

## 2023-09-17 MED FILL — CEFAZOLIN SODIUM 1 GM IJ SOLR: 1 g | INTRAMUSCULAR | Qty: 1000

## 2023-09-17 MED FILL — HUMULIN R 100 UNIT/ML IJ SOLN: 100 UNIT/ML | INTRAMUSCULAR | Qty: 100

## 2023-09-17 MED FILL — AMINOCAPROIC ACID 250 MG/ML IV SOLN: 250 MG/ML | INTRAVENOUS | Qty: 120

## 2023-09-17 MED FILL — AMINO ACID 15 % IV SOLN: 15 % | IntraVENous | Qty: 250

## 2023-09-17 MED FILL — PAPAVERINE HCL 30 MG/ML IJ SOLN: 30 MG/ML | INTRAMUSCULAR | Qty: 4

## 2023-09-17 MED FILL — METOPROLOL TARTRATE 25 MG OR TABS: 25 MG | ORAL | Qty: 1

## 2023-09-17 MED FILL — SOLU-CORTEF 100 MG IJ SOLR: 100 MG | INTRAMUSCULAR | Qty: 2

## 2023-09-17 MED FILL — ADENOSINE (DIAGNOSTIC) 3 MG/ML IV SOLN: 3 MG/ML | INTRAVENOUS | Qty: 13.3

## 2023-09-17 MED FILL — PLASBUMIN-5 5 % IV SOLN: 5 % | INTRAVENOUS | Qty: 250

## 2023-09-17 MED FILL — VASOPRESSIN 20 UNIT/ML IV SOLN: 20 units/mL | INTRAVENOUS | Qty: 1

## 2023-09-17 MED FILL — NOREPINEPHRINE 4 MCG/ML IN SODIUM CHLORIDE INJECTION (UCI): 4 mCg/mL | INTRAVENOUS | Qty: 30

## 2023-09-17 MED FILL — LEVOTHYROXINE SODIUM 100 MCG IV SOLR: 100 MCG | IntraVENous | Qty: 13.3

## 2023-09-17 MED FILL — HEPARIN SODIUM (PORCINE) 5000 UNIT/ML IJ SOLN: 5000 UNIT/ML | INTRAMUSCULAR | Qty: 1

## 2023-09-17 MED FILL — ACETAMINOPHEN 10 MG/ML IV SOLN: 10 MG/ML | INTRAVENOUS | Qty: 100

## 2023-09-17 MED FILL — HUMULIN R 100 UNIT/ML IJ SOLN: 100 UNIT/ML | INTRAMUSCULAR | Qty: 1

## 2023-09-17 MED FILL — PROPOFOL 1000 MG/100ML IV EMUL: 1000 MG/100ML | INTRAVENOUS | Qty: 100

## 2023-09-18 ENCOUNTER — Encounter: Admit: 2023-09-18 | Discharge: 2018-03-25 | Payer: MEDICARE | Attending: Obstetrics & Gynecology

## 2023-09-18 MED FILL — GERI-KOT 8.6 MG OR TABS: 8.6 MG | ORAL | Qty: 1

## 2023-09-18 MED FILL — VASOPRESSIN 20 UNIT/ML IV SOLN: 20 units/mL | INTRAVENOUS | Qty: 1

## 2023-09-18 MED FILL — FLUZONE HIGH-DOSE 0.5 ML IM SUSY: 0.5 ML | INTRAMUSCULAR | Qty: 0.5

## 2023-09-18 MED FILL — ASPIRIN 81 MG OR TBEC: 81 MG | ORAL | Qty: 2

## 2023-09-18 MED FILL — METHOCARBAMOL 500 MG OR TABS: 500 MG | ORAL | Qty: 1

## 2023-09-18 MED FILL — IPRATROPIUM-ALBUTEROL 0.5-2.5 (3) MG/3ML IN SOLN: 0.5-3 MG/3ML | RESPIRATORY_TRACT | Qty: 1

## 2023-09-18 MED FILL — STOOL SOFTENER 250 MG OR CAPS: 250 MG | ORAL | Qty: 1

## 2023-09-18 MED FILL — FAMOTIDINE (PF) 20 MG/2ML IV SOLN: 20 MG/2ML | INTRAVENOUS | Qty: 2

## 2023-09-18 MED FILL — DILAUDID 1 MG/ML IJ SOLN: 1 MG/ML | INTRAMUSCULAR | Qty: 0.5

## 2023-09-18 MED FILL — LEVOTHROID 75 MCG PO TABS: 75 MCG | Oral | Qty: 2

## 2023-09-18 MED FILL — ZYPREXA 5 MG PO TABS: 5 MG | Oral | Qty: 100

## 2023-09-18 MED FILL — VITAMIN D3 25 MCG (1000 UT) PO TABS: 25 MCG | Oral | Qty: 0.5

## 2023-09-18 MED FILL — PLASBUMIN-5 5 % IV SOLN: 5 % | INTRAVENOUS | Qty: 250

## 2023-09-18 MED FILL — CLINIMIX/DEXTROSE (5/20) 5 % IV SOLN: 5 % | IntraVENous | Qty: 4000

## 2023-09-18 MED FILL — CEFEPIME HCL 2 GM IV SOLR: 2 g | INTRAVENOUS | Qty: 2000

## 2023-09-18 MED FILL — PREVNAR 20 0.5 ML IM SUSY: 0.5 ML | INTRAMUSCULAR | Qty: 0.5

## 2023-09-18 MED FILL — GABAPENTIN 100 MG OR CAPS: 100 MG | ORAL | Qty: 1

## 2023-09-18 MED FILL — NOREPINEPHRINE-SODIUM CHLORIDE 4-0.9 MG/250ML-% IV SOLN: 4-0.9 MG/250ML-% | INTRAVENOUS | Qty: 250

## 2023-09-18 MED FILL — OPTIRAY 320 68 % IV SOLN: 68 % | IntraVENous | Qty: 50

## 2023-09-18 MED FILL — LEVOTHYROXINE SODIUM 100 MCG IV SOLR: 100 MCG | IntraVENous | Qty: 1

## 2023-09-19 ENCOUNTER — Encounter: Admit: 2023-09-19 | Payer: MEDICARE

## 2023-09-19 MED FILL — ATORVASTATIN CALCIUM 40 MG OR TABS: 40 MG | ORAL | Qty: 1

## 2023-09-19 MED FILL — CEFEPIME HCL 2 GM IV SOLR: 2 g | INTRAVENOUS | Qty: 2000

## 2023-09-19 MED FILL — OCTREOTIDE ACETATE 500 MCG/ML IJ SOLN: 500 MCG/ML | Injection | Qty: 30

## 2023-09-19 MED FILL — FUROSEMIDE 10 MG/ML IJ SOLN: 10 MG/ML | INTRAMUSCULAR | Qty: 2

## 2023-09-19 MED FILL — METHIMAZOLE 10 MG OR TABS: 10 MG | ORAL | Qty: 2

## 2023-09-19 MED FILL — ONDANSETRON HCL 4 MG/2ML IV SOLN: 4 MG/2ML | INTRAMUSCULAR | Qty: 2

## 2023-09-19 MED FILL — MAGNESIUM SULFATE 2 GM/50ML IV SOLN: 2 GM/50ML | INTRAVENOUS | Qty: 50

## 2023-09-19 MED FILL — IPRATROPIUM-ALBUTEROL 0.5-2.5 (3) MG/3ML IN SOLN: 0.5-3 MG/3ML | RESPIRATORY_TRACT | Qty: 1

## 2023-09-19 MED FILL — AMARYL 2 MG PO TABS: 2 MG | Oral | Qty: 2

## 2023-09-19 MED FILL — DOK PLUS 50-8.6 MG PO TABS: 50-8.6 MG | Oral | Qty: 2

## 2023-09-19 MED FILL — POTASSIUM PHOSPHATES(66 MEQ K) 45 MMOLE/15ML IV SOLN: 45 MMOLE/15ML | INTRAVENOUS | Qty: 4.55

## 2023-09-19 MED FILL — SOLU-CORTEF 100 MG IJ SOLR: 100 MG | INTRAMUSCULAR | Qty: 2

## 2023-09-19 MED FILL — LOVENOX 40 MG/0.4ML SC SOLN: 40 MG/0.4ML | SubCUTAneous | Qty: 1

## 2023-09-20 ENCOUNTER — Encounter: Admit: 2023-09-20 | Payer: MEDICARE

## 2023-09-20 ENCOUNTER — Encounter: Admit: 2023-09-20 | Payer: MEDICARE | Attending: Surgery

## 2023-09-20 MED FILL — COUMADIN 2.5 MG PO TABS: 2.5 MG | Oral | Qty: 2

## 2023-09-20 MED FILL — HUMALOG 100 UNIT/ML SC SOLN: 100 UNIT/ML | SubCUTAneous | Qty: 1

## 2023-09-20 MED FILL — CEPACOL SORE THROAT MAX NUMB 15-3.6 MG MT LOZG: 15-3.6 MG | Mouth/Throat | Qty: 2

## 2023-09-20 MED FILL — CEFTRIAXONE SODIUM 1 G IJ SOLR: 1 g | Injection | Qty: 100

## 2023-09-20 MED FILL — RANEXA 500 MG PO TB12: 500 MG | Oral | Qty: 1

## 2023-09-20 MED FILL — HEPARIN (PORCINE) IN NACL 25000-0.45 UT/250ML-% IV SOLN: 25000-0.45 UT/250ML-% | INTRAVENOUS | Qty: 250

## 2023-09-20 MED FILL — GABAPENTIN 100 MG OR CAPS: 100 MG | ORAL | Qty: 1

## 2023-09-20 MED FILL — IPRATROPIUM-ALBUTEROL 0.5-2.5 (3) MG/3ML IN SOLN: 0.5-3 MG/3ML | RESPIRATORY_TRACT | Qty: 1

## 2023-09-20 MED FILL — STOOL SOFTENER 250 MG OR CAPS: 250 MG | ORAL | Qty: 1

## 2023-09-20 MED FILL — GERI-KOT 8.6 MG OR TABS: 8.6 MG | ORAL | Qty: 1

## 2023-09-20 MED FILL — SENNA PLUS 8.6-50 MG PO TABS: 8.6-50 MG | Oral | Qty: 1

## 2023-09-20 MED FILL — DOXY 100 100 MG IV SOLR: 100 MG | IntraVENous | Qty: 100

## 2023-09-20 MED FILL — ONDANSETRON HCL 4 MG/2ML IV SOLN: 4 MG/2ML | INTRAMUSCULAR | Qty: 2

## 2023-09-20 MED FILL — FUROSEMIDE 10 MG/ML IJ SOLN: 10 MG/ML | INTRAMUSCULAR | Qty: 4

## 2023-09-20 MED FILL — PROTONIX 40 MG IV SOLR: 40 MG | IntraVENous | Qty: 1

## 2023-09-20 MED FILL — FAMOTIDINE 20 MG OR TABS: 20 MG | ORAL | Qty: 1

## 2023-09-21 ENCOUNTER — Encounter: Admit: 2023-09-21 | Payer: MEDICARE

## 2023-09-21 MED FILL — MARCAINE PRESERVATIVE FREE 0.5 % IJ SOLN: 0.5 % | Injection | Qty: 1

## 2023-09-21 MED FILL — METHOCARBAMOL 500 MG OR TABS: 500 MG | ORAL | Qty: 1

## 2023-09-21 MED FILL — IPRATROPIUM-ALBUTEROL 0.5-2.5 (3) MG/3ML IN SOLN: 0.5-3 MG/3ML | RESPIRATORY_TRACT | Qty: 1

## 2023-09-21 MED FILL — STOOL SOFTENER 250 MG OR CAPS: 250 MG | ORAL | Qty: 1

## 2023-09-21 MED FILL — FAMOTIDINE 20 MG OR TABS: 20 MG | ORAL | Qty: 1

## 2023-09-21 MED FILL — CHEST CONGESTION RELIEF DM 20-400 MG PO TABS: 20-400 MG | Oral | Qty: 1

## 2023-09-21 MED FILL — PREDNISONE 10 MG OR TABS: 10 MG | ORAL | Qty: 3

## 2023-09-21 MED FILL — MAGNESIUM SULFATE 2 GM/50ML IV SOLN: 2 GM/50ML | INTRAVENOUS | Qty: 50

## 2023-09-21 MED FILL — MELATONIN 1 MG OR TABS: 1 MG | ORAL | Qty: 2

## 2023-09-21 MED FILL — OPTIRAY 320 68 % IV SOLN: 68 % | IntraVENous | Qty: 1

## 2023-09-21 MED FILL — METHIMAZOLE 10 MG OR TABS: 10 MG | ORAL | Qty: 2

## 2023-09-21 MED FILL — GABAPENTIN 100 MG OR CAPS: 100 MG | ORAL | Qty: 1

## 2023-09-21 MED FILL — DOK 100 MG PO CAPS: 100 MG | Oral | Qty: 1

## 2023-09-21 MED FILL — FUROSEMIDE 10 MG/ML IJ SOLN: 10 MG/ML | INTRAMUSCULAR | Qty: 4

## 2023-09-21 MED FILL — LINZESS 145 MCG PO CAPS: 145 MCG | Oral | Qty: 50

## 2023-09-21 MED FILL — CALCIUM GLUCONATE-NACL 2-0.675 GM/100ML-% IV SOLN: 2 g/100 mL | INTRAVENOUS | Qty: 100

## 2023-09-22 ENCOUNTER — Encounter: Admit: 2023-09-22 | Payer: MEDICARE

## 2023-09-22 MED FILL — ATORVASTATIN CALCIUM 40 MG OR TABS: 40 MG | ORAL | Qty: 1

## 2023-09-22 MED FILL — NICARDIPINE HCL IN NACL 40-0.9 MG/200ML-% IV SOLN: 40-0.9 MG/200ML-% | INTRAVENOUS | Qty: 200

## 2023-09-22 MED FILL — CALCIUM GLUCONATE-NACL 2-0.675 GM/100ML-% IV SOLN: 2 g/100 mL | INTRAVENOUS | Qty: 100

## 2023-09-22 MED FILL — VASOPRESSIN 20 UNIT/ML IV SOLN: 20 units/mL | INTRAVENOUS | Qty: 1

## 2023-09-22 MED FILL — METOPROLOL TARTRATE 12.5 MG OR TABS (HALF TABLET): 12.5 MG | ORAL | Qty: 1

## 2023-09-22 MED FILL — MAGNESIUM SULFATE 4 GM/50ML IV SOLN: 4 GM/50ML | INTRAVENOUS | Qty: 50

## 2023-09-22 MED FILL — KLOR-CON 10 10 MEQ PO TBCR: 10 MEQ | Oral | Qty: 1

## 2023-09-22 MED FILL — BUMINATE 5 % IV SOLN: 5 % | INTRAVENOUS | Qty: 500

## 2023-09-22 MED FILL — FUROSEMIDE 10 MG/ML IJ SOLN: 10 MG/ML | INTRAMUSCULAR | Qty: 6

## 2023-09-22 MED FILL — MELATONIN 1 MG OR TABS: 1 MG | ORAL | Qty: 2

## 2023-09-22 MED FILL — ZITHROMAX 500 MG IV SOLR: 500 MG | IntraVENous | Qty: 1

## 2023-09-22 MED FILL — FAMOTIDINE 20 MG OR TABS: 20 MG | ORAL | Qty: 1

## 2023-09-22 MED FILL — POTASSIUM CHLORIDE 20 MEQ/100ML IV SOLN: 20 MEQ/100ML | INTRAVENOUS | Qty: 100

## 2023-09-23 ENCOUNTER — Encounter: Admit: 2023-09-23 | Payer: MEDICARE | Attending: Family Medicine

## 2023-09-23 MED FILL — ASPIRIN LOW STRENGTH 81 MG PO CHEW: 81 MG | Oral | Qty: 1

## 2023-09-23 MED FILL — ELIQUIS 5 MG PO TABS: 5 MG | Oral | Qty: 100

## 2023-09-23 MED FILL — ATORVASTATIN CALCIUM 40 MG OR TABS: 40 MG | ORAL | Qty: 1

## 2023-09-23 MED FILL — MELATONIN 1 MG OR TABS: 1 MG | ORAL | Qty: 2

## 2023-09-23 MED FILL — OXYCODONE HCL 5 MG OR TABS: 5 MG | ORAL | Qty: 1

## 2023-09-23 MED FILL — MAPAP 500 MG PO TABS: 500 MG | Oral | Qty: 1

## 2023-09-23 MED FILL — AZITHROMYCIN 500 MG IV SOLR: 500 MG | IntraVENous | Qty: 1

## 2023-09-23 MED FILL — FAMOTIDINE 20 MG OR TABS: 20 MG | ORAL | Qty: 1

## 2023-09-23 MED FILL — FUROSEMIDE 10 MG/ML IJ SOLN: 10 MG/ML | INTRAMUSCULAR | Qty: 6

## 2023-09-23 MED FILL — MILK OF MAGNESIA 7.75 % PO SUSP: 7.75 % | Oral | Qty: 1

## 2023-09-23 MED FILL — METHOCARBAMOL 500 MG OR TABS: 500 MG | ORAL | Qty: 1

## 2023-09-24 ENCOUNTER — Encounter: Admit: 2023-09-24 | Payer: MEDICARE

## 2023-09-24 MED FILL — IPRATROPIUM-ALBUTEROL 0.5-2.5 (3) MG/3ML IN SOLN: 0.5-3 MG/3ML | RESPIRATORY_TRACT | Qty: 1

## 2023-09-24 MED FILL — MAGNESIUM SULFATE 2 GM/50ML IV SOLN: 2 GM/50ML | INTRAVENOUS | Qty: 50

## 2023-09-24 MED FILL — METOPROLOL TARTRATE 25 MG OR TABS: 25 MG | ORAL | Qty: 1

## 2023-09-24 MED FILL — LIDOCAINE 5 % EX PTCH: 5 % | CUTANEOUS | Qty: 1

## 2023-09-24 MED FILL — GABAPENTIN 100 MG OR CAPS: 100 MG | ORAL | Qty: 1

## 2023-09-24 MED FILL — CLEOCIN IN D5W 300 MG/50ML IV SOLN: 300 MG/50ML | IntraVENous | Qty: 2

## 2023-09-24 MED FILL — KLOR-CON M20 20 MEQ OR TBCR: 20 MEQ | ORAL | Qty: 2

## 2023-09-24 MED FILL — DOK PLUS 50-8.6 MG PO TABS: 50-8.6 MG | Oral | Qty: 1000

## 2023-09-24 MED FILL — MONOJECT FLUSH SYRINGE 0.9 % IV SOLN: 0.9 % | IntraVENous | Qty: 1

## 2023-09-24 MED FILL — OXYCODONE HCL 5 MG OR TABS: 5 MG | ORAL | Qty: 1

## 2023-09-24 MED FILL — LOVENOX 40 MG/0.4ML SC SOLN: 40 MG/0.4ML | SubCUTAneous | Qty: 1

## 2023-09-24 MED FILL — FAMOTIDINE 20 MG OR TABS: 20 MG | ORAL | Qty: 1

## 2023-09-24 MED FILL — KLOR-CON M20 20 MEQ OR TBCR: 20 MEQ | ORAL | Qty: 1

## 2023-09-24 MED FILL — METOPROLOL TARTRATE 12.5 MG OR TABS (HALF TABLET): 12.5 MG | ORAL | Qty: 1

## 2023-09-25 ENCOUNTER — Encounter: Admit: 2023-09-25 | Discharge: 2018-04-04 | Payer: MEDICARE | Attending: Surgery

## 2023-09-25 ENCOUNTER — Encounter: Admit: 2023-09-25 | Payer: MEDICARE

## 2023-09-25 MED FILL — CALCIUM GLUCONATE-NACL 2-0.675 GM/100ML-% IV SOLN: 2 g/100 mL | INTRAVENOUS | Qty: 100

## 2023-09-25 MED FILL — PREDNISONE 10 MG OR TABS: 10 MG | ORAL | Qty: 3

## 2023-09-25 MED FILL — WELCHOL 625 MG PO TABS: 625 MG | Oral | Qty: 100

## 2023-09-25 MED FILL — METHOCARBAMOL 500 MG OR TABS: 500 MG | ORAL | Qty: 1

## 2023-09-25 MED FILL — ZOSYN 4-0.5 GM/100ML IV SOLN: 4-0.5 GM/100ML | INTRAVENOUS | Qty: 100

## 2023-09-25 MED FILL — METHIMAZOLE 10 MG OR TABS: 10 MG | ORAL | Qty: 1

## 2023-09-25 MED FILL — MUPIROCIN 2 % EX OINT: 2 % | CUTANEOUS | Qty: 1

## 2023-09-25 MED FILL — MAGNESIUM SULFATE 2 GM/50ML IV SOLN: 2 GM/50ML | INTRAVENOUS | Qty: 50

## 2023-09-25 MED FILL — HEPARIN (PORCINE) IN NACL 25000-0.45 UT/250ML-% IV SOLN: 25000-0.45 UT/250ML-% | INTRAVENOUS | Qty: 250

## 2023-09-25 MED FILL — OXYCODONE HCL 5 MG OR TABS: 5 MG | ORAL | Qty: 1

## 2023-09-25 MED FILL — TYLENOL 325 MG OR TABS: 325 MG | ORAL | Qty: 2

## 2023-09-25 MED FILL — AMINO ACID 15 % IV SOLN: 15 % | IntraVENous | Qty: 1

## 2023-09-25 MED FILL — KLOR-CON M20 20 MEQ OR TBCR: 20 MEQ | ORAL | Qty: 1

## 2023-09-25 MED FILL — LACTULOSE 20 GM/30ML PO SOLN: 20 GM/30ML | Oral | Qty: 1

## 2023-09-25 MED FILL — VANCOMYCIN HCL IN NACL 750-0.9 MG/150ML-% IV SOLN: 750 mg/150 mL | INTRAVENOUS | Qty: 150

## 2023-09-26 MED FILL — LOVENOX 40 MG/0.4ML SC SOLN: 40 MG/0.4ML | SubCUTAneous | Qty: 25

## 2023-09-26 MED FILL — METOPROLOL TARTRATE 12.5 MG OR TABS (HALF TABLET): 12.5 MG | ORAL | Qty: 1

## 2023-09-26 MED FILL — IPRATROPIUM-ALBUTEROL 0.5-2.5 (3) MG/3ML IN SOLN: 0.5-3 MG/3ML | RESPIRATORY_TRACT | Qty: 1

## 2023-09-26 MED FILL — METHOCARBAMOL 500 MG OR TABS: 500 MG | ORAL | Qty: 1

## 2023-09-26 MED FILL — DIPRIVAN 200 MG/20ML IV EMUL: 200 MG/20ML | IntraVENous | Qty: 1

## 2023-09-26 MED FILL — GABAPENTIN 100 MG OR CAPS: 100 MG | ORAL | Qty: 1

## 2023-09-26 MED FILL — FAMOTIDINE 20 MG OR TABS: 20 MG | ORAL | Qty: 1

## 2023-10-04 NOTE — Telephone Encounter (Signed)
 Received donor participation forms from [facility] for patient. Forms are attached to this encounter as well as placed in Dr. Jacqulyn Ducking mailbox for completion.

## 2023-10-05 NOTE — Telephone Encounter (Signed)
 Scanned & faxed.

## 2023-10-05 NOTE — Telephone Encounter (Signed)
 Completed, we will have to print out patient's medication list and attach it to this form as well. Thank you

## 2023-10-08 ENCOUNTER — Encounter: Admit: 2023-10-08 | Discharge: 2018-05-23 | Payer: MEDICARE | Attending: Physician Assistant

## 2023-10-09 ENCOUNTER — Encounter: Admit: 2023-10-09 | Payer: MEDICARE

## 2023-10-11 ENCOUNTER — Ambulatory Visit: Admit: 2023-10-11 | Discharge: 2023-10-11 | Payer: BLUE CROSS/BLUE SHIELD

## 2023-10-11 VITALS — BP 110/74 | HR 79 | Temp 98.20000°F | Resp 12 | Ht 64.02 in | Wt 178.0 lb

## 2023-10-11 DIAGNOSIS — F3132 Bipolar disorder, current episode depressed, moderate: Secondary | ICD-10-CM

## 2023-10-11 MED ORDER — METRONIDAZOLE 500 MG PO TABS
500 | ORAL_TABLET | Freq: Two times a day (BID) | ORAL | 0 refills | Status: AC
Start: 2023-10-11 — End: 2023-10-18

## 2023-10-11 MED ORDER — QUETIAPINE FUMARATE 25 MG PO TABS
25 | ORAL_TABLET | Freq: Every day | ORAL | 1 refills | Status: AC
Start: 2023-10-11 — End: ?

## 2023-10-11 MED ORDER — BUSPIRONE HCL 10 MG PO TABS
10 MG | ORAL_TABLET | Freq: Two times a day (BID) | ORAL | 0 refills | Status: DC
Start: 2023-10-11 — End: 2023-11-11

## 2023-10-11 NOTE — Progress Notes (Signed)
 [facility]  [facility]  [address]  [place] [address]  Dept: [phone]  Dept Fax: [phone]  Loc: [phone]    HPI:     [name] is a 41 y.o. female who presents tod

## 2023-10-11 NOTE — Progress Notes (Signed)
 Health Maintenance Due   Topic Date Due    Varicella vaccine (1 of 2 - 13+ 2-dose series) Never done    Hepatitis B vaccine (1 of 3 - 19+ 3-dose series) Never done    DTaP/Tdap/Td vaccine (1 - Tdap) Never done    Breast cancer screen  Never done    Flu vac

## 2023-10-11 NOTE — Patient Instructions (Signed)
 Thank you   Thank you for trusting [place] with your healthcare needs. You may receive a survey regarding today's visit. It would help [place] out if you would take a few moments to provide your feedback. We value your input.  Please bring in ALL medications BOTTLES

## 2023-10-11 NOTE — Progress Notes (Signed)
 S: 41 y.o. female with   Chief Complaint   Patient presents with    Vaginal Odor     Abnormal Odor first noticed 2 weeks ago       HPI: please see resident note for HPI and ROS.    BP Readings from Last 3 Encounters:   10/11/23 110/74   07/13/23 116/84   0

## 2023-10-12 ENCOUNTER — Encounter: Admit: 2023-10-12 | Discharge: 2018-05-25 | Payer: MEDICARE | Attending: Registered Nurse

## 2023-10-13 ENCOUNTER — Encounter: Admit: 2023-10-13 | Payer: MEDICARE

## 2023-10-14 LAB — C.TRACHOMATIS N.GONORRHOEAE DNA
C. Trachomatis Amplified: NEGATIVE
NEISSERIA GONORRHOEAE BY AMP: NEGATIVE

## 2023-10-14 NOTE — Other (Signed)
Chlamydia and gonorrhea both negative.  TM

## 2023-10-15 NOTE — Telephone Encounter (Signed)
-----   Message from Dr. Verlee Monte, DO sent at 10/14/2023 11:46 PM EST -----  Chlamydia and gonorrhea both negative.  TM

## 2023-10-18 ENCOUNTER — Encounter: Admit: 2023-10-18 | Payer: MEDICARE

## 2023-10-25 ENCOUNTER — Encounter: Admit: 2023-10-25 | Payer: MEDICARE | Attending: Gastroenterology

## 2023-10-25 ENCOUNTER — Encounter: Admit: 2018-07-11 | Discharge: 2018-07-11 | Payer: MEDICARE | Attending: Family Medicine

## 2023-10-25 ENCOUNTER — Ambulatory Visit: Admit: 2023-10-25 | Discharge: 2023-10-25 | Payer: MEDICARE

## 2023-10-25 ENCOUNTER — Encounter: Payer: MEDICARE | Attending: Rehabilitative and Restorative Service Providers"

## 2023-10-25 VITALS — BP 112/78 | HR 88

## 2023-10-25 VITALS — BP 104/69 | HR 118 | Temp 98.00000°F | Resp 16 | Ht 60.98 in | Wt 112.2 lb

## 2023-10-25 DIAGNOSIS — E440 Moderate protein-calorie malnutrition: Secondary | ICD-10-CM

## 2023-10-25 DIAGNOSIS — I509 Heart failure, unspecified: Secondary | ICD-10-CM

## 2023-10-29 ENCOUNTER — Telehealth: Admit: 2023-10-29 | Discharge: 2018-04-26 | Payer: MEDICARE

## 2023-10-29 DIAGNOSIS — Z09 Encounter for follow-up examination after completed treatment for conditions other than malignant neoplasm: Secondary | ICD-10-CM

## 2023-11-07 ENCOUNTER — Encounter: Admit: 2023-11-07 | Discharge: 2018-06-08 | Payer: MEDICARE | Attending: Internal Medicine

## 2023-11-07 ENCOUNTER — Observation Stay: Admit: 2023-11-07 | Payer: MEDICARE

## 2023-11-07 ENCOUNTER — Inpatient Hospital Stay: Admit: 2023-11-07 | Discharge: 2023-11-09 | Payer: MEDICARE

## 2023-11-07 ENCOUNTER — Encounter: Payer: MEDICARE | Attending: Nurse Practitioner

## 2023-11-07 LAB — DIFFERENTIAL - UCI
Basophils %: 0.4 % (ref 0.0–2.0)
Eosinophils %: 1.5 % (ref 0.0–7.0)
Eosinophils Absolute: 0.1 10*3/uL (ref 0.0–0.5)
Monocytes Absolute: 0.4 10*3/uL (ref 0.0–0.8)

## 2023-11-07 LAB — BLOOD GAS + ELECTROLYTES, VENOUS - UCI
Bicarb: 21.7 mmol/L (ref 21.0–27.0)
Calcium Ionized: 1.16 mmol/L (ref 1.13–1.32)
Carboxy Hemoglobin: 1.1 % (ref 0.5–1.5)
O2 Saturation: 56.5 % (ref 40.0–52.0)
Potassium, Whole Blood: 4.1 mmol/L (ref 3.7–5.5)
Total CO2: 23 mmol/L (ref 23–29)

## 2023-11-07 LAB — COMPREHENSIVE METABOLIC PANEL, BLOOD - UCI
Albumin: 4.3 g/dL (ref 3.7–5.3)
Alkaline Phosphatase: 153 U/L — ABNORMAL HIGH (ref 34–104)
BUN: 11 mg/dL (ref 7–25)
Bilirubin, Total: 0.7 mg/dL (ref <=1.4)
Electrolyte Balance: 9 mmol/L (ref 2–12)
Protein, Total: 9.6 g/dL — ABNORMAL HIGH (ref 6.0–8.3)

## 2023-11-07 LAB — HEMOGRAM, BLOOD - UCI
HGB: 12.6 g/dL (ref 11.5–15.0)
MCV: 99.5 fl — ABNORMAL HIGH (ref 81.5–97.0)
PLT Count: 189 10*3/uL (ref 150–400)

## 2023-11-07 LAB — MAGNESIUM, BLOOD - UCI: Magnesium: 2 mg/dL (ref 1.9–2.7)

## 2023-11-07 LAB — PT/INR/PTT - UCI
INR: 0.96 mg/dL (ref 0.88–1.12)
Prothrombin Time: 11.2 s (ref 10.2–12.9)

## 2023-11-07 LAB — COVID/FLU A+B/RSV PANEL, PCR - UCI
Influenza A, PCR: NOT DETECTED mmol/L
Influenza B, PCR: NOT DETECTED ng/mL

## 2023-11-07 LAB — EXTRA RED - UCI

## 2023-11-07 LAB — TROPONIN I, HIGH SENSITIVITY - UCI: Troponin I, High Sensitivity: 47 ng/L — ABNORMAL HIGH (ref 0–15)

## 2023-11-08 ENCOUNTER — Encounter: Admit: 2023-11-08 | Payer: MEDICARE

## 2023-11-08 LAB — BNP, BLOOD - UCI: BNP: 1309 pg/mL — ABNORMAL HIGH (ref <=100)

## 2023-11-08 LAB — HEMOGRAM, BLOOD - UCI
MCHC: 33.7 g/dL (ref 32.0–35.5)
MCV: 99.5 fl — ABNORMAL HIGH (ref 81.5–97.0)
MPV: 9.9 fl (ref 7.2–11.7)
RBC: 3.07 10*6/uL — ABNORMAL LOW (ref 3.70–5.00)

## 2023-11-08 LAB — BASIC METABOLIC PANEL, BLOOD - UCI
BUN: 9 mg/dL (ref 7–25)
Calcium: 8.8 mg/dL (ref 8.6–10.3)
Carbon Dioxide: 23 mmol/L (ref 21–31)
Chloride: 103 mmol/L (ref 98–107)
Electrolyte Balance: 11 mmol/L (ref 2–12)
eGFR: >120 mL/min/{1.73_m2} (ref >=60)

## 2023-11-08 LAB — COMPLETE 2D ECHO WITH STRAIN
LA Volume Index: 32.5 mL/m2 (ref 0.0–0.2)
LV Diastolic Diameter: 5.01 cm (ref 40.0–50.0)
LV Ejection Fraction: 23.7 % (ref 135–145)
TR Velocity: 2.88 m/s (ref 98–107)

## 2023-11-08 LAB — DIFFERENTIAL - UCI
Basophils %: 1 % (ref 0.0–2.0)
Eosinophils %: 0.7 % (ref 0.0–7.0)
Monocytes Absolute: 0.4 10*3/uL (ref 0.0–0.8)
Neutrophils %: 39.1 % (ref 39.0–88.0)

## 2023-11-08 LAB — TOTAL PROTEIN, OTHER - UCI: Protein Total, Miscellaneous Fluid: 4.1 g/dL (ref 4–16)

## 2023-11-08 LAB — LDH, BLOOD - UCI: Lactate Dehydrogenase: 352 U/L — ABNORMAL HIGH (ref 96–199)

## 2023-11-08 LAB — BODY FLUID DIFFERENTIAL - UCI
Lymphocytes % - Fld: 88 % (ref 0–10)
Other Cells % - Fld: 2 % (ref 7–16)

## 2023-11-08 LAB — ARTERIAL BLOOD GAS - UCI
Bicarb: 20.7 mmol/L — ABNORMAL LOW (ref 21.0–27.0)
Inspired O2: 21
pO2: 170.1 mmHg — ABNORMAL HIGH (ref 80.0–104.0)

## 2023-11-08 LAB — BODY FLUID COUNT - UCI
Nucleated Cells - Fld: 344
RBC - Fld: 4310 mg/L (ref 5.7–26.3)

## 2023-11-08 LAB — LDH, OTHER - UCI: LDH, Miscellaneous Fluid: 201 U/L

## 2023-11-09 ENCOUNTER — Encounter: Admit: 2018-06-16 | Discharge: 2018-06-16 | Payer: MEDICARE

## 2023-11-11 ENCOUNTER — Encounter

## 2023-11-11 MED ORDER — BUSPIRONE HCL 10 MG PO TABS
10 | ORAL_TABLET | Freq: Two times a day (BID) | ORAL | 0 refills | Status: AC
Start: 2023-11-11 — End: ?

## 2023-11-11 NOTE — Telephone Encounter (Signed)
Patient's last appointment was: 10/11/2023  with our Dr.Munshi  Patient's next appointment is: 11/18/2023  with our Dr. Juel Burrow  Last refilled on: 10/11/2023  Which pharmacy does the script need sent to: [facility]       Lab Results   Component Value Date    LABA1C 5.1 06/04/2023     Lab Results   Component Value Date    CHOL 152 09/07/2015    TRIG 61 09/07/2015    HDL 75 06/04/2023     Lab Results   Component Value Date    NA 139 04/16/2023    K 4.3 04/16/2023    CL 105 04/16/2023    CO2 22 (L) 04/16/2023    BUN 10 04/16/2023    CREATININE 1.0 04/16/2023    GLUCOSE 83 04/16/2023    CALCIUM 8.3 (L) 04/16/2023    BILITOT 0.2 (L) 01/28/2021    ALKPHOS 70 01/28/2021    AST 13 01/28/2021    ALT 10 (L) 01/28/2021    LABGLOM 72 04/16/2023     Lab Results   Component Value Date    TSH 2.120 06/04/2023    T3TOTAL 93 01/23/2016    T4FREE 1.30 01/28/2021     Lab Results   Component Value Date    WBC 5.1 04/16/2023    HGB 10.0 (L) 04/16/2023    HCT 32.7 (L) 04/16/2023    MCV 87.9 04/16/2023    PLT 402 (H) 04/16/2023

## 2023-11-12 ENCOUNTER — Ambulatory Visit: Admit: 2023-11-12 | Payer: MEDICARE

## 2023-12-06 MED ORDER — METOPROLOL SUCCINATE 25 MG OR TB24
25 | ORAL_TABLET | Freq: Every day | ORAL | 3 refills | Status: DC
Start: 2023-12-06 — End: 2024-01-05

## 2024-01-05 MED ORDER — LOSARTAN POTASSIUM 25 MG OR TABS
25 | ORAL_TABLET | Freq: Every day | ORAL | 3 refills | Status: AC
Start: 2024-01-05 — End: ?

## 2024-01-05 MED ORDER — METOPROLOL SUCCINATE 25 MG OR TB24
25 | ORAL_TABLET | Freq: Every day | ORAL | 3 refills | Status: AC
Start: 2024-01-05 — End: ?

## 2024-01-12 LAB — TSH, BLOOD - UCI: TSH, Ultrasensitive: 2.92 u[IU]/mL (ref 0.450–4.120)

## 2024-01-12 LAB — THYROXINE(T4), BLOOD - UCI: Total T4 (Thyroxine): 9 ug/dL (ref 4.5–12.0)

## 2024-01-12 LAB — FREE THYROXINE, BLOOD - UCI: Free T4: 0.78 ng/dL (ref 0.60–1.12)

## 2024-01-19 LAB — THYROID STIMULATING IMMUNOGLOBULIN, SERUM - UCI: Thyroid Stimulating Immunoglobulin: <1 {TSI_index} (ref <=1.3)

## 2024-01-24 ENCOUNTER — Ambulatory Visit: Admit: 2024-01-24 | Discharge: 2024-01-24 | Payer: MEDICAID

## 2024-01-24 DIAGNOSIS — E162 Hypoglycemia, unspecified: Secondary | ICD-10-CM

## 2024-01-24 MED ORDER — KETOCONAZOLE 1 % EX SHAM
1 | CUTANEOUS | 1 refills | Status: AC
Start: 2024-01-24 — End: ?

## 2024-01-24 NOTE — Progress Notes (Signed)
 [facility]   [facility]  [address]  [place] [address]  Dept: [phone]  Dept Fax: [phone]  Loc: [phone]      [name] is a 43 y.o. female who presents todayfor her medical conditions/complaints as noted below.  [name] is c/o of Other (Patient would like information on reactive Hypoglycemia. Patient also states she has a bald spot on the top of her head, and it is getting sore and bigger. )      :     Last seen in September 2024    Bipolar 1 disorder: Remains on Lexapro, Seroquel and as needed BuSpar, tolerating well with no acute concerns    Bald spots has noticed since 2020, now painful as well. Rosemary oil, ginger oil, biotin vitamins have not made any improvements.  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 Done in 2022, recently having more episodes. Randomly occurs.  Has not had micronutrient check in a period of time.  Patient also feels that she has reactive hypoglycemia, she states that her sugar levels will fluctuate greatly during the day especially in the mornings.  She has been trying to monitor what she eats and increase her sugar intake/carb intake when her sugars are low.    Patient Active Problem List   Diagnosis    Fatigue    SOB (shortness of breath)    Anemia    Intestinal malabsorption    Deficiency of multiple nutrient elements    Lower abdominal pain    Chronic abdominal pain    Nausea    Abnormal CT of the abdomen    History of gastric bypass    Obesity    Dilation of small bowel anastomosis    Constipation    Low back pain    Night sweats    Numbness of fingers of both hands    Endometriosis      Goals    None       The patient is allergic to amoxicillin, pcn [penicillins], and zoloft [sertraline hcl].    Medical History  [name] has a past medical history of Anemia, Anxiety, Asthma, Bipolar  disorder (HCC), Chronic back pain, Deficiency of multiple nutrient elements, Deficiency of multiple vitamins, Depression, Fatigue, GERD (gastroesophageal reflux disease), Hypoglycemia, Intestinal malabsorption, Irregular menstrual cycle, Knee pain, Obesity, Partial small bowel obstruction (HCC), Post-op pain, Restless legs syndrome, SBO (small bowel obstruction) (HCC), Sleep apnea, and SOB (shortness of breath).    Past SurgicalHistory  The patient  has a past surgical history that includes Cesarean section (11/03/2003); Hysterectomy (11/02/2010); salpingectomy (11/02/2006); Cholecystectomy (11/02/2004); Gastric bypass surgery (10/09/2013); Breast lumpectomy (Left, 11/02/1998); other surgical history (09/07/2020); Endoscopy, colon, diagnostic; Colonoscopy; Ectopic pregnancy surgery; polypectomy; Jejunostomy (12/17/2020); Small intestine surgery (12/17/2020); Tubal ligation; hernia repair; Hysterectomy, total abdominal; and Upper gastrointestinal endoscopy.    Family History  This patient's family history includes Alcohol Abuse in her father and mother; Asthma in her brother; Breast Cancer in her maternal aunt and another family member; Cancer in her maternal uncle and maternal uncle; Coronary Art Dis in her mother; Depression in her father, mother, and sister; Diabetes in her father; Early Death in her father and mother; Heart Attack in her mother; Heart Disease in her brother, mother, and sister; High Blood Pressure in her father, mother, and sister; Mental Illness  in her father, mother, and sister; Obesity in her brother, mother, and sister; Prostate Cancer in her father; Thyroid Disease in her mother and sister.    Social History  [name]  reports that she has never smoked. She has never used smokeless tobacco. She reports current alcohol use of about 3.0 standard drinks of alcohol per week. She reports that she does not use drugs.    Medications    Current Outpatient Medications:     KETOCONAZOLE, TOPICAL, 1 %  SHAM, Use while showering, massage into scalp., Disp: 125 mL, Rfl: 1    busPIRone (BUSPAR) 10 MG tablet, TAKE 1 TABLET BY MOUTH TWICE A DAY, Disp: 60 tablet, Rfl: 0    QUEtiapine (SEROQUEL) 25 MG tablet, Take 1 tablet by mouth daily, Disp: 60 tablet, Rfl: 1    escitalopram (LEXAPRO) 10 MG tablet, Take 1 tablet by mouth daily, Disp: , Rfl:     fluticasone (FLONASE) 50 MCG/ACT nasal spray, 2 sprays by Each Nostril route daily, Disp: 16 g, Rfl: 0    gabapentin (NEURONTIN) 100 MG capsule, Take 1 capsule by mouth daily for 180 days. Unsure of dose, Disp: 90 capsule, Rfl: 1    Subjective:      Review of Systems   Constitutional:  Negative for chills and fever.   Respiratory:  Negative for cough and shortness of breath.    Cardiovascular:  Negative for chest pain, palpitations and leg swelling.   Gastrointestinal:  Negative for abdominal pain, constipation, diarrhea and nausea.   Neurological:  Negative for light-headedness and headaches.       Objective:     Vitals:    01/24/24 1022   BP: 102/84   BP Site: Left Upper Arm   Patient Position: Sitting   BP Cuff Size: Medium Adult   Pulse: 67   Resp: 20   SpO2: 98%   Weight: 88.3 kg (194 lb 9.6 oz)   Height: 1.626 m (5' 4.02")       Physical Exam  Vitals reviewed.   Cardiovascular:      Rate and Rhythm: Normal rate and regular rhythm.      Pulses: Normal pulses.      Heart sounds: Normal heart sounds. No murmur heard.     No gallop.   Pulmonary:      Effort: Pulmonary effort is normal.      Breath sounds: Normal breath sounds. No wheezing, rhonchi or rales.   Abdominal:      General: There is no distension.      Palpations: Abdomen is soft.   Musculoskeletal:      Right lower leg: No edema.      Left lower leg: No edema.   Skin:     General: Skin is warm.   Neurological:      General: No focal deficit present.      Mental Status: She is alert.   Psychiatric:         Mood and Affect: Mood normal.         Behavior: Behavior normal.         Thought Content: Thought content  normal.         Judgment: Judgment normal.         Lab Results   Component Value Date    WBC 5.1 04/16/2023    HGB 10.0 (L) 04/16/2023    HCT 32.7 (L) 04/16/2023    PLT 402 (H) 04/16/2023    CHOL 152 09/07/2015  TRIG 61 09/07/2015    HDL 75 06/04/2023    ALT 10 (L) 01/28/2021    AST 13 01/28/2021    NA 139 04/16/2023    K 4.3 04/16/2023    CL 105 04/16/2023    CREATININE 1.0 04/16/2023    BUN 10 04/16/2023    CO2 22 (L) 04/16/2023    TSH 2.120 06/04/2023    LABA1C 5.1 06/04/2023       /Plan:      Diagnosis Orders   1. History of bariatric surgery  Copper, Serum    Folate    Ferritin    Iron    Vitamin B6    Vitamin B12    Vitamin B1    Vitamin A    Vitamin C    Zinc    Vitamin D 25 Hydroxy    CBC with Auto Differential    Comprehensive Metabolic Panel      2. Multiple episodes of hypoglycemia        3. Hair loss  KETOCONAZOLE, TOPICAL, 1 % SHAM      4. Alopecia  KETOCONAZOLE, TOPICAL, 1 % SHAM        History of bariatric surgery/hypoglycemia: Likely that hypoglycemia is related to her history of bariatric surgery, Roux-en-Y procedure done in 2022 in [place] area.  She has not had micronutrient check for a period of time, will proceed forward as above.  In the meantime patient to monitor what she eats and her sugar levels.  Continue to eat small frequent meals throughout the day.  Also encouraged probiotic use.    Hair loss/alopecia: Acute on chronic related issue, patchy areas of alopecia noted, will treat with ketoconazole shampoo as above and monitor.  If no improvement consider dermatology referral for possible intralesional steroid injections.    Patient to follow-up in 1 month.    Orders Placed   Orders Placed This Encounter   Procedures    Copper, Serum     Standing Status:   Future     Expected Date:   01/24/2024     Expiration Date:   01/23/2025    Folate     Standing Status:   Future     Expected Date:   01/24/2024     Expiration Date:   01/23/2025    Ferritin     Standing Status:   Future      Expected Date:   01/24/2024     Expiration Date:   01/23/2025    Iron     Standing Status:   Future     Expected Date:   01/24/2024     Expiration Date:   01/23/2025     Is Patient Fasting?:   yes     No of Hours?:   8    Vitamin B6     Standing Status:   Future     Expected Date:   01/24/2024     Expiration Date:   01/23/2025    Vitamin B12     Standing Status:   Future     Expected Date:   01/24/2024     Expiration Date:   01/23/2025    Vitamin B1     Standing Status:   Future     Expected Date:   01/24/2024     Expiration Date:   01/23/2025    Vitamin A     Standing Status:   Future     Expected Date:   01/24/2024  Expiration Date:   01/23/2025    Vitamin C     Standing Status:   Future     Expected Date:   01/24/2024     Expiration Date:   01/23/2025    Zinc     Standing Status:   Future     Expected Date:   01/24/2024     Expiration Date:   01/23/2025    Vitamin D 25 Hydroxy     Standing Status:   Future     Expected Date:   01/24/2024     Expiration Date:   01/23/2025    CBC with Auto Differential     Standing Status:   Future     Expected Date:   01/24/2024     Expiration Date:   01/23/2025    Comprehensive Metabolic Panel     Standing Status:   Future     Expected Date:   01/24/2024     Expiration Date:   01/23/2025       Prescriptions given/sent  Orders Placed This Encounter   Medications    KETOCONAZOLE, TOPICAL, 1 % SHAM     Sig: Use while showering, massage into scalp.     Dispense:  125 mL     Refill:  1       Patient given educational materials - see patient instructions.  Discussed use, benefit, and side effects of recommended medications.  All patient questions answered.  Pt voiced understanding.  Reviewed health maintenance.              Electronically signed by Jacklynn Barnacle, DO on 01/24/2024 at 11:53 AM

## 2024-01-24 NOTE — Progress Notes (Signed)
 S: 43 y.o. female with   Chief Complaint   Patient presents with    Other     Patient would like information on reactive Hypoglycemia. Patient also states she has a bald spot on the top of her head, and it is getting sore and bigger.        HPI: please see resident note for HPI and ROS.    BP Readings from Last 3 Encounters:   01/24/24 102/84   10/11/23 110/74   07/13/23 116/84     Wt Readings from Last 3 Encounters:   01/24/24 88.3 kg (194 lb 9.6 oz)   10/11/23 80.7 kg (178 lb)   07/13/23 86.9 kg (191 lb 9.6 oz)       O: VS:  height is 1.626 m (5' 4.02") and weight is 88.3 kg (194 lb 9.6 oz). Her blood pressure is 102/84 and her pulse is 67. Her respiration is 20 and oxygen saturation is 98%.   AAO/NAD, appropriate affect for mood  CV:  RRR, no murmur  Resp: CTAB       Diagnosis Orders   1. Multiple episodes of hypoglycemia        2. History of bariatric surgery  Copper, Serum    Folate    Ferritin    Iron    Vitamin B6    Vitamin B12    Vitamin B1    Vitamin A    Vitamin C    Zinc    Vitamin D 25 Hydroxy    CBC with Auto Differential    Comprehensive Metabolic Panel      3. Hair loss  KETOCONAZOLE, TOPICAL, 1 % SHAM      4. Alopecia  KETOCONAZOLE, TOPICAL, 1 % SHAM          Plan:  Please refer to resident note for full plan.    43 year old female presents the office for concerns.  Patient is a pertinent history of bariatric surgery approximately 3 years ago in [place].  Patient feels like more recently she has been having a lot of reactive hypoglycemia and muscle spasms.  Educated about small more frequent meals throughout the day.  Has not had a micronutrient evaluation in some time.  Educated importance about multivitamin usage as well as prebiotic use.  Will plan for micronutrient evaluation to her blood work as noted above and follow-up.  Patient is also experiencing some bald spots on the top of her head since about 2020.  She has been using some rosemary and ginger without success.  Seems to be related to  alopecia with unknown etiology.  May be fungal versus other.  Will trial antifungal shampoo first and then consider dermatology referral for possible intralesional steroid injections      Health Maintenance Due   Topic Date Due    Varicella vaccine (1 of 2 - 13+ 2-dose series) Never done    Hepatitis B vaccine (1 of 3 - 19+ 3-dose series) Never done    DTaP/Tdap/Td vaccine (1 - Tdap) Never done    Breast cancer screen  Never done    Flu vaccine (1) Never done    COVID-19 Vaccine (1 - 2024-25 season) Never done       Attending Physician Statement  I have discussed the case, including pertinent history and exam findings with the resident. I also have seen the patient and performed key portions of the examination.  I agree with the documented assessment and plan as documented by the resident.  GC  Anda Latina., DO 01/24/2024 12:09 PM

## 2024-07-17 NOTE — Patient Instructions (Signed)
 [place] [employer] - Financial Resources*  (Please call [address]/211 if need more resources)    Utility  [employer]:  What they do: Help pay rent, utilities & internet bills.  Phone Number: 779-820-6121  Website: UKRank.hu    [employer] [address] [place]:  What they do: They offer Utility assistance  Phone Number: 365-233-0821   Website: https://[place]astern[place].salvationarmy.org/[place]/equip-families    Medical  Alzheimer's Association: 769-152-4129  American Cancer Society: (787) 628-6603  American Heart Association: 984-719-8467  American Lung Association: 862-799-2535  Arthritis Foundation: 225-347-0304  Bureau of Services for Visually Impaired: 929-040-5579  Kidney Foundation: 347-093-7120     [facility]:  What they offer: Assist in finding resources to help pay hospital bills.  Phone Number: 256 121 0281  Website: https://www..com/patient-resources/financial-assistance    [facility] Job & Family Services:  What they offer: Medical coverage assistance for children, pregnant women, elderly, individuals with disabilities and those looking for long term care and/or in home waiver care.   Phone Number:  713 572 1354  Website: https://acjfs.org/services/medical-assistance    [facility] [employer] on Aging:  What they offer: Aging and disability resource center, care management/coordination, transportation, wellness and prevention.  Phone Number: (419)502-1359          [employer]:  What they offer: Temporary cash assistance to families to pay immediate needs while the adults of the families prepare and search for jobs.   Phone Number: 623-807-3615  Website: https://acjfs.org/services/cash-assistance               [employer]*  (Call [employer]/211 if need more resources.)    Home Delivered Meals:  [place] Meals: 862-348-2381 for people and pets  [employer] Bound Delivered Meals:  Phone: 4156230485  [facility] only):  Phone: 540-428-2547  Moms Meals:  What they offer: Nutritious refrigerated meals. Available to individuals with Medicare advantage plans, Medicaid plans, Long-Term services and supports (LTSS) programs and self-pay at $7.99 a meal.  Renal-Friendly, pureed and Gluten free at $8.99 a meal plus flat rate shipping.   Phone: (802)629-6968  Website: https://www.momsmeals.com    Senior Nutrition program:  Phone: (703)074-1020  [employer] Home Delivered Meals ([place]):  What they offer: Meals for seniors, disabled, recovering from an injury or illness on [place]. Both privately and through homecare services like passport and my care [place].   Phone: (437) 376-1829  Website: https://simplyez.com    Other Food Resources:  [employer] on Wheels) Development worker, international aid on Aging   Phone: 641-079-6566 or 915-578-4281    [facility]- Nutrition Program   What they offer: Nutrition education and nutritious foods for Pregnant women, women who just had a baby, breastfeeding moms, infants and children up to the age of 5.   Phone: 208-834-2668   Website: https://www.allencountypublichealth.org  [employer]- Provides Meals  What they offer: Provide food boxes to those in need monthly.  Phone: 662-022-9204  Website: http://www.dixon.info/  [employer]   What they offer:  Provides a 3-day supply of food for individuals once a month.   Phone: (563)545-8387  Website: ShavedPoints.is  [facility] [employer]-Meal Site   Phone: 415 750 7190  [facility] [employer]- Meal Site  Phone: (380)768-2014  [employer]:  What they offer: Provides continental breakfast and hot lunch   Phone: 8546818701  Website: https://odbread.org   [employer]:  What they offer: Pantry and other services   Phone: 773-882-5913  Website: https://easternusa.salvationarmy.org/[place]-/[place]/  [facility] Food [facility]:  What they offer: Food/groceries.  Contact: 815-060-4975; [address], [place], [place] [address]  Helpful Info: Open after the 10th of the month on Monday, Wednesday, and Friday from 1:00pm to 3:00pm.   [employer]  What they offer: Multiple resources available. Open Monday - Saturday 12:00pm - 5:00pm  Contact: [address] [place], [place] [address]; (774) [phone]    [facility] Food Pantry:  What they offer: Last two Saturdays of month they open 9:00am -12:00 pm  Contact: (757)110-8221; [address] [place], [place]   [place] Food Pantry:   What they offer: Third Thursday each month they open 5:00p - 7:00p  Contact: [employer], [address] [place] [place], [place] [address]  (347)[phone]  [facility]:  What they offer: Food Pantry   When: Wed and Fri 1p-4p   Phone: (959)534-0509  [facility] Food Bank:  What they offer:  Provides commodities to surrounding pantries and soup kitchens  Phone: (513)493-8166  Website: https://wofb.org   SNAP:  What they offer:  A card used like cash to buy eligible food items from authorized   retailers.  Apply online: https://ssp.benefits.http://case.info/.jsp
